# Patient Record
Sex: Female | Born: 1960 | Race: Black or African American | Hispanic: No | Marital: Married | Smoking: Never smoker
Health system: Southern US, Community
[De-identification: ages and names within clinical notes are randomized; demographics above are authoritative.]

## PROBLEM LIST (undated history)

## (undated) DIAGNOSIS — I10 Essential (primary) hypertension: Secondary | ICD-10-CM

## (undated) DIAGNOSIS — J45909 Unspecified asthma, uncomplicated: Secondary | ICD-10-CM

## (undated) HISTORY — PX: COLONOSCOPY: SHX174

## (undated) HISTORY — PX: ABDOMINAL HYSTERECTOMY: SHX81

## (undated) HISTORY — PX: CARPAL TUNNEL RELEASE: SHX101

---

## 2003-12-27 ENCOUNTER — Ambulatory Visit: Payer: Self-pay

## 2006-12-08 ENCOUNTER — Ambulatory Visit: Payer: Self-pay | Admitting: Family Medicine

## 2007-05-11 ENCOUNTER — Ambulatory Visit: Payer: Self-pay | Admitting: Family Medicine

## 2008-05-11 ENCOUNTER — Ambulatory Visit: Payer: Self-pay | Admitting: Family Medicine

## 2009-08-02 ENCOUNTER — Ambulatory Visit: Payer: Self-pay | Admitting: Family Medicine

## 2012-06-15 ENCOUNTER — Emergency Department: Payer: Self-pay | Admitting: Emergency Medicine

## 2013-05-16 ENCOUNTER — Ambulatory Visit: Payer: Self-pay | Admitting: General Practice

## 2014-04-16 ENCOUNTER — Emergency Department: Payer: Self-pay | Admitting: Emergency Medicine

## 2014-04-16 LAB — CBC WITH DIFFERENTIAL/PLATELET
BASOS PCT: 0.9 %
Basophil #: 0 10*3/uL (ref 0.0–0.1)
Eosinophil #: 0.2 10*3/uL (ref 0.0–0.7)
Eosinophil %: 4.3 %
HCT: 44.8 % (ref 35.0–47.0)
HGB: 14.4 g/dL (ref 12.0–16.0)
Lymphocyte #: 1.2 10*3/uL (ref 1.0–3.6)
Lymphocyte %: 24.7 %
MCH: 26.9 pg (ref 26.0–34.0)
MCHC: 32.1 g/dL (ref 32.0–36.0)
MCV: 84 fL (ref 80–100)
Monocyte #: 0.5 x10 3/mm (ref 0.2–0.9)
Monocyte %: 10.1 %
NEUTROS ABS: 3 10*3/uL (ref 1.4–6.5)
Neutrophil %: 60 %
PLATELETS: 255 10*3/uL (ref 150–440)
RBC: 5.34 10*6/uL — ABNORMAL HIGH (ref 3.80–5.20)
RDW: 14.2 % (ref 11.5–14.5)
WBC: 5 10*3/uL (ref 3.6–11.0)

## 2014-04-16 LAB — BASIC METABOLIC PANEL
Anion Gap: 8 (ref 7–16)
BUN: 21 mg/dL — ABNORMAL HIGH (ref 7–18)
Calcium, Total: 9.4 mg/dL (ref 8.5–10.1)
Chloride: 104 mmol/L (ref 98–107)
Co2: 26 mmol/L (ref 21–32)
Creatinine: 1.33 mg/dL — ABNORMAL HIGH (ref 0.60–1.30)
EGFR (Non-African Amer.): 45 — ABNORMAL LOW
GFR CALC AF AMER: 54 — AB
Glucose: 97 mg/dL (ref 65–99)
OSMOLALITY: 279 (ref 275–301)
Potassium: 3.9 mmol/L (ref 3.5–5.1)
SODIUM: 138 mmol/L (ref 136–145)

## 2014-04-16 LAB — URINALYSIS, COMPLETE
Blood: NEGATIVE
GLUCOSE, UR: NEGATIVE mg/dL (ref 0–75)
Hyaline Cast: 7
Nitrite: NEGATIVE
Ph: 5 (ref 4.5–8.0)
Protein: 100
SPECIFIC GRAVITY: 1.031 (ref 1.003–1.030)
Squamous Epithelial: 15
WBC UR: 192 /HPF (ref 0–5)

## 2014-04-16 LAB — TROPONIN I

## 2014-04-18 LAB — URINE CULTURE

## 2014-12-02 ENCOUNTER — Encounter: Payer: Self-pay | Admitting: Emergency Medicine

## 2014-12-02 ENCOUNTER — Emergency Department
Admission: EM | Admit: 2014-12-02 | Discharge: 2014-12-02 | Disposition: A | Discharge status: Home / Self Care | Payer: 59 | Attending: Emergency Medicine | Admitting: Emergency Medicine

## 2014-12-02 ENCOUNTER — Emergency Department: Payer: 59

## 2014-12-02 DIAGNOSIS — Z79899 Other long term (current) drug therapy: Secondary | ICD-10-CM | POA: Diagnosis not present

## 2014-12-02 DIAGNOSIS — I10 Essential (primary) hypertension: Secondary | ICD-10-CM | POA: Diagnosis not present

## 2014-12-02 DIAGNOSIS — R079 Chest pain, unspecified: Secondary | ICD-10-CM | POA: Diagnosis present

## 2014-12-02 DIAGNOSIS — R109 Unspecified abdominal pain: Secondary | ICD-10-CM | POA: Insufficient documentation

## 2014-12-02 DIAGNOSIS — M5441 Lumbago with sciatica, right side: Secondary | ICD-10-CM

## 2014-12-02 HISTORY — DX: Essential (primary) hypertension: I10

## 2014-12-02 HISTORY — DX: Unspecified asthma, uncomplicated: J45.909

## 2014-12-02 LAB — URINALYSIS COMPLETE WITH MICROSCOPIC (ARMC ONLY)
BACTERIA UA: NONE SEEN
Bilirubin Urine: NEGATIVE
Glucose, UA: NEGATIVE mg/dL
HGB URINE DIPSTICK: NEGATIVE
Ketones, ur: NEGATIVE mg/dL
LEUKOCYTES UA: NEGATIVE
NITRITE: NEGATIVE
PROTEIN: NEGATIVE mg/dL
SPECIFIC GRAVITY, URINE: 1.02 (ref 1.005–1.030)
pH: 6 (ref 5.0–8.0)

## 2014-12-02 LAB — COMPREHENSIVE METABOLIC PANEL
ALBUMIN: 3.7 g/dL (ref 3.5–5.0)
ALT: 15 U/L (ref 14–54)
AST: 17 U/L (ref 15–41)
Alkaline Phosphatase: 57 U/L (ref 38–126)
Anion gap: 7 (ref 5–15)
BILIRUBIN TOTAL: 0.4 mg/dL (ref 0.3–1.2)
BUN: 19 mg/dL (ref 6–20)
CHLORIDE: 108 mmol/L (ref 101–111)
CO2: 23 mmol/L (ref 22–32)
Calcium: 8.2 mg/dL — ABNORMAL LOW (ref 8.9–10.3)
Creatinine, Ser: 0.71 mg/dL (ref 0.44–1.00)
GFR calc Af Amer: >60 mL/min (ref >60)
GFR calc non Af Amer: >60 mL/min (ref >60)
GLUCOSE: 91 mg/dL (ref 65–99)
POTASSIUM: 3.7 mmol/L (ref 3.5–5.1)
Sodium: 138 mmol/L (ref 135–145)
Total Protein: 6.7 g/dL (ref 6.5–8.1)

## 2014-12-02 LAB — TROPONIN I: Troponin I: <0.03 ng/mL (ref <0.031)

## 2014-12-02 LAB — CBC
HEMATOCRIT: 40.3 % (ref 35.0–47.0)
Hemoglobin: 13.3 g/dL (ref 12.0–16.0)
MCH: 27.9 pg (ref 26.0–34.0)
MCHC: 33 g/dL (ref 32.0–36.0)
MCV: 84.5 fL (ref 80.0–100.0)
PLATELETS: 233 10*3/uL (ref 150–440)
RBC: 4.76 MIL/uL (ref 3.80–5.20)
RDW: 14.3 % (ref 11.5–14.5)
WBC: 5.8 10*3/uL (ref 3.6–11.0)

## 2014-12-02 LAB — LIPASE, BLOOD: Lipase: 24 U/L (ref 22–51)

## 2014-12-02 MED ORDER — OXYCODONE-ACETAMINOPHEN 5-325 MG PO TABS
1.0000 | ORAL_TABLET | ORAL | Status: AC
  Administered 2014-12-02: 1 via ORAL
  Filled 2014-12-02: qty 1

## 2014-12-02 MED ORDER — OXYCODONE-ACETAMINOPHEN 5-325 MG PO TABS: 1.0000 | ORAL_TABLET | Freq: Four times a day (QID) | ORAL | Status: DC | PRN

## 2014-12-02 MED ORDER — IOHEXOL 350 MG/ML SOLN
125.0000 mL | Freq: Once | INTRAVENOUS | Status: AC | PRN
  Administered 2014-12-02: 125 mL via INTRAVENOUS

## 2014-12-02 MED ORDER — IBUPROFEN 600 MG PO TABS
600.0000 mg | ORAL_TABLET | ORAL | Status: AC
  Administered 2014-12-02: 600 mg via ORAL
  Filled 2014-12-02: qty 1

## 2014-12-02 MED ORDER — PREDNISONE 20 MG PO TABS: 40.0000 mg | ORAL_TABLET | Freq: Every day | ORAL | Status: DC

## 2014-12-02 NOTE — ED Notes (Signed)
Peripheral IV with 20g stick attempted x4, unable to obtain access. Ultrasound assisted access attempted x3 with no success. Pt tolerated well.

## 2014-12-02 NOTE — ED Notes (Signed)
Patient to CT via stretcher.

## 2014-12-02 NOTE — ED Provider Notes (Addendum)
[facility] Emergency Department Provider Note REMINDER - THIS NOTE IS NOT A FINAL MEDICAL RECORD UNTIL IT IS SIGNED. UNTIL THEN, THE CONTENT BELOW MAY REFLECT INFORMATION FROM A DOCUMENTATION TEMPLATE, NOT THE ACTUAL PATIENT VISIT. ____________________________________________  Time seen: Approximately 1:57 PM  I have reviewed the triage vital signs and the nursing notes.   HISTORY  Chief Complaint Flank Pain and Chest Pain    HPI April George is a 54 y.o. female who reports that she woke up this morning about 8:00 with a pain that was in her right lower back that radiates towards her right flank. Then while going to church she noticed that she is starting to develop some tingling and a shooting pain down the right leg with a slight feeling of numbness in the right foot. Denies a cold or blue foot. She denies any nausea or vomiting. No chest pain, fevers or chills. She reports that she is in otherwise good health except for history of high blood pressure and asthma.  She does report that she feels as if the pain started more in her flank and has now moved down towards her right lower back. She continues to notice slight tingling sensation without weakness in the right leg.   Past Medical History  Diagnosis Date  . Hypertension   . Asthma     There are no active problems to display for this patient.   Past Surgical History  Procedure Laterality Date  . Abdominal hysterectomy      Current Outpatient Rx  Name  Route  Sig  Dispense  Refill  . albuterol (PROVENTIL HFA;VENTOLIN HFA) 108 (90 BASE) MCG/ACT inhaler   Inhalation   Inhale 2 puffs into the lungs every 6 (six) hours as needed for wheezing or shortness of breath.         . desloratadine (CLARINEX REDITAB) 5 MG disintegrating tablet   Oral   Take 5 mg by mouth daily as needed.         Marland Kitchen lisinopril (PRINIVIL,ZESTRIL) 20 MG tablet   Oral   Take 20 mg by mouth daily.            Allergies Codeine  History reviewed. No pertinent family history.  Social History Social History  Substance Use Topics  . Smoking status: Never Smoker   . Smokeless tobacco: Never Used  . Alcohol Use: No    Review of Systems Constitutional: No fever/chills Eyes: No visual changes. ENT: No sore throat. Cardiovascular: Denies chest pain. Respiratory: Denies shortness of breath. Gastrointestinal: No abdominal pain but states the pain is kind of in the area of the right flank sort of around her kidney and radiates down towards her lower back and into the right thigh.  No nausea, no vomiting.  No diarrhea.  No constipation. Genitourinary: Negative for dysuria. Musculoskeletal: Negative for back pain. Skin: Negative for rash. Neurological: Negative for headaches, focal weakness. No speech changes. No headache. No slurring of her speech. No facial droop. No numbness weakness or tingling in the arms or left leg. Denies any dense numbness.  10-point ROS otherwise negative.  ____________________________________________   PHYSICAL EXAM:  VITAL SIGNS: ED Triage Vitals  Enc Vitals Group     BP 12/02/14 1226 152/87 mmHg     Pulse Rate 12/02/14 1226 84     Resp 12/02/14 1226 20     Temp 12/02/14 1226 98.6 F (37 C)     Temp Source 12/02/14 1226 Oral  SpO2 12/02/14 1226 95 %     Weight 12/02/14 1226 255 lb (115.667 kg)     Height 12/02/14 1226 5\' 6"  (1.676 m)     Head Cir --      Peak Flow --      Pain Score 12/02/14 1223 10     Pain Loc --      Pain Edu? --      Excl. in [place]? --    Constitutional: Alert and oriented. Well appearing and in no acute distress. Eyes: Conjunctivae are normal. PERRL. EOMI. Head: Atraumatic. Nose: No congestion/rhinnorhea. Mouth/Throat: Mucous membranes are moist.  Oropharynx non-erythematous. Neck: No stridor.   Cardiovascular: Normal rate, regular rhythm. Grossly normal heart sounds.  Good peripheral circulation. Respiratory: Normal  respiratory effort.  No retractions. Lungs CTAB. Gastrointestinal: Soft and nontender. No distention. No abdominal bruits. No CVA tenderness. Previous hysterectomy Musculoskeletal: No lower extremity tenderness nor edema.  No joint effusions. Patient does have moderate tenderness of the mid lower lumbar spine to palpation which does seem to reproduce the pain radiating down the right leg. Neurologic:  Normal speech and language. No gross focal neurologic deficits are appreciated. No gait instability. Skin:  Skin is warm, dry and intact. No rash noted. Psychiatric: Mood and affect are normal. Speech and behavior are normal.  ____________________________________________   LABS (all labs ordered are listed, but only abnormal results are displayed)  Labs Reviewed  COMPREHENSIVE METABOLIC PANEL - Abnormal; Notable for the following:    Calcium 8.2 (*)    All other components within normal limits  URINALYSIS COMPLETEWITH MICROSCOPIC ([facility] ONLY) - Abnormal; Notable for the following:    Color, Urine YELLOW (*)    APPearance CLEAR (*)    Squamous Epithelial / LPF 0-5 (*)    All other components within normal limits  CBC  TROPONIN I  LIPASE, BLOOD   ____________________________________________  EKG  Reviewed and interpreted by me Normal sinus rhythm There is evidence of LVH by voltage criteria There is slight J-point elevation noted in V2 and V3, likely secondary to left ventricular hypertrophy No obvious acute ischemic changes Normal sinus rhythm QTC 450 PR 170 QRS 85 ____________________________________________  RADIOLOGY  IMPRESSION: No acute abnormality. Negative for aortic dissection or acute vascular abnormality. ____________________________________________   PROCEDURES  Procedure(s) performed: None  Critical Care performed: No  ____________________________________________   INITIAL IMPRESSION / ASSESSMENT AND PLAN / ED COURSE  Pertinent labs & imaging  results that were available during my care of the patient were reviewed by me and considered in my medical decision making (see chart for details).  Patient's presentation seems most consistent with back pain, possible herniation versus sciatica given it runs down from the lower lumbar spine in the back leg with some paresthesia in the right foot. She is neurologically intact except notes for a slight decrease in sensation over the right medial foot. She has normal strength. I am somewhat concerned and the fact that she does report that she is having associated flank pain as well, and this is somewhat unusual. Consideration but seemingly far less likely would be aortic dissection or other intra-abdominal process. We'll order a CT angiography to evaluate further. Regarding the back, she has no bowel or bladder incontinence, she has no symptoms of cauda equina.  Chest the patient's CT angiography did not reveal an obvious etiology, I would consider this to be most likely lumbar herniation or disc disease/stenosis possibly causing today's pain.  ----------------------------------------- 4:04 PM on 12/02/2014 -----------------------------------------  Patient reports her pain is improved but still having moderate pain in the lower back. We'll give an additional Percocet, I was able to place a 20-gauge IV under ultrasound guidance using standard technique and precautions into the [facility] without complication. Draws and flushes well.  ----------------------------------------- 5:39 PM on 12/02/2014 -----------------------------------------  Patient's laboratory evaluation and CT scan are very reassuring. Troponin is normal. She reports much improvement after Percocet. No signs of acute need for intervention, I suspect she may have low back pain which is secondary to a possible disc type disease, as her CT is very reassuring. I will discharge the patient to home, provide a brief prescription of Percocet and  advised close follow-up. I did discuss with the patient if she develops any numbness that is dense, weakness in the leg, bowel or bladder problems, or severe increase in pain that she needs to come back to the emergency room right away. She is very agreeable. ____________________________________________   FINAL CLINICAL IMPRESSION(S) / ED DIAGNOSES  Final diagnoses:  None      Delman Kitten, MD 12/02/14 1741  I will prescribe the patient a narcotic pain medicine due to their condition which I anticipate will cause at least moderate pain short term. I discussed with the patient safe use of narcotic pain medicines, and that they are not to drive, work in dangerous areas, or ever take more than prescribed (no more than 1 pill every 6 hours). We discussed that this is the type of medication that "Alfonse Spruce" may have overdosed on and the risks of this type of medicine. Patient is very agreeable to only use as prescribed and to never use more than prescribed.   Delman Kitten, MD 12/02/14 [id number]

## 2014-12-02 NOTE — Discharge Instructions (Signed)
You have been seen in the Emergency Department (ED)  today for back pain.  Your workup and exam have not shown any acute abnormalities and you are likely suffering from muscle strain or possible problems with your discs, but there is no treatment that will fix your symptoms at this time.  Please take Motrin (ibuprofen) as needed for your pain according to the instructions written on the box.  Alternatively, for the next five days you can take 600mg  three times daily with meals (it may upset your stomach).  Take percocet as prescribed for severe pain. Do not drink alcohol, drive or participate in any other potentially dangerous activities while taking this medication as it may make you sleepy. Do not take this medication with any other sedating medications, either prescription or over-the-counter. If you were prescribed Percocet or Vicodin, do not take these with acetaminophen (Tylenol) as it is already contained within these medications.   This medication is an opiate (or narcotic) pain medication and can be habit forming.  Use it as little as possible to achieve adequate pain control.  Do not use or use it with extreme caution if you have a history of opiate abuse or dependence.  If you are on a pain contract with your primary care doctor or a pain specialist, be sure to let them know you were prescribed this medication today from the [facility] Emergency Department.  This medication is intended for your use only - do not give any to anyone else and keep it in a secure place where nobody else, especially children, have access to it.  It will also cause or worsen constipation, so you may want to consider taking an over-the-counter stool softener while you are taking this medication.  Please follow up with your doctor as soon as possible regarding today's ED visit and your back pain.  Return to the ED for worsening back pain, fever, weakness or numbness of either leg, or if you develop either (1) an  inability to urinate or have bowel movements, or (2) loss of your ability to control your bathroom functions (if you start having "accidents"), or if you develop other new symptoms that concern you.   Radicular Pain Radicular pain in either the arm or leg is usually from a bulging or herniated disk in the spine. A piece of the herniated disk may press against the nerves as the nerves exit the spine. This causes pain which is felt at the tips of the nerves down the arm or leg. Other causes of radicular pain may include:  Fractures.  Heart disease.  Cancer.  An abnormal and usually degenerative state of the nervous system or nerves (neuropathy). Diagnosis may require CT or MRI scanning to determine the primary cause.  Nerves that start at the neck (nerve roots) may cause radicular pain in the outer shoulder and arm. It can spread down to the thumb and fingers. The symptoms vary depending on which nerve root has been affected. In most cases radicular pain improves with conservative treatment. Neck problems may require physical therapy, a neck collar, or cervical traction. Treatment may take many weeks, and surgery may be considered if the symptoms do not improve.  Conservative treatment is also recommended for sciatica. Sciatica causes pain to radiate from the lower back or buttock area down the leg into the foot. Often there is a history of back problems. Most patients with sciatica are better after 2 to 4 weeks of rest and other supportive care. Short  term bed rest can reduce the disk pressure considerably. Sitting, however, is not a good position since this increases the pressure on the disk. You should avoid bending, lifting, and all other activities which make the problem worse. Traction can be used in severe cases. Surgery is usually reserved for patients who do not improve within the first months of treatment. Only take over-the-counter or prescription medicines for pain, discomfort, or fever as  directed by your caregiver. Narcotics and muscle relaxants may help by relieving more severe pain and spasm and by providing mild sedation. Cold or massage can give significant relief. Spinal manipulation is not recommended. It can increase the degree of disc protrusion. Epidural steroid injections are often effective treatment for radicular pain. These injections deliver medicine to the spinal nerve in the space between the protective covering of the spinal cord and back bones (vertebrae). Your caregiver can give you more information about steroid injections. These injections are most effective when given within two weeks of the onset of pain.  You should see your caregiver for follow up care as recommended. A program for neck and back injury rehabilitation with stretching and strengthening exercises is an important part of management.  SEEK IMMEDIATE MEDICAL CARE IF:  You develop increased pain, weakness, or numbness in your arm or leg.  You develop difficulty with bladder or bowel control.  You develop abdominal pain. Document Released: 04/16/2004 Document Revised: 06/01/2011 Document Reviewed: 07/02/2008 [facility] Patient Information 2015 [place], [place]. This information is not intended to replace advice given to you by your health care provider. Make sure you discuss any questions you have with your health care provider.

## 2014-12-02 NOTE — ED Notes (Signed)
Pt c/o R sided flank pain that started at approx 0800. Pt states pain radiates to back and into chest. Pt alert and oriented at this time. Denies hx heart disease, denies hx of kidney stones. Pt tense and unable to get comfortable in triage. Pt alert and oriented at this time.

## 2016-02-24 ENCOUNTER — Other Ambulatory Visit: Payer: Self-pay | Admitting: Nurse Practitioner

## 2016-02-24 DIAGNOSIS — Z1231 Encounter for screening mammogram for malignant neoplasm of breast: Secondary | ICD-10-CM

## 2016-04-01 ENCOUNTER — Ambulatory Visit
Admission: RE | Admit: 2016-04-01 | Discharge: 2016-04-01 | Disposition: A | Discharge status: Home / Self Care | Payer: 59 | Source: Ambulatory Visit | Attending: Nurse Practitioner | Admitting: Nurse Practitioner

## 2016-04-01 DIAGNOSIS — Z1231 Encounter for screening mammogram for malignant neoplasm of breast: Secondary | ICD-10-CM | POA: Diagnosis not present

## 2016-05-29 ENCOUNTER — Other Ambulatory Visit: Payer: Self-pay | Admitting: Nurse Practitioner

## 2016-05-29 ENCOUNTER — Ambulatory Visit: Payer: 59

## 2016-05-29 DIAGNOSIS — M542 Cervicalgia: Secondary | ICD-10-CM

## 2016-05-29 DIAGNOSIS — M25511 Pain in right shoulder: Secondary | ICD-10-CM

## 2016-06-02 ENCOUNTER — Ambulatory Visit
Admission: RE | Admit: 2016-06-02 | Discharge: 2016-06-02 | Disposition: A | Discharge status: Home / Self Care | Payer: 59 | Source: Ambulatory Visit | Attending: Nurse Practitioner | Admitting: Nurse Practitioner

## 2016-06-02 DIAGNOSIS — M542 Cervicalgia: Secondary | ICD-10-CM | POA: Diagnosis present

## 2016-06-02 DIAGNOSIS — M19011 Primary osteoarthritis, right shoulder: Secondary | ICD-10-CM | POA: Insufficient documentation

## 2016-06-02 DIAGNOSIS — M25511 Pain in right shoulder: Secondary | ICD-10-CM | POA: Diagnosis present

## 2016-09-11 IMAGING — CT CT CTA ABD/PEL W/CM AND/OR W/O CM
1 series · 15 of 32 positions shown, 19 images · IV contrast (omnipaque)
Comparison: None.

CLINICAL DATA: Low back and RIGHT flank pain. Paresthesias of the
RIGHT foot. Aortic dissection.

EXAM:
CTA ABDOMEN AND PELVIS WITHOUT CONTRAST
TECHNIQUE: Multidetector CT imaging of the abdomen and pelvis was performed
using the standard protocol during bolus administration of
intravenous contrast. Multiplanar reconstructed images and MIPs were
obtained and reviewed to evaluate the vascular anatomy.
CONTRAST:  125mL OMNIPAQUE IOHEXOL 350 MG/ML SOLN

[Series 4: cta abdpel · axial · 0.76mm/px · z∈[-378,+40]mm · 15 of 232 slices shown, 19 images]
[im 15/232  soft-tissue]
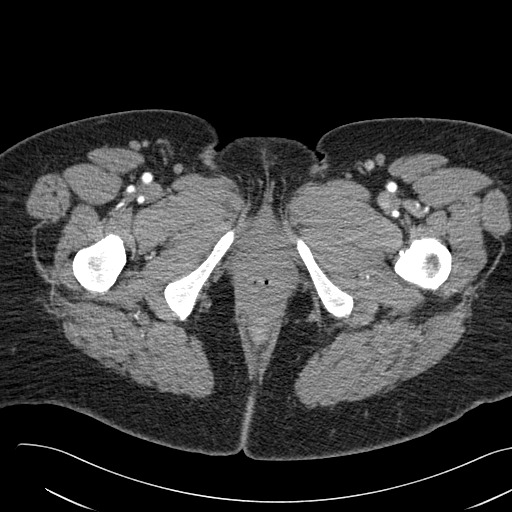
[im 15/232  bone]
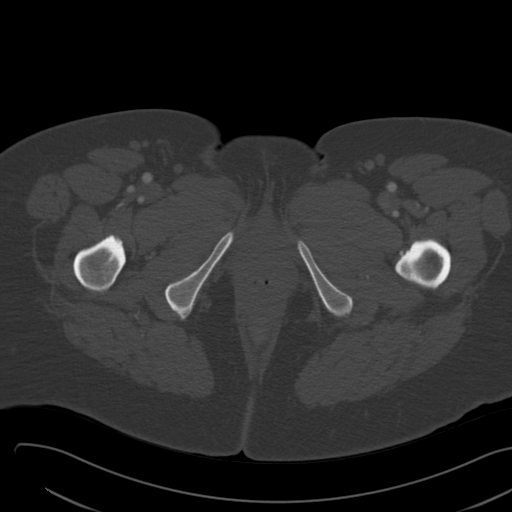
[im 30/232  soft-tissue]
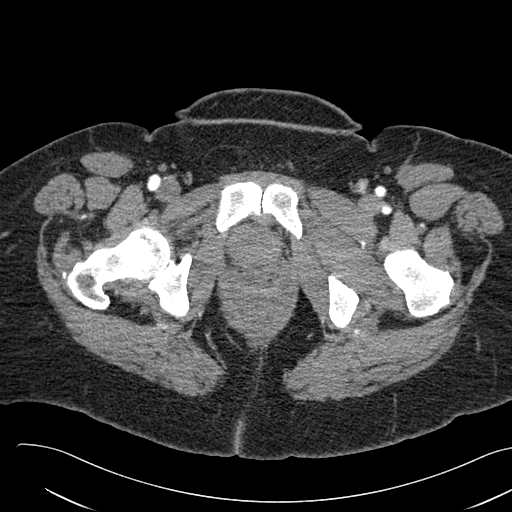
[im 45/232  soft-tissue]
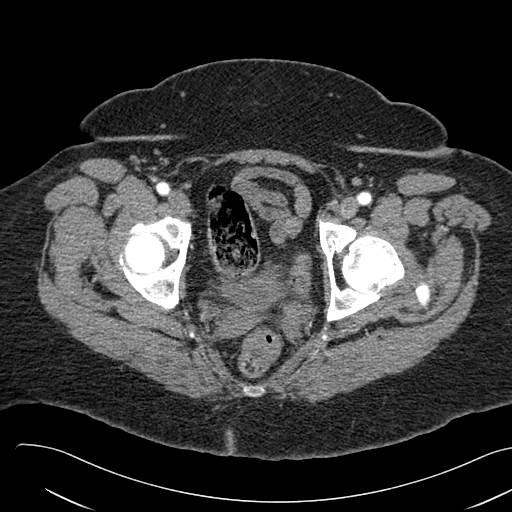
[im 68/232  soft-tissue]
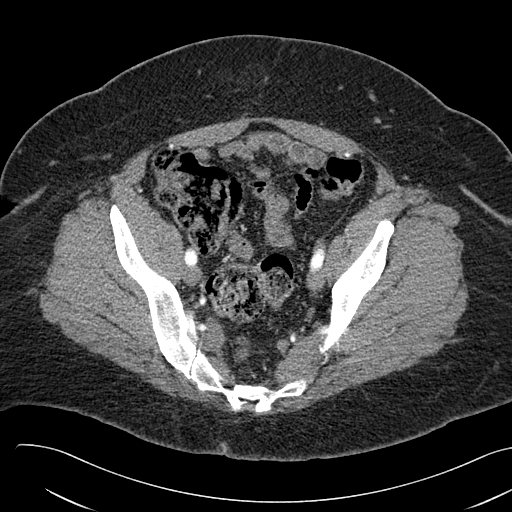
[im 82/232  soft-tissue]
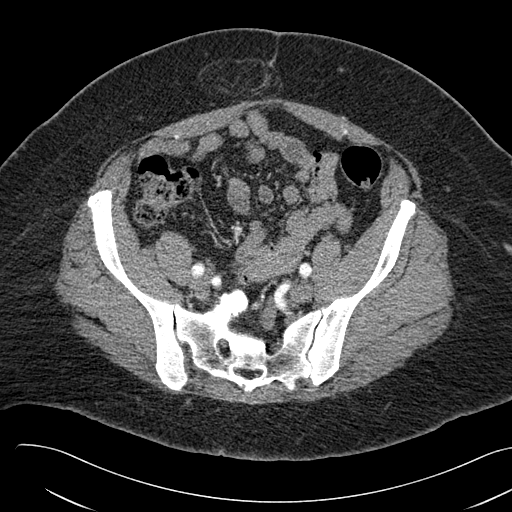
[im 97/232  soft-tissue]
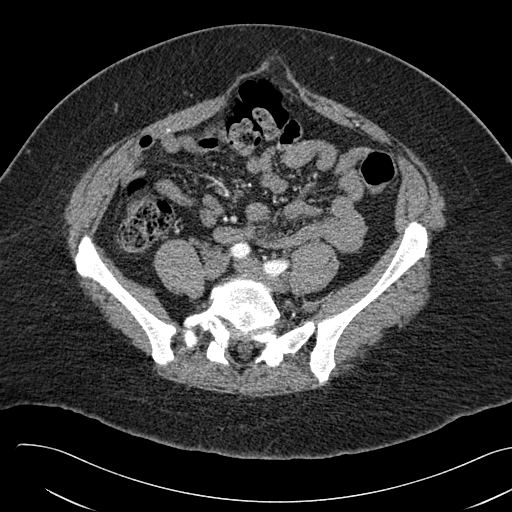
[im 120/232  soft-tissue]
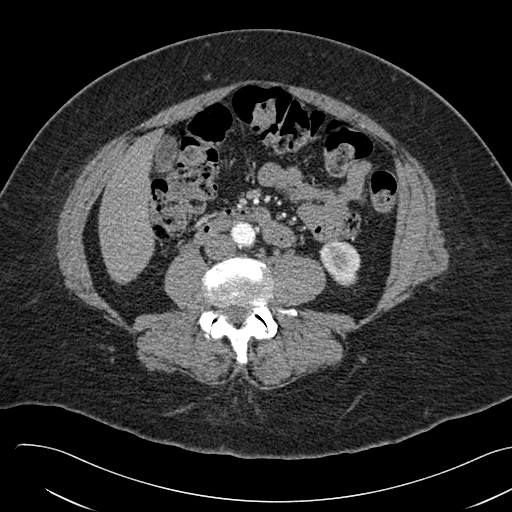
[im 135/232  soft-tissue]
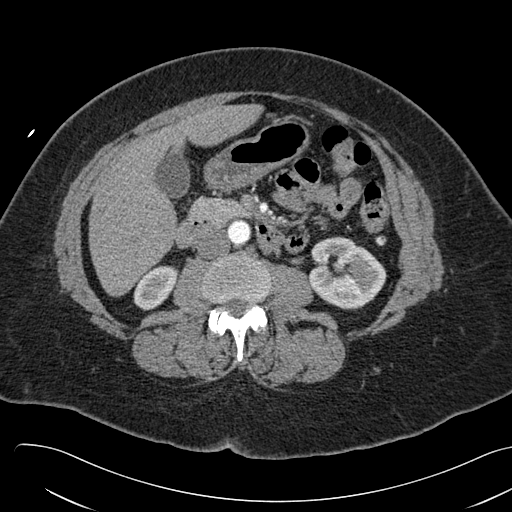
[im 150/232  soft-tissue]
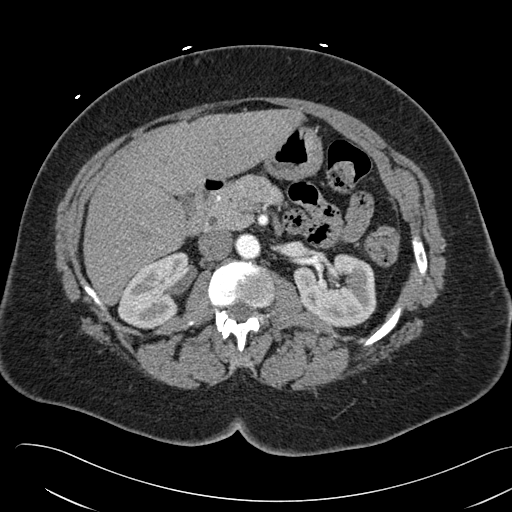
[im 150/232  bone]
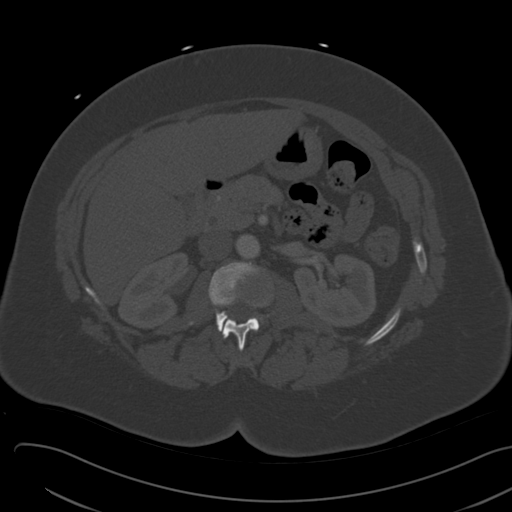
[im 164/232  soft-tissue]
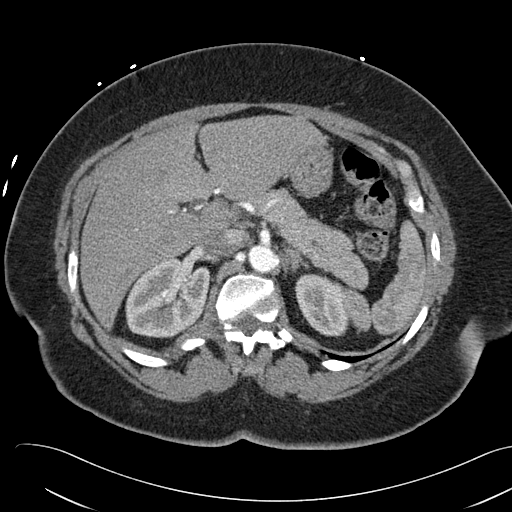
[im 187/232  soft-tissue]
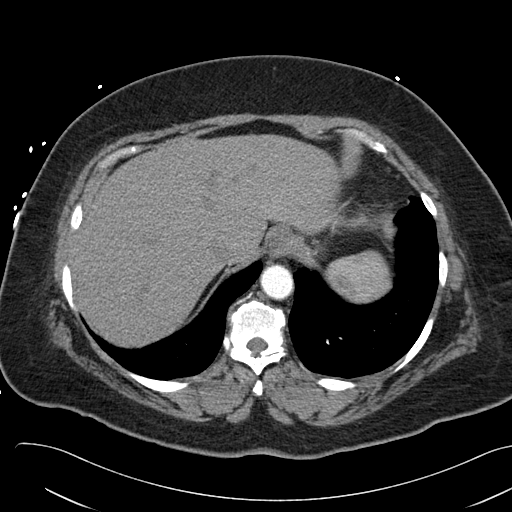
[im 202/232  soft-tissue]
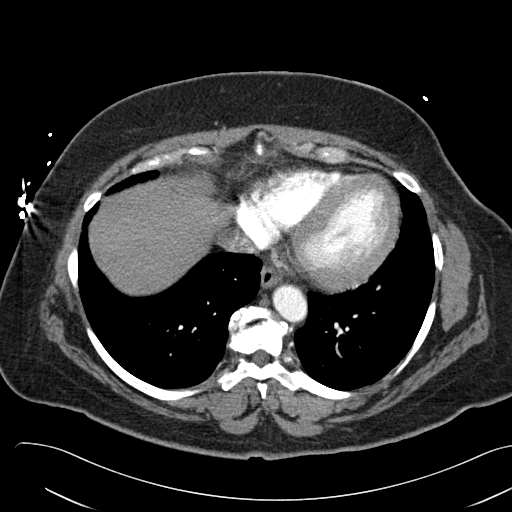
[im 202/232  lung]
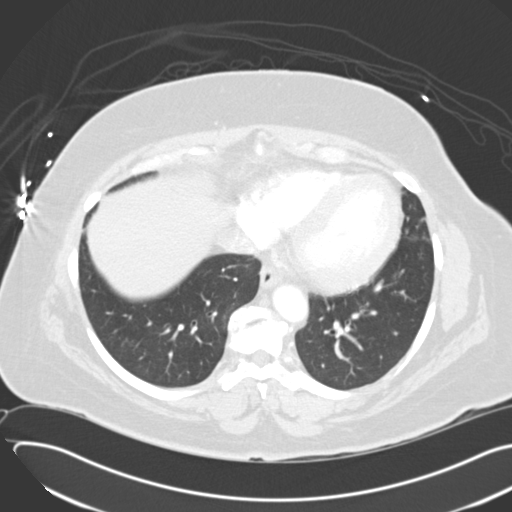
[im 209/232  lung]
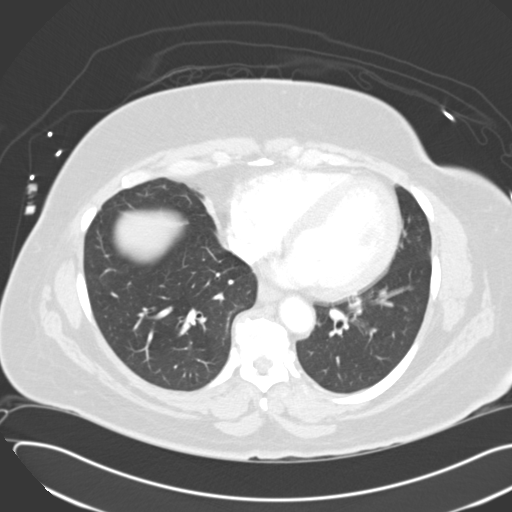
[im 217/232  soft-tissue]
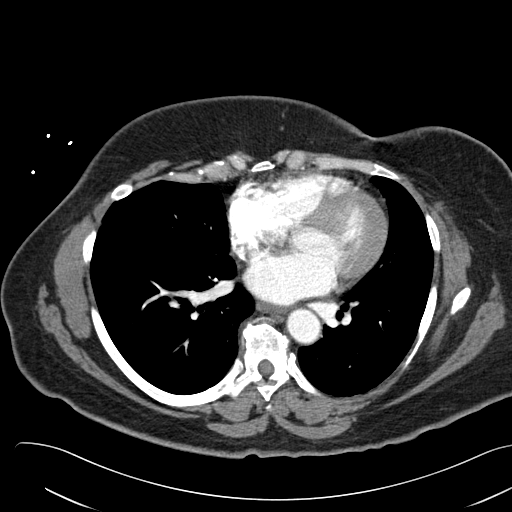
[im 217/232  lung]
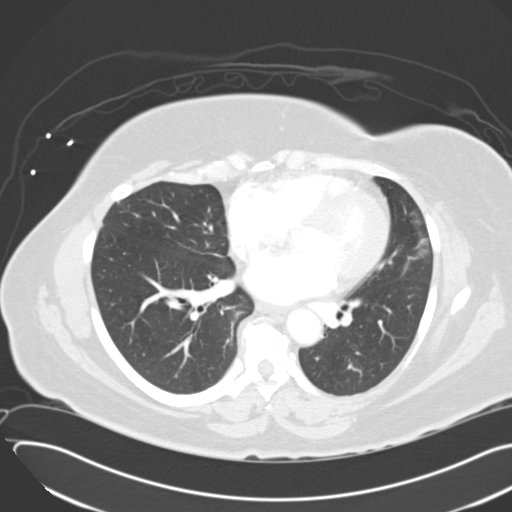
[im 224/232  lung]
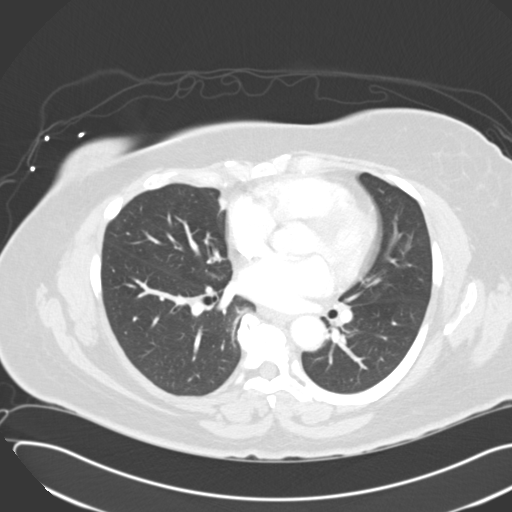

[15 of 32 positions shown; findings below may reference images not displayed]

FINDINGS: Musculoskeletal: Thoracolumbar spondylosis is moderate to severe.
Chronic RIGHT and healed LEFT L5 pars defects. No aggressive osseous
lesions. Dextroconvex thoracolumbar scoliosis.

Lung Bases: Scarring or atelectasis in the lingula.

Liver:  Normal.

Spleen:  Normal.

Gallbladder:  Normal.

Common bile duct:  Normal.

Pancreas:  Normal.

Adrenal glands:  Normal.

Kidneys: Normal enhancement. No calculi. LEFT ureter normal. RIGHT
ureter normal.

Stomach:  Grossly normal, collapsed.

Small bowel:  Normal.

Colon: Normal appendix. No inflammatory changes or obstruction of
the colon. Distal colonic diverticulosis without diverticulitis.

Pelvic Genitourinary: Hysterectomy. Urinary bladder appears normal.

Peritoneum: No free fluid or free air.

Vascular/lymphatic: Normal. Specifically, no acute vascular
abnormality. Negative for aortic dissection.

Body Wall: Fat containing RIGHT inguinal hernia and fat containing
periumbilical hernia.

Review of the MIP images confirms the above findings.
IMPRESSION: No acute abnormality. Negative for aortic dissection or acute
vascular abnormality.

## 2017-06-28 ENCOUNTER — Other Ambulatory Visit: Payer: Self-pay

## 2017-06-28 MED ORDER — LISINOPRIL 20 MG PO TABS: 20.0000 mg | ORAL_TABLET | Freq: Every day | ORAL | 5 refills | Status: DC

## 2017-11-19 ENCOUNTER — Ambulatory Visit: Payer: Managed Care, Other (non HMO) | Admitting: Nurse Practitioner

## 2017-11-19 ENCOUNTER — Encounter: Payer: Self-pay | Admitting: Nurse Practitioner

## 2017-11-19 VITALS — BP 168/104 | HR 91 | Resp 16 | Ht 64.0 in | Wt 291.4 lb

## 2017-11-19 DIAGNOSIS — H1013 Acute atopic conjunctivitis, bilateral: Secondary | ICD-10-CM

## 2017-11-19 DIAGNOSIS — J45909 Unspecified asthma, uncomplicated: Secondary | ICD-10-CM | POA: Insufficient documentation

## 2017-11-19 DIAGNOSIS — J309 Allergic rhinitis, unspecified: Secondary | ICD-10-CM

## 2017-11-19 DIAGNOSIS — I10 Essential (primary) hypertension: Secondary | ICD-10-CM | POA: Diagnosis not present

## 2017-11-19 DIAGNOSIS — J019 Acute sinusitis, unspecified: Secondary | ICD-10-CM | POA: Diagnosis not present

## 2017-11-19 MED ORDER — DESLORATADINE 5 MG PO TBDP: 5.0000 mg | ORAL_TABLET | Freq: Every day | ORAL | 5 refills | Status: DC | PRN

## 2017-11-19 MED ORDER — OLOPATADINE HCL 0.2 % OP SOLN: 1.0000 [drp] | Freq: Every day | OPHTHALMIC | 5 refills | Status: DC

## 2017-11-19 MED ORDER — AMOXICILLIN 875 MG PO TABS: 875.0000 mg | ORAL_TABLET | Freq: Two times a day (BID) | ORAL | 0 refills | Status: DC

## 2017-11-19 MED ORDER — LISINOPRIL 20 MG PO TABS: 20.0000 mg | ORAL_TABLET | Freq: Every day | ORAL | 5 refills | Status: DC

## 2017-11-19 NOTE — Progress Notes (Signed)
[facility] [place], [place] [address]  Internal MEDICINE  Office Visit Note  Patient Name: April George  [id number]  [id number]  Date of Service: 12/01/2017   Pt is here for a sick visit.  Chief Complaint  Patient presents with  . Hypertension    has been out of blood pressure medication for 2 months.   . Sinusitis    severe, contribute to episode of vertigo, near loss of consiousness.      Hypertension  This is a chronic problem. The current episode started more than 1 year ago. The problem is unchanged. The problem is uncontrolled (currently off blood pressure medication). Associated symptoms include headaches. Pertinent negatives include no chest pain, palpitations or shortness of breath. There are no associated agents to hypertension. Risk factors for coronary artery disease include obesity and post-menopausal state. Past treatments include ACE inhibitors. The current treatment provides moderate improvement.  Sinusitis  This is a new problem. The current episode started 1 to 4 weeks ago. The problem has been gradually worsening since onset. There has been no fever. The fever has been present for less than 1 day. Her pain is at a severity of 2/10. The pain is moderate. Associated symptoms include chills, congestion, coughing, ear pain, headaches, a hoarse voice, sinus pressure, sneezing, a sore throat and swollen glands. Pertinent negatives include no shortness of breath. Past treatments include oral decongestants and acetaminophen. The treatment provided mild relief.        Current Medication:  Outpatient Encounter Medications as of 11/19/2017  Medication Sig Note  . albuterol (PROVENTIL HFA;VENTOLIN HFA) 108 (90 BASE) MCG/ACT inhaler Inhale 2 puffs into the lungs every 6 (six) hours as needed for wheezing or shortness of breath.   . desloratadine (CLARINEX REDITAB) 5 MG disintegrating tablet Take 1 tablet (5 mg total) by mouth daily as needed.   Marland Kitchen  lisinopril (PRINIVIL,ZESTRIL) 20 MG tablet Take 1 tablet (20 mg total) by mouth daily.   Marland Kitchen oxyCODONE-acetaminophen (ROXICET) 5-325 MG per tablet Take 1 tablet by mouth every 6 (six) hours as needed for severe pain.   . [DISCONTINUED] desloratadine (CLARINEX REDITAB) 5 MG disintegrating tablet Take 5 mg by mouth daily as needed. 12/02/2014: Received from: External Pharmacy Received Sig:   . [DISCONTINUED] lisinopril (PRINIVIL,ZESTRIL) 20 MG tablet Take 1 tablet (20 mg total) by mouth daily.   Marland Kitchen amoxicillin (AMOXIL) 875 MG tablet Take 1 tablet (875 mg total) by mouth 2 (two) times daily.   . Olopatadine HCl 0.2 % SOLN Apply 1 drop to eye daily.   . [DISCONTINUED] predniSONE (DELTASONE) 20 MG tablet Take 2 tablets (40 mg total) by mouth daily with breakfast. (Patient not taking: Reported on 11/19/2017)    No facility-administered encounter medications on file as of 11/19/2017.       Medical History: Past Medical History:  Diagnosis Date  . Asthma   . Hypertension      Today's Vitals   11/19/17 1547  BP: (!) 168/104  Pulse: 91  Resp: 16  SpO2: 96%  Weight: 291 lb 6.4 oz (132.2 kg)  Height: 5\' 4"  (1.626 m)   Review of Systems  Constitutional: Positive for chills and fatigue. Negative for activity change.  HENT: Positive for congestion, ear pain, hoarse voice, postnasal drip, rhinorrhea, sinus pressure, sinus pain, sneezing, sore throat and voice change.   Eyes: Negative.   Respiratory: Positive for cough and wheezing. Negative for chest tightness and shortness of breath.   Cardiovascular: Negative for  chest pain and palpitations.       Elevated blood pressure.   Gastrointestinal: Negative for constipation, diarrhea, nausea and vomiting.  Endocrine: Negative.  Negative for cold intolerance, heat intolerance, polydipsia, polyphagia and polyuria.  Genitourinary: Negative.   Musculoskeletal: Positive for arthralgias and myalgias.  Skin: Negative.   Allergic/Immunologic: Positive for  environmental allergies.  Neurological: Positive for dizziness and headaches.  Psychiatric/Behavioral: Negative.     Physical Exam  Constitutional: She is oriented to person, place, and time. She appears well-developed and well-nourished. She appears ill. No distress.  HENT:  Head: Normocephalic and atraumatic.  Right Ear: Tympanic membrane is bulging.  Left Ear: Tympanic membrane is bulging.  Nose: Mucosal edema and rhinorrhea present. Right sinus exhibits maxillary sinus tenderness and frontal sinus tenderness. Left sinus exhibits maxillary sinus tenderness and frontal sinus tenderness.  Mouth/Throat: Posterior oropharyngeal erythema present. No oropharyngeal exudate.  Copious post nasal drip evident.   Eyes: Pupils are equal, round, and reactive to light. EOM are normal.  Mild edema present around the orbits of both eyes.   Neck: Normal range of motion. Neck supple. No JVD present. No tracheal deviation present. No thyromegaly present.  Cardiovascular: Normal rate, regular rhythm and normal heart sounds. Exam reveals no gallop and no friction rub.  No murmur heard. Pulmonary/Chest: Effort normal and breath sounds normal. No respiratory distress. She has no wheezes. She has no rales. She exhibits no tenderness.  Abdominal: Soft. Bowel sounds are normal. There is no tenderness.  Musculoskeletal: Normal range of motion.  Lymphadenopathy:    She has cervical adenopathy.  Neurological: She is alert and oriented to person, place, and time. No cranial nerve deficit.  Skin: Skin is warm and dry. Capillary refill takes less than 2 seconds. She is not diaphoretic.  Psychiatric: She has a normal mood and affect. Her behavior is normal. Judgment and thought content normal.  Nursing note and vitals reviewed.  Assessment/Plan: 1. Hypertension, unspecified type Restart lisinopril 20mg  daily. Monitor closely. - lisinopril (PRINIVIL,ZESTRIL) 20 MG tablet; Take 1 tablet (20 mg total) by mouth daily.   Dispense: 30 tablet; Refill: 5  2. Acute non-recurrent sinusitis, unspecified location Start amoxicillin 875mg  twice daily for 10 days. Rest and increase fluids. Take OTC medications to alleviate symptoms. - amoxicillin (AMOXIL) 875 MG tablet; Take 1 tablet (875 mg total) by mouth 2 (two) times daily.  Dispense: 20 tablet; Refill: 0  3. Allergic conjunctivitis of both eyes pataday eye drops. Use 1 deop in both eyes daily.  - Olopatadine HCl 0.2 % SOLN; Apply 1 drop to eye daily.  Dispense: 2.5 mL; Refill: 5  4. Allergic rhinitis, unspecified seasonality, unspecified trigger Start clarinex daily. Avoid triggers.  - desloratadine (CLARINEX REDITAB) 5 MG disintegrating tablet; Take 1 tablet (5 mg total) by mouth daily as needed.  Dispense: 30 tablet; Refill: 5  General Counseling: April George verbalizes understanding of the findings of todays visit and agrees with plan of treatment. I have discussed any further diagnostic evaluation that may be needed or ordered today. We also reviewed her medications today. she has been encouraged to call the office with any questions or concerns that should arise related to todays visit.    Counseling:  Hypertension Counseling:   The following hypertensive lifestyle modification were recommended and discussed:  1. Limiting alcohol intake to less than 1 oz/day of ethanol:(24 oz of beer or 8 oz of wine or 2 oz of 100-proof whiskey). 2. Take baby ASA 81 mg daily. 3. Importance of regular  aerobic exercise and losing weight. 4. Reduce dietary saturated fat and cholesterol intake for overall cardiovascular health. 5. Maintaining adequate dietary potassium, calcium, and magnesium intake. 6. Regular monitoring of the blood pressure. 7. Reduce sodium intake to less than 100 mmol/day (less than 2.3 gm of sodium or less than 6 gm of sodium choride)   Rest and increase fluids. Continue using OTC medication to control symptoms.   This patient was seen by [place] with Dr Lavera Guise as a part of collaborative care agreement  Meds ordered this encounter  Medications  . lisinopril (PRINIVIL,ZESTRIL) 20 MG tablet    Sig: Take 1 tablet (20 mg total) by mouth daily.    Dispense:  30 tablet    Refill:  5    Order Specific Question:   Supervising Provider    Answer:   Lavera Guise [[id number]]  . desloratadine (CLARINEX REDITAB) 5 MG disintegrating tablet    Sig: Take 1 tablet (5 mg total) by mouth daily as needed.    Dispense:  30 tablet    Refill:  5    Order Specific Question:   Supervising Provider    Answer:   Lavera Guise [[id number]]  . Olopatadine HCl 0.2 % SOLN    Sig: Apply 1 drop to eye daily.    Dispense:  2.5 mL    Refill:  5    Order Specific Question:   Supervising Provider    Answer:   Lavera Guise [[id number]]  . amoxicillin (AMOXIL) 875 MG tablet    Sig: Take 1 tablet (875 mg total) by mouth 2 (two) times daily.    Dispense:  20 tablet    Refill:  0    Order Specific Question:   Supervising Provider    Answer:   Lavera Guise [[id number]]    Time spent: 20 Minutes

## 2017-11-25 ENCOUNTER — Telehealth: Payer: Self-pay

## 2017-11-25 ENCOUNTER — Other Ambulatory Visit: Payer: Self-pay | Admitting: Nurse Practitioner

## 2017-11-25 DIAGNOSIS — J019 Acute sinusitis, unspecified: Secondary | ICD-10-CM

## 2017-11-25 MED ORDER — AZITHROMYCIN 250 MG PO TABS: ORAL_TABLET | ORAL | 0 refills | Status: DC

## 2017-11-25 MED ORDER — PREDNISONE 10 MG (21) PO TBPK: ORAL_TABLET | ORAL | 0 refills | Status: DC

## 2017-11-25 NOTE — Progress Notes (Signed)
Will do round of z-pack for 5 days and prednisone taper for 6 days. Both were sent to her pharmacy.

## 2017-11-25 NOTE — Telephone Encounter (Signed)
Will do round of z-pack for 5 days and prednisone taper for 6 days. Both were sent to her pharmacy.

## 2017-11-25 NOTE — Telephone Encounter (Signed)
Pt advised we send antibiotic and prednisone to phar

## 2017-12-01 DIAGNOSIS — H1013 Acute atopic conjunctivitis, bilateral: Secondary | ICD-10-CM | POA: Insufficient documentation

## 2017-12-01 DIAGNOSIS — J309 Allergic rhinitis, unspecified: Secondary | ICD-10-CM | POA: Insufficient documentation

## 2017-12-01 DIAGNOSIS — J019 Acute sinusitis, unspecified: Secondary | ICD-10-CM | POA: Insufficient documentation

## 2017-12-10 ENCOUNTER — Ambulatory Visit: Payer: Self-pay | Admitting: Nurse Practitioner

## 2017-12-22 ENCOUNTER — Encounter: Payer: Self-pay | Admitting: Nurse Practitioner

## 2018-01-14 ENCOUNTER — Ambulatory Visit: Payer: Managed Care, Other (non HMO) | Admitting: Adult Health

## 2018-01-14 ENCOUNTER — Encounter: Payer: Self-pay | Admitting: Adult Health

## 2018-01-14 VITALS — BP 148/92 | HR 83 | Resp 16 | Ht 66.0 in | Wt 295.0 lb

## 2018-01-14 DIAGNOSIS — J4520 Mild intermittent asthma, uncomplicated: Secondary | ICD-10-CM

## 2018-01-14 DIAGNOSIS — Z0289 Encounter for other administrative examinations: Secondary | ICD-10-CM | POA: Diagnosis not present

## 2018-01-14 DIAGNOSIS — Z1211 Encounter for screening for malignant neoplasm of colon: Secondary | ICD-10-CM

## 2018-01-14 DIAGNOSIS — I10 Essential (primary) hypertension: Secondary | ICD-10-CM

## 2018-01-14 NOTE — Patient Instructions (Signed)

## 2018-01-14 NOTE — Progress Notes (Signed)
[facility] [place], [place] [address]  Internal MEDICINE  Office Visit Note  Patient Name: April George  [id number]  [id number]  Date of Service: 01/14/2018  Chief Complaint  Patient presents with  . Hypertension  . Quality Metric Gaps    colonoscopy    HPI Pt here for follow-up on hypertension asthma.  Patient is also in need of her healthcare exemption form for [facility].  She is required to have this filled out due to her BMI.  She also has need for colonoscopy to close her quality metric gaps.  She denies any current issues with her blood pressure or asthma.  She uses a rescue inhaler as needed, and cannot remember the last time she needed her inhaler.   Current Medication: Outpatient Encounter Medications as of 01/14/2018  Medication Sig  . albuterol (PROVENTIL HFA;VENTOLIN HFA) 108 (90 BASE) MCG/ACT inhaler Inhale 2 puffs into the lungs every 6 (six) hours as needed for wheezing or shortness of breath.  . desloratadine (CLARINEX REDITAB) 5 MG disintegrating tablet Take 1 tablet (5 mg total) by mouth daily as needed.  Marland Kitchen lisinopril (PRINIVIL,ZESTRIL) 20 MG tablet Take 1 tablet (20 mg total) by mouth daily.  . [DISCONTINUED] amoxicillin (AMOXIL) 875 MG tablet Take 1 tablet (875 mg total) by mouth 2 (two) times daily. (Patient not taking: Reported on 01/14/2018)  . [DISCONTINUED] azithromycin (ZITHROMAX) 250 MG tablet z-pack - take as directed for 5 days (Patient not taking: Reported on 01/14/2018)  . [DISCONTINUED] Olopatadine HCl 0.2 % SOLN Apply 1 drop to eye daily. (Patient not taking: Reported on 01/14/2018)  . [DISCONTINUED] oxyCODONE-acetaminophen (ROXICET) 5-325 MG per tablet Take 1 tablet by mouth every 6 (six) hours as needed for severe pain. (Patient not taking: Reported on 01/14/2018)  . [DISCONTINUED] predniSONE (STERAPRED UNI-PAK 21 TAB) 10 MG (21) TBPK tablet 6 day taper - take by mouth as directed for 6 days (Patient not taking: Reported on  01/14/2018)   No facility-administered encounter medications on file as of 01/14/2018.     Surgical History: Past Surgical History:  Procedure Laterality Date  . ABDOMINAL HYSTERECTOMY      Medical History: Past Medical History:  Diagnosis Date  . Asthma   . Hypertension     Family History: Family History  Problem Relation Age of Onset  . Breast cancer Mother 75  . Breast cancer Cousin     Social History   Socioeconomic History  . Marital status: Married    Spouse name: Not on file  . Number of children: Not on file  . Years of education: Not on file  . Highest education level: Not on file  Occupational History  . Not on file  Social Needs  . Financial resource strain: Not on file  . Food insecurity:    Worry: Not on file    Inability: Not on file  . Transportation needs:    Medical: Not on file    Non-medical: Not on file  Tobacco Use  . Smoking status: Never Smoker  . Smokeless tobacco: Never Used  Substance and Sexual Activity  . Alcohol use: No  . Drug use: No  . Sexual activity: Not on file  Lifestyle  . Physical activity:    Days per week: Not on file    Minutes per session: Not on file  . Stress: Not on file  Relationships  . Social connections:    Talks on phone: Not on file    Gets together:  Not on file    Attends religious service: Not on file    Active member of club or organization: Not on file    Attends meetings of clubs or organizations: Not on file    Relationship status: Not on file  . Intimate partner violence:    Fear of current or ex partner: Not on file    Emotionally abused: Not on file    Physically abused: Not on file    Forced sexual activity: Not on file  Other Topics Concern  . Not on file  Social History Narrative  . Not on file      Review of Systems  Constitutional: Negative for chills, fatigue and unexpected weight change.  HENT: Negative for congestion, rhinorrhea, sneezing and sore throat.   Eyes: Negative  for photophobia, pain and redness.  Respiratory: Negative for cough, chest tightness and shortness of breath.   Cardiovascular: Negative for chest pain and palpitations.  Gastrointestinal: Negative for abdominal pain, constipation, diarrhea, nausea and vomiting.  Endocrine: Negative.   Genitourinary: Negative for dysuria and frequency.  Musculoskeletal: Negative for arthralgias, back pain, joint swelling and neck pain.  Skin: Negative for rash.  Allergic/Immunologic: Negative.   Neurological: Negative for tremors and numbness.  Hematological: Negative for adenopathy. Does not bruise/bleed easily.  Psychiatric/Behavioral: Negative for behavioral problems and sleep disturbance. The patient is not nervous/anxious.     Vital Signs: BP (!) 148/92   Pulse 83   Resp 16   Ht 5\' 6"  (1.676 m)   Wt 295 lb (133.8 kg)   SpO2 94%   BMI 47.61 kg/m    Physical Exam  Constitutional: She is oriented to person, place, and time. She appears well-developed and well-nourished. No distress.  HENT:  Head: Normocephalic and atraumatic.  Mouth/Throat: Oropharynx is clear and moist. No oropharyngeal exudate.  Eyes: Pupils are equal, round, and reactive to light. EOM are normal.  Neck: Normal range of motion. Neck supple. No JVD present. No tracheal deviation present. No thyromegaly present.  Cardiovascular: Normal rate, regular rhythm and normal heart sounds. Exam reveals no gallop and no friction rub.  No murmur heard. Pulmonary/Chest: Effort normal and breath sounds normal. No respiratory distress. She has no wheezes. She has no rales. She exhibits no tenderness.  Abdominal: Soft. There is no tenderness. There is no guarding.  Musculoskeletal: Normal range of motion.  Lymphadenopathy:    She has no cervical adenopathy.  Neurological: She is alert and oriented to person, place, and time. No cranial nerve deficit.  Skin: Skin is warm and dry. She is not diaphoretic.  Psychiatric: She has a normal mood  and affect. Her behavior is normal. Judgment and thought content normal.  Nursing note and vitals reviewed.   Assessment/Plan: 1. Hypertension, unspecified type Patient's blood pressure slightly elevated at this visit.  Patient has been rushing around trying to get here from work.  We will continue to monitor in the future, especially at physical that is coming up.  2. Mild intermittent asthma without complication Asthma is well controlled, patient has albuterol rescue inhaler to use as needed.  3. Encounter for completion of form with patient [facility] health questionnaire ablated for patient.  General Counseling: kaleiah kutzer understanding of the findings of todays visit and agrees with plan of treatment. I have discussed any further diagnostic evaluation that may be needed or ordered today. We also reviewed her medications today. she has been encouraged to call the office with any questions or concerns that should arise  related to todays visit.    Orders Placed This Encounter  Procedures  . Ambulatory referral to Gastroenterology    No orders of the defined types were placed in this encounter.   Time spent: 25 Minutes   This patient was seen by Orson Gear AGNP-C in Collaboration with Dr Lavera Guise as a part of collaborative care agreement     Kendell Bane AGNP-C Internal medicine

## 2018-01-27 ENCOUNTER — Ambulatory Visit (INDEPENDENT_AMBULATORY_CARE_PROVIDER_SITE_OTHER): Payer: Managed Care, Other (non HMO) | Admitting: Adult Health

## 2018-01-27 ENCOUNTER — Encounter: Payer: Self-pay | Admitting: *Deleted

## 2018-01-27 ENCOUNTER — Encounter: Payer: Self-pay | Admitting: Adult Health

## 2018-01-27 VITALS — BP 150/88 | HR 82 | Resp 16 | Ht 66.0 in | Wt 293.0 lb

## 2018-01-27 DIAGNOSIS — M1990 Unspecified osteoarthritis, unspecified site: Secondary | ICD-10-CM

## 2018-01-27 DIAGNOSIS — I10 Essential (primary) hypertension: Secondary | ICD-10-CM

## 2018-01-27 DIAGNOSIS — Z0001 Encounter for general adult medical examination with abnormal findings: Secondary | ICD-10-CM

## 2018-01-27 DIAGNOSIS — J4520 Mild intermittent asthma, uncomplicated: Secondary | ICD-10-CM

## 2018-01-27 DIAGNOSIS — F419 Anxiety disorder, unspecified: Secondary | ICD-10-CM

## 2018-01-27 DIAGNOSIS — Z1211 Encounter for screening for malignant neoplasm of colon: Secondary | ICD-10-CM

## 2018-01-27 DIAGNOSIS — R300 Dysuria: Secondary | ICD-10-CM

## 2018-01-27 DIAGNOSIS — Z6841 Body Mass Index (BMI) 40.0 and over, adult: Secondary | ICD-10-CM

## 2018-01-27 MED ORDER — CELECOXIB 50 MG PO CAPS: 50.0000 mg | ORAL_CAPSULE | Freq: Two times a day (BID) | ORAL | 2 refills | Status: DC

## 2018-01-27 NOTE — Patient Instructions (Signed)
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
## 2018-01-28 ENCOUNTER — Other Ambulatory Visit: Payer: Self-pay

## 2018-01-28 DIAGNOSIS — Z1211 Encounter for screening for malignant neoplasm of colon: Secondary | ICD-10-CM

## 2018-01-28 LAB — MICROSCOPIC EXAMINATION
Casts: NONE SEEN /lpf
WBC, UA: NONE SEEN /hpf (ref 0–5)

## 2018-01-28 LAB — UA/M W/RFLX CULTURE, ROUTINE
BILIRUBIN UA: NEGATIVE
GLUCOSE, UA: NEGATIVE
KETONES UA: NEGATIVE
Leukocytes, UA: NEGATIVE
NITRITE UA: NEGATIVE
Protein, UA: NEGATIVE
RBC UA: NEGATIVE
Specific Gravity, UA: >=1.03 — AB (ref 1.005–1.030)
UUROB: 0.2 mg/dL (ref 0.2–1.0)
pH, UA: 5 (ref 5.0–7.5)

## 2018-02-22 ENCOUNTER — Encounter: Admission: RE | Payer: Self-pay | Source: Ambulatory Visit

## 2018-02-22 ENCOUNTER — Telehealth: Payer: Self-pay

## 2018-02-22 ENCOUNTER — Ambulatory Visit: Admission: RE | Admit: 2018-02-22 | Payer: 59 | Source: Ambulatory Visit | Admitting: Gastroenterology

## 2018-02-22 SURGERY — COLONOSCOPY WITH PROPOFOL
Anesthesia: General

## 2018-02-22 NOTE — Telephone Encounter (Signed)
Patient contacted office stated that she did not make her colonoscopy because she had a chest cold.  No Fee will be applied since this she was sick.  She plans to call back with some dates in mind for rescheduling after she discusses with her supervisor a time she can take off.  Thanks [employer]

## 2018-02-23 ENCOUNTER — Encounter: Payer: Self-pay | Admitting: Anesthesiology

## 2018-02-25 ENCOUNTER — Telehealth: Payer: Self-pay

## 2018-02-25 ENCOUNTER — Other Ambulatory Visit: Payer: Self-pay

## 2018-02-25 DIAGNOSIS — Z1211 Encounter for screening for malignant neoplasm of colon: Secondary | ICD-10-CM

## 2018-02-25 NOTE — Telephone Encounter (Signed)
Returned patients call to reschedule her colonoscopy.  LVM to let her know that December 12th is available for colonoscopy, if she would like to have this date.  Waiting for her to call back to confirm.  Thanks [employer]

## 2018-03-10 ENCOUNTER — Ambulatory Visit: Payer: Self-pay | Admitting: Adult Health

## 2018-03-22 ENCOUNTER — Encounter: Payer: Self-pay | Admitting: *Deleted

## 2018-03-24 ENCOUNTER — Ambulatory Visit
Admission: RE | Admit: 2018-03-24 | Payer: Managed Care, Other (non HMO) | Source: Home / Self Care | Admitting: Gastroenterology

## 2018-03-24 ENCOUNTER — Telehealth: Payer: Self-pay

## 2018-03-24 ENCOUNTER — Encounter: Admission: RE | Payer: Self-pay | Source: Home / Self Care

## 2018-03-24 DIAGNOSIS — Z1211 Encounter for screening for malignant neoplasm of colon: Secondary | ICD-10-CM

## 2018-03-24 SURGERY — COLONOSCOPY WITH PROPOFOL
Anesthesia: General

## 2018-03-24 NOTE — Telephone Encounter (Signed)
Patient was contacted by Endo to have her colonoscopy yesterday, then it was moved to tomorrow and is canceled because Dr. Michele Mcalpine schedule is full.  Patient will call me back with some dates for her procedure.  No Cancellation fee will be applied due to mixup on dates and rescheduling.  Thanks [employer]

## 2018-03-25 ENCOUNTER — Ambulatory Visit: Admit: 2018-03-25 | Payer: Managed Care, Other (non HMO) | Admitting: Gastroenterology

## 2018-03-25 SURGERY — COLONOSCOPY WITH PROPOFOL
Anesthesia: General

## 2018-04-15 ENCOUNTER — Ambulatory Visit: Payer: Managed Care, Other (non HMO) | Admitting: Adult Health

## 2018-04-15 ENCOUNTER — Encounter: Payer: Self-pay | Admitting: Adult Health

## 2018-04-15 VITALS — BP 173/87 | HR 85 | Temp 99.0°F | Resp 16 | Ht 65.0 in | Wt 285.0 lb

## 2018-04-15 DIAGNOSIS — I10 Essential (primary) hypertension: Secondary | ICD-10-CM

## 2018-04-15 DIAGNOSIS — J309 Allergic rhinitis, unspecified: Secondary | ICD-10-CM | POA: Diagnosis not present

## 2018-04-15 DIAGNOSIS — H1032 Unspecified acute conjunctivitis, left eye: Secondary | ICD-10-CM | POA: Diagnosis not present

## 2018-04-15 MED ORDER — POLYMYXIN B-TRIMETHOPRIM 10000-0.1 UNIT/ML-% OP SOLN: 1.0000 [drp] | OPHTHALMIC | 0 refills | Status: DC

## 2018-04-15 NOTE — Patient Instructions (Signed)
Allergic Conjunctivitis  A clear membrane (conjunctiva) covers the white part of your eye and the inner surface of your eyelid. Allergic conjunctivitis happens when this membrane has inflammation. This is caused by allergies. Common causes of allergic reactions (allergens)include:  · Outdoor allergens, such as:  ? Pollen.  ? Grass and weeds.  ? Mold spores.  · Indoor allergens, such as:  ? Dust.  ? Smoke.  ? Mold.  ? Pet dander.  ? Animal hair.  This condition can make your eye red or pink. It can also make your eye feel itchy. This condition cannot be spread from one person to another person (is not contagious).  Follow these instructions at home:  · Try not to be around things that you are allergic to.  · Take or apply over-the-counter and prescription medicines only as told by your doctor. These include any eye drops.  · Place a cool, clean washcloth on your eye for 10-20 minutes. Do this 3-4 times a day.  · Do not touch or rub your eyes.  · Do not wear contact lenses until the inflammation is gone. Wear glasses instead.  · Do not wear eye makeup until the inflammation is gone.  · Keep all follow-up visits as told by your doctor. This is important.  Contact a doctor if:  · Your symptoms get worse.  · Your symptoms do not get better with treatment.  · You have mild eye pain.  · You are sensitive to light,  · You have spots or blisters on your eyes.  · You have pus coming from your eye.  · You have a fever.  Get help right away if:  · You have redness, swelling, or other symptoms in only one eye.  · Your vision is blurry.  · You have vision changes.  · You have very bad eye pain.  Summary  · Allergic conjunctivitis is caused by allergies. It can make your eye red or pink, and it can make your eye feel itchy.  · This condition cannot be spread from one person to another person (is not contagious).  · Try not to be around things that you are allergic to.  · Take or apply over-the-counter and prescription medicines  only as told by your doctor. These include any eye drops.  · Contact your doctor if your symptoms get worse or they do not get better with treatment.  This information is not intended to replace advice given to you by your health care provider. Make sure you discuss any questions you have with your health care provider.  Document Released: 08/27/2009 Document Revised: 11/01/2015 Document Reviewed: 11/01/2015  Elsevier Interactive Patient Education © 2019 Elsevier Inc[employer]

## 2018-04-15 NOTE — Progress Notes (Signed)
[facility] [place], [place] [address]  Internal MEDICINE  Office Visit Note  Patient Name: April George  [id number]  [id number]  Date of Service: 04/24/2018  Chief Complaint  Patient presents with  . Eye Problem    pt has pink eye, noticed yesterday left eye, watering yesterdsay at work, granddaughter had pink eye last week. and noticed raspy voice since this morning.      HPI Pt is here for a sick visit. Pt is here reporting that her granddaughter had pink eye last week.  She noticed yesterday that she had a raspy voice, and watery eyes. She denies any fever, or other symptoms.      Current Medication:  Outpatient Encounter Medications as of 04/15/2018  Medication Sig  . albuterol (PROVENTIL HFA;VENTOLIN HFA) 108 (90 BASE) MCG/ACT inhaler Inhale 2 puffs into the lungs every 6 (six) hours as needed for wheezing or shortness of breath.  . celecoxib (CELEBREX) 50 MG capsule Take 1 capsule (50 mg total) by mouth 2 (two) times daily.  Marland Kitchen desloratadine (CLARINEX REDITAB) 5 MG disintegrating tablet Take 1 tablet (5 mg total) by mouth daily as needed.  Marland Kitchen lisinopril (PRINIVIL,ZESTRIL) 20 MG tablet Take 1 tablet (20 mg total) by mouth daily.  Marland Kitchen trimethoprim-polymyxin b (POLYTRIM) ophthalmic solution Place 1 drop into both eyes every 4 (four) hours.   No facility-administered encounter medications on file as of 04/15/2018.       Medical History: Past Medical History:  Diagnosis Date  . Asthma   . Hypertension      Vital Signs: BP (!) 173/87 (BP Location: Right Arm, Patient Position: Sitting, Cuff Size: Large)   Pulse 85   Temp 99 F (37.2 C)   Resp 16   Ht 5\' 5"  (1.651 m)   Wt 285 lb (129.3 kg)   SpO2 97%   BMI 47.43 kg/m    Review of Systems  Constitutional: Negative for chills, fatigue and unexpected weight change.  HENT: Positive for rhinorrhea. Negative for congestion, sneezing and sore throat.   Eyes: Positive for redness and itching.  Negative for photophobia and pain.  Respiratory: Negative for cough, chest tightness and shortness of breath.   Cardiovascular: Negative for chest pain and palpitations.  Gastrointestinal: Negative for abdominal pain, constipation, diarrhea, nausea and vomiting.  Endocrine: Negative.   Genitourinary: Negative for dysuria and frequency.  Musculoskeletal: Negative for arthralgias, back pain, joint swelling and neck pain.  Skin: Negative for rash.  Allergic/Immunologic: Negative.   Neurological: Negative for tremors and numbness.  Hematological: Negative for adenopathy. Does not bruise/bleed easily.  Psychiatric/Behavioral: Negative for behavioral problems and sleep disturbance. The patient is not nervous/anxious.     Physical Exam Vitals signs and nursing note reviewed.  Constitutional:      General: She is not in acute distress.    Appearance: She is well-developed. She is not diaphoretic.  HENT:     Head: Normocephalic and atraumatic.     Mouth/Throat:     Pharynx: No oropharyngeal exudate.  Eyes:     Pupils: Pupils are equal, round, and reactive to light.  Neck:     Musculoskeletal: Normal range of motion and neck supple.     Thyroid: No thyromegaly.     Vascular: No JVD.     Trachea: No tracheal deviation.  Cardiovascular:     Rate and Rhythm: Normal rate and regular rhythm.     Heart sounds: Normal heart sounds. No murmur. No friction rub. No gallop.  Pulmonary:     Effort: Pulmonary effort is normal. No respiratory distress.     Breath sounds: Normal breath sounds. No wheezing or rales.  Chest:     Chest wall: No tenderness.  Abdominal:     Palpations: Abdomen is soft.     Tenderness: There is no abdominal tenderness. There is no guarding.  Musculoskeletal: Normal range of motion.  Lymphadenopathy:     Cervical: No cervical adenopathy.  Skin:    General: Skin is warm and dry.  Neurological:     Mental Status: She is alert and oriented to person, place, and time.      Cranial Nerves: No cranial nerve deficit.  Psychiatric:        Behavior: Behavior normal.        Thought Content: Thought content normal.        Judgment: Judgment normal.    Assessment/Plan: 1. Acute conjunctivitis of left eye, unspecified acute conjunctivitis type Pt given RX for Polytrim.  Instructed patient to use drops in both eyes until symptoms resolve, and then through that day.   - trimethoprim-polymyxin b (POLYTRIM) ophthalmic solution; Place 1 drop into both eyes every 4 (four) hours.  Dispense: 10 mL; Refill: 0  2. Hypertension, unspecified type Slightly elevated, Will continue to follow.  3. Allergic rhinitis, unspecified seasonality, unspecified trigger Encouraged patient to use OTC allergy medications as discussed in visit.   General Counseling: thirza pellicano understanding of the findings of todays visit and agrees with plan of treatment. I have discussed any further diagnostic evaluation that may be needed or ordered today. We also reviewed her medications today. she has been encouraged to call the office with any questions or concerns that should arise related to todays visit.   No orders of the defined types were placed in this encounter.   Meds ordered this encounter  Medications  . trimethoprim-polymyxin b (POLYTRIM) ophthalmic solution    Sig: Place 1 drop into both eyes every 4 (four) hours.    Dispense:  10 mL    Refill:  0    Time spent:25 Minutes  This patient was seen by Orson Gear AGNP-C in Collaboration with Dr Lavera Guise as a part of collaborative care agreement.  Kendell Bane AGNP-C Internal Medicine

## 2018-04-22 ENCOUNTER — Ambulatory Visit: Payer: Self-pay | Admitting: Adult Health

## 2018-06-01 ENCOUNTER — Ambulatory Visit: Payer: 59 | Admitting: Psychiatry

## 2018-06-21 ENCOUNTER — Other Ambulatory Visit: Payer: Self-pay

## 2018-06-21 ENCOUNTER — Encounter (INDEPENDENT_AMBULATORY_CARE_PROVIDER_SITE_OTHER): Payer: 59 | Admitting: Psychiatry

## 2018-06-21 NOTE — Progress Notes (Deleted)
Psychiatric Initial Adult Assessment   Patient Identification: April George MRN:  [id number] Date of Evaluation:  06/21/2018 Referral Source: Orson Gear NP Chief Complaint:   Visit Diagnosis: No diagnosis found.  History of Present Illness:  ***  Associated Signs/Symptoms: Depression Symptoms:  {DEPRESSION SYMPTOMS:20000} (Hypo) Manic Symptoms:  {BHH MANIC SYMPTOMS:22872} Anxiety Symptoms:  {BHH ANXIETY SYMPTOMS:22873} Psychotic Symptoms:  {BHH PSYCHOTIC SYMPTOMS:22874} PTSD Symptoms: {BHH PTSD SYMPTOMS:22875}  Past Psychiatric History: ***  Previous Psychotropic Medications: {YES/NO:21197}  Substance Abuse History in the last 12 months:  {yes no:314532}  Consequences of Substance Abuse: {BHH CONSEQUENCES OF SUBSTANCE ABUSE:22880}  Past Medical History:  Past Medical History:  Diagnosis Date  . Asthma   . Hypertension     Past Surgical History:  Procedure Laterality Date  . ABDOMINAL HYSTERECTOMY      Family Psychiatric History: ***  Family History:  Family History  Problem Relation Age of Onset  . Breast cancer Mother 69  . Breast cancer Cousin     Social History:   Social History   Socioeconomic History  . Marital status: Married    Spouse name: Not on file  . Number of children: Not on file  . Years of education: Not on file  . Highest education level: Not on file  Occupational History  . Not on file  Social Needs  . Financial resource strain: Not on file  . Food insecurity:    Worry: Not on file    Inability: Not on file  . Transportation needs:    Medical: Not on file    Non-medical: Not on file  Tobacco Use  . Smoking status: Never Smoker  . Smokeless tobacco: Never Used  Substance and Sexual Activity  . Alcohol use: No  . Drug use: No  . Sexual activity: Not on file  Lifestyle  . Physical activity:    Days per week: Not on file    Minutes per session: Not on file  . Stress: Not on file  Relationships  . Social connections:   Talks on phone: Not on file    Gets together: Not on file    Attends religious service: Not on file    Active member of club or organization: Not on file    Attends meetings of clubs or organizations: Not on file    Relationship status: Not on file  Other Topics Concern  . Not on file  Social History Narrative  . Not on file    Additional Social History: ***  Allergies:   Allergies  Allergen Reactions  . Codeine Other (See Comments)    Altered mental status     Metabolic Disorder Labs: No results found for: HGBA1C, MPG No results found for: PROLACTIN No results found for: CHOL, TRIG, HDL, CHOLHDL, VLDL, LDLCALC No results found for: TSH  Therapeutic Level Labs: No results found for: LITHIUM No results found for: CBMZ No results found for: VALPROATE  Current Medications: Current Outpatient Medications  Medication Sig Dispense Refill  . albuterol (PROVENTIL HFA;VENTOLIN HFA) 108 (90 BASE) MCG/ACT inhaler Inhale 2 puffs into the lungs every 6 (six) hours as needed for wheezing or shortness of breath.    . celecoxib (CELEBREX) 50 MG capsule Take 1 capsule (50 mg total) by mouth 2 (two) times daily. 60 capsule 2  . desloratadine (CLARINEX REDITAB) 5 MG disintegrating tablet Take 1 tablet (5 mg total) by mouth daily as needed. 30 tablet 5  . lisinopril (PRINIVIL,ZESTRIL) 20 MG tablet Take 1 tablet (20  mg total) by mouth daily. 30 tablet 5  . trimethoprim-polymyxin b (POLYTRIM) ophthalmic solution Place 1 drop into both eyes every 4 (four) hours. 10 mL 0   No current facility-administered medications for this visit.     Musculoskeletal: Strength & Muscle Tone: {desc; muscle tone:32375} Gait & Station: {PE GAIT ED QJFH:54562} Patient leans: {Patient Leans:21022755}  Psychiatric Specialty Exam: ROS  There were no vitals taken for this visit.There is no height or weight on file to calculate BMI.  General Appearance: {Appearance:22683}  Eye Contact:  {BHH EYE CONTACT:22684}   Speech:  {Speech:22685}  Volume:  {Volume (PAA):22686}  Mood:  {BHH MOOD:22306}  Affect:  {Affect (PAA):22687}  Thought Process:  {Thought Process (PAA):22688}  Orientation:  {BHH ORIENTATION (PAA):22689}  Thought Content:  {Thought Content:22690}  Suicidal Thoughts:  {ST/HT (PAA):22692}  Homicidal Thoughts:  {ST/HT (PAA):22692}  Memory:  {BHH MEMORY:22881}  Judgement:  {Judgement (PAA):22694}  Insight:  {Insight (PAA):22695}  Psychomotor Activity:  {Psychomotor (PAA):22696}  Concentration:  {Concentration:21399}  Recall:  {BHH GOOD/FAIR/POOR:22877}  Fund of Knowledge:{BHH GOOD/FAIR/POOR:22877}  Language: {BHH GOOD/FAIR/POOR:22877}  Akathisia:  {BHH YES OR NO:22294}  Handed:  {Handed:22697}  AIMS (if indicated):  {Desc; done/not:10129}  Assets:  {Assets (PAA):22698}  ADL's:  {BHH BWL'S:93734}  Cognition: {chl bhh cognition:304700322}  Sleep:  {BHH GOOD/FAIR/POOR:22877}   Screenings: PHQ2-9     Office Visit from 04/15/2018 in [facility], [facility] Office Visit from 01/27/2018 in [facility] Office Visit from 11/19/2017 in [facility], [facility]  PHQ-2 Total Score  0  0  0      Assessment and Plan: ***   Ursula Alert, MD 3/31/20202:01 PM

## 2018-07-12 ENCOUNTER — Other Ambulatory Visit: Payer: Self-pay

## 2018-07-12 ENCOUNTER — Ambulatory Visit (INDEPENDENT_AMBULATORY_CARE_PROVIDER_SITE_OTHER): Payer: 59 | Admitting: Psychiatry

## 2018-07-12 ENCOUNTER — Encounter: Payer: Self-pay | Admitting: Psychiatry

## 2018-07-12 DIAGNOSIS — F329 Major depressive disorder, single episode, unspecified: Secondary | ICD-10-CM | POA: Diagnosis not present

## 2018-07-12 DIAGNOSIS — F32A Depression, unspecified: Secondary | ICD-10-CM

## 2018-07-12 MED ORDER — HYDROXYZINE HCL 10 MG PO TABS: 10.0000 mg | ORAL_TABLET | Freq: Two times a day (BID) | ORAL | 0 refills | Status: DC | PRN

## 2018-07-12 NOTE — Progress Notes (Signed)
TC on  07-12-18 @ 2:40 spoke with patient reviewed patient allergies with no changes. Reviewed the medical and surgical hx with updated changes.  Pt medications and pharmacy were reviewed with updated changes. No vitals taken because this is a phone consult.
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
## 2018-08-17 ENCOUNTER — Other Ambulatory Visit: Payer: Self-pay

## 2018-08-17 ENCOUNTER — Encounter: Payer: Self-pay | Admitting: Psychiatry

## 2018-08-17 ENCOUNTER — Ambulatory Visit (INDEPENDENT_AMBULATORY_CARE_PROVIDER_SITE_OTHER): Payer: 59 | Admitting: Psychiatry

## 2018-08-17 ENCOUNTER — Encounter: Payer: Self-pay | Admitting: Licensed Clinical Social Worker

## 2018-08-17 ENCOUNTER — Ambulatory Visit (INDEPENDENT_AMBULATORY_CARE_PROVIDER_SITE_OTHER): Payer: 59 | Admitting: Licensed Clinical Social Worker

## 2018-08-17 DIAGNOSIS — F329 Major depressive disorder, single episode, unspecified: Secondary | ICD-10-CM

## 2018-08-17 DIAGNOSIS — F32A Depression, unspecified: Secondary | ICD-10-CM
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
## 2018-09-06 ENCOUNTER — Encounter: Payer: Self-pay | Admitting: Licensed Clinical Social Worker

## 2018-09-06 ENCOUNTER — Other Ambulatory Visit: Payer: Self-pay

## 2018-09-06 ENCOUNTER — Ambulatory Visit (INDEPENDENT_AMBULATORY_CARE_PROVIDER_SITE_OTHER): Payer: 59 | Admitting: Licensed Clinical Social Worker

## 2018-09-06 DIAGNOSIS — F329 Major depressive disorder, single episode, unspecified: Secondary | ICD-10-CM

## 2018-09-06 DIAGNOSIS — F32A Depression, unspecified: Secondary | ICD-10-CM
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
## 2018-09-26 ENCOUNTER — Ambulatory Visit: Payer: Managed Care, Other (non HMO) | Admitting: Adult Health

## 2018-09-26 ENCOUNTER — Other Ambulatory Visit: Payer: Self-pay

## 2018-09-26 ENCOUNTER — Encounter: Payer: Self-pay | Admitting: Adult Health

## 2018-09-26 ENCOUNTER — Other Ambulatory Visit: Payer: Self-pay | Admitting: Adult Health

## 2018-09-26 VITALS — BP 180/90 | HR 90 | Resp 16 | Ht 65.0 in | Wt 299.0 lb

## 2018-09-26 DIAGNOSIS — I10 Essential (primary) hypertension: Secondary | ICD-10-CM

## 2018-09-26 DIAGNOSIS — R600 Localized edema: Secondary | ICD-10-CM | POA: Diagnosis not present

## 2018-09-26 DIAGNOSIS — R0683 Snoring: Secondary | ICD-10-CM | POA: Diagnosis not present

## 2018-09-26 DIAGNOSIS — R0609 Other forms of dyspnea: Secondary | ICD-10-CM | POA: Diagnosis not present

## 2018-09-26 MED ORDER — FUROSEMIDE 20 MG PO TABS: 20.0000 mg | ORAL_TABLET | Freq: Every day | ORAL | 0 refills | Status: DC

## 2018-09-26 MED ORDER — LISINOPRIL 20 MG PO TABS: 30.0000 mg | ORAL_TABLET | Freq: Every day | ORAL | 0 refills | Status: DC

## 2018-09-26 NOTE — Progress Notes (Signed)
[facility] [place], [place] [address]  Internal MEDICINE  Office Visit Note  Patient Name: April George  [id number]  [id number]  Date of Service: 09/26/2018  Chief Complaint  Patient presents with  . Foot Swelling    feels tight on both feet. hurts when walking. fluid build up  . Hypertension     HPI Pt is here for a sick visit. Pt reports one week of bilateral feet swelling and fluid build up.  She reports she has had DOE.  She is also having hypertension dispite medication.  She is also having blurry vision at times, and tightness. She denies chest pain, but does feel chest tightness and sob at times.      Current Medication:  Outpatient Encounter Medications as of 09/26/2018  Medication Sig  . albuterol (PROVENTIL HFA;VENTOLIN HFA) 108 (90 BASE) MCG/ACT inhaler Inhale 2 puffs into the lungs every 6 (six) hours as needed for wheezing or shortness of breath.  . celecoxib (CELEBREX) 50 MG capsule Take 1 capsule (50 mg total) by mouth 2 (two) times daily.  Marland Kitchen desloratadine (CLARINEX REDITAB) 5 MG disintegrating tablet Take 1 tablet (5 mg total) by mouth daily as needed.  . hydrOXYzine (ATARAX/VISTARIL) 10 MG tablet Take 1-2 tablets (10-20 mg total) by mouth 2 (two) times daily as needed. For severe anxiety  . lisinopril (ZESTRIL) 20 MG tablet Take 1.5 tablets (30 mg total) by mouth daily.  . [DISCONTINUED] lisinopril (PRINIVIL,ZESTRIL) 20 MG tablet Take 1 tablet (20 mg total) by mouth daily.  . furosemide (LASIX) 20 MG tablet Take 1 tablet (20 mg total) by mouth daily.   No facility-administered encounter medications on file as of 09/26/2018.       Medical History: Past Medical History:  Diagnosis Date  . Asthma   . Hypertension      Vital Signs: BP (!) 180/90   Pulse 90   Resp 16   Ht '5\' 5"'$  (1.651 m)   Wt 299 lb (135.6 kg)   SpO2 97%   BMI 49.76 kg/m    Review of Systems  Constitutional: Negative for chills, fatigue and unexpected  weight change.  HENT: Negative for congestion, rhinorrhea, sneezing and sore throat.   Eyes: Negative for photophobia, pain and redness.  Respiratory: Positive for chest tightness. Negative for cough and shortness of breath.   Cardiovascular: Positive for leg swelling. Negative for chest pain and palpitations.  Gastrointestinal: Negative for abdominal pain, constipation, diarrhea, nausea and vomiting.  Endocrine: Negative.   Genitourinary: Negative for dysuria and frequency.  Musculoskeletal: Negative for arthralgias, back pain, joint swelling and neck pain.  Skin: Negative for rash.  Allergic/Immunologic: Negative.   Neurological: Negative for tremors and numbness.  Hematological: Negative for adenopathy. Does not bruise/bleed easily.  Psychiatric/Behavioral: Negative for behavioral problems and sleep disturbance. The patient is not nervous/anxious.     Physical Exam Vitals signs and nursing note reviewed.  Constitutional:      General: She is not in acute distress.    Appearance: She is well-developed. She is not diaphoretic.  HENT:     Head: Normocephalic and atraumatic.     Mouth/Throat:     Pharynx: No oropharyngeal exudate.  Eyes:     Pupils: Pupils are equal, round, and reactive to light.  Neck:     Musculoskeletal: Normal range of motion and neck supple.     Thyroid: No thyromegaly.     Vascular: No JVD.     Trachea: No tracheal deviation.  Cardiovascular:     Rate and Rhythm: Normal rate and regular rhythm.     Heart sounds: Normal heart sounds. No murmur. No friction rub. No gallop.   Pulmonary:     Effort: Pulmonary effort is normal. No respiratory distress.     Breath sounds: Normal breath sounds. No wheezing or rales.  Chest:     Chest wall: No tenderness.  Abdominal:     Palpations: Abdomen is soft.     Tenderness: There is no abdominal tenderness. There is no guarding.  Musculoskeletal: Normal range of motion.     Right lower leg: Edema present.     Left  lower leg: Edema present.  Lymphadenopathy:     Cervical: No cervical adenopathy.  Skin:    General: Skin is warm and dry.  Neurological:     Mental Status: She is alert and oriented to person, place, and time.     Cranial Nerves: No cranial nerve deficit.  Psychiatric:        Behavior: Behavior normal.        Thought Content: Thought content normal.        Judgment: Judgment normal.    Assessment/Plan: 1. Lower extremity edema Pt will have baseline labs drawn today/tomorrow.  And will start lasix.  Will revaluate met B in 4 weeks.  - furosemide (LASIX) 20 MG tablet; Take 1 tablet (20 mg total) by mouth daily.  Dispense: 30 tablet; Refill: 0  2. DOE (dyspnea on exertion) EKG shows some atrial enlargement could be due to undiagnosed sleep apnea. Will get echo for evaluation of cardiac function. - ECHOCARDIOGRAM COMPLETE; Future - EKG 12-Lead  3. Hypertension, unspecified type Increase to 57m of lisinopril daily, follow up in 4 weeks.  Get labs drawn today.  - lisinopril (ZESTRIL) 20 MG tablet; Take 1.5 tablets (30 mg total) by mouth daily.  Dispense: 45 tablet; Refill: 0  4. Loud snoring Evaluate patient for osa.  - Home sleep test  General Counseling: Lmargree gimbelunderstanding of the findings of todays visit and agrees with plan of treatment. I have discussed any further diagnostic evaluation that may be needed or ordered today. We also reviewed her medications today. she has been encouraged to call the office with any questions or concerns that should arise related to todays visit.   Orders Placed This Encounter  Procedures  . EKG 12-Lead  . ECHOCARDIOGRAM COMPLETE  . Home sleep test    Meds ordered this encounter  Medications  . furosemide (LASIX) 20 MG tablet    Sig: Take 1 tablet (20 mg total) by mouth daily.    Dispense:  30 tablet    Refill:  0  . lisinopril (ZESTRIL) 20 MG tablet    Sig: Take 1.5 tablets (30 mg total) by mouth daily.    Dispense:  45  tablet    Refill:  0    Time spent: 25 Minutes  This patient was seen by AOrson GearAGNP-C in Collaboration with Dr FLavera Guiseas a part of collaborative care agreement.  AKendell BaneAGNP-C Internal Medicine

## 2018-09-27 LAB — CBC WITH DIFFERENTIAL/PLATELET
Basophils Absolute: 0 10*3/uL (ref 0.0–0.2)
Basos: 1 %
EOS (ABSOLUTE): 0.2 10*3/uL (ref 0.0–0.4)
Eos: 4 %
Hematocrit: 39.1 % (ref 34.0–46.6)
Hemoglobin: 12.7 g/dL (ref 11.1–15.9)
Immature Grans (Abs): 0 10*3/uL (ref 0.0–0.1)
Immature Granulocytes: 1 %
Lymphocytes Absolute: 1.1 10*3/uL (ref 0.7–3.1)
Lymphs: 22 %
MCH: 27.3 pg (ref 26.6–33.0)
MCHC: 32.5 g/dL (ref 31.5–35.7)
MCV: 84 fL (ref 79–97)
Monocytes Absolute: 0.5 10*3/uL (ref 0.1–0.9)
Monocytes: 10 %
Neutrophils Absolute: 3.1 10*3/uL (ref 1.4–7.0)
Neutrophils: 62 %
Platelets: 240 10*3/uL (ref 150–450)
RBC: 4.66 x10E6/uL (ref 3.77–5.28)
RDW: 12.7 % (ref 11.7–15.4)
WBC: 5 10*3/uL (ref 3.4–10.8)

## 2018-09-27 LAB — COMPREHENSIVE METABOLIC PANEL
ALT: 14 IU/L (ref 0–32)
AST: 14 IU/L (ref 0–40)
Albumin/Globulin Ratio: 1.4 (ref 1.2–2.2)
Albumin: 4 g/dL (ref 3.8–4.9)
Alkaline Phosphatase: 71 IU/L (ref 39–117)
BUN/Creatinine Ratio: 14 (ref 9–23)
BUN: 12 mg/dL (ref 6–24)
Bilirubin Total: 0.3 mg/dL (ref 0.0–1.2)
CO2: 23 mmol/L (ref 20–29)
Calcium: 9.1 mg/dL (ref 8.7–10.2)
Chloride: 104 mmol/L (ref 96–106)
Creatinine, Ser: 0.85 mg/dL (ref 0.57–1.00)
GFR calc Af Amer: 89 mL/min/{1.73_m2} (ref >59)
GFR calc non Af Amer: 77 mL/min/{1.73_m2} (ref >59)
Globulin, Total: 2.8 g/dL (ref 1.5–4.5)
Glucose: 93 mg/dL (ref 65–99)
Potassium: 4.6 mmol/L (ref 3.5–5.2)
Sodium: 140 mmol/L (ref 134–144)
Total Protein: 6.8 g/dL (ref 6.0–8.5)

## 2018-09-27 LAB — LIPID PANEL WITH LDL/HDL RATIO
Cholesterol, Total: 130 mg/dL (ref 100–199)
HDL: 69 mg/dL (ref >39)
LDL Calculated: 37 mg/dL (ref 0–99)
LDl/HDL Ratio: 0.5 ratio (ref 0.0–3.2)
Triglycerides: 122 mg/dL (ref 0–149)
VLDL Cholesterol Cal: 24 mg/dL (ref 5–40)

## 2018-09-27 LAB — T4, FREE: Free T4: 0.98 ng/dL (ref 0.82–1.77)

## 2018-09-27 LAB — TSH: TSH: 1.5 u[IU]/mL (ref 0.450–4.500)

## 2018-09-30 ENCOUNTER — Other Ambulatory Visit: Payer: Self-pay

## 2018-09-30 ENCOUNTER — Other Ambulatory Visit: Payer: Self-pay | Admitting: Adult Health

## 2018-09-30 ENCOUNTER — Ambulatory Visit: Payer: Managed Care, Other (non HMO)

## 2018-09-30 ENCOUNTER — Ambulatory Visit (INDEPENDENT_AMBULATORY_CARE_PROVIDER_SITE_OTHER): Payer: Managed Care, Other (non HMO)

## 2018-09-30 VITALS — BP 133/75 | HR 92 | Resp 16 | Ht 65.0 in | Wt 292.0 lb

## 2018-09-30 DIAGNOSIS — R0602 Shortness of breath: Secondary | ICD-10-CM | POA: Diagnosis not present

## 2018-09-30 DIAGNOSIS — R0609 Other forms of dyspnea: Secondary | ICD-10-CM

## 2018-09-30 DIAGNOSIS — I10 Essential (primary) hypertension: Secondary | ICD-10-CM | POA: Diagnosis not present

## 2018-09-30 NOTE — Progress Notes (Signed)
Pt came to check her BP was 133/75 is good as per heather is ok follow up next week

## 2018-10-03 ENCOUNTER — Telehealth: Payer: Self-pay

## 2018-10-03 NOTE — Telephone Encounter (Signed)
Pt advised we put labs order in epic repeat labs few days before your next appt

## 2018-10-04 ENCOUNTER — Ambulatory Visit (INDEPENDENT_AMBULATORY_CARE_PROVIDER_SITE_OTHER): Payer: 59 | Admitting: Licensed Clinical Social Worker

## 2018-10-04 ENCOUNTER — Telehealth: Payer: Self-pay

## 2018-10-04 ENCOUNTER — Other Ambulatory Visit: Payer: Self-pay

## 2018-10-04 ENCOUNTER — Encounter: Payer: Self-pay | Admitting: Licensed Clinical Social Worker

## 2018-10-04 DIAGNOSIS — F329 Major depressive disorder, single episode, unspecified: Secondary | ICD-10-CM

## 2018-10-04 DIAGNOSIS — F32A Depression, unspecified: Secondary | ICD-10-CM

## 2018-10-04 NOTE — Telephone Encounter (Signed)
Unable to leave message for patient, advising her paperwork ready up front and put copy in scan. Beth

## 2018-10-04 NOTE — Progress Notes (Signed)
Virtual Visit via Video Note  I connected with April George on 10/04/18 at  2:30 PM EDT by a video enabled telemedicine application and verified that I am speaking with the correct person using two identifiers.   I discussed the limitations of evaluation and management by telemedicine and the availability of in person appointments. The patient expressed understanding and agreed to proceed.   I discussed the assessment and treatment plan with the patient. The patient was provided an opportunity to ask questions and all were answered. The patient agreed with the plan and demonstrated an understanding of the instructions.   The patient was advised to call back or seek an in-person evaluation if the symptoms worsen or if the condition fails to improve as anticipated.  I provided 30 minutes of non-face-to-face time during this encounter.   Alden Hipp, LCSW    THERAPIST PROGRESS NOTE  Session Time: 1430  Participation Level: Active  Behavioral Response: CasualAlerthopeful  Type of Therapy: Individual Therapy  Treatment Goals addressed: Anxiety  Interventions: CBT  Summary: KIELEY AKTER is a 58 y.o. female who presents with continued symptoms of her diagnosis. Krina reports continuing to do well since our last session. Alasia reports continuing to talk herself through stressful situations and examining why certain feelings are coming up. She reports recognizing she cannot take on everything someone asks her to, and has begun setting boundaries to limit what she expects of herself throughout the day. LCSW validated that practice, and held space to discuss the importance of boundaries and feelings associated with setting them. We discussed how setting boundaries can elicit guilt, but facts aren't feelings, and it's important to remind yourself of that. Kaydra expressed understanding and agreement. 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 She reported feeling overwhelmed by that task but added, "so I called in the troops. My sister and her daughters are going to come over and help me with something that alone would take a week but together will take a day." LCSW highlighted how significant that progress is, and how much self examination it takes to recognize when to ask for help. Viviane also noted she has started talking to others about going to therapy to attempt to deplete stigma around therapy. LCSW validated that idea as well.   Suicidal/Homicidal: No  Therapist Response: Leveda continues to work towards her tx goals but has not yet reached them. We will continue to work on emotional regulation skills moving forward by utilizing CBT skills.   Plan: Return again in 2 weeks.  Diagnosis: Axis I: Depressive Disorder NOS    Axis II: No diagnosis    Alden Hipp, LCSW 10/04/2018

## 2018-10-06 ENCOUNTER — Other Ambulatory Visit: Payer: Self-pay

## 2018-10-06 ENCOUNTER — Encounter: Payer: Self-pay | Admitting: Adult Health

## 2018-10-06 ENCOUNTER — Ambulatory Visit: Payer: Managed Care, Other (non HMO) | Admitting: Adult Health

## 2018-10-06 VITALS — BP 150/87 | HR 77 | Resp 16 | Ht 65.0 in | Wt 292.0 lb

## 2018-10-06 DIAGNOSIS — Z1211 Encounter for screening for malignant neoplasm of colon: Secondary | ICD-10-CM

## 2018-10-06 DIAGNOSIS — Z1239 Encounter for other screening for malignant neoplasm of breast: Secondary | ICD-10-CM

## 2018-10-06 DIAGNOSIS — I10 Essential (primary) hypertension: Secondary | ICD-10-CM | POA: Diagnosis not present

## 2018-10-06 NOTE — Progress Notes (Signed)
[facility] [place], [place] [address]  Internal MEDICINE  Office Visit Note  Patient Name: April George  [id number]  [id number]  Date of Service: 10/06/2018  Chief Complaint  Patient presents with  . Hypertension  . Asthma  . Follow-up    labs and BP   . Quality Metric Gaps    colonoscopy and mammogram    HPI Pt is here for follow up on BP and echo results.  BP remains slightly elevated 150/87, however is much improved. Her echo shows grade II diastolic dysfunction.  Her lab results are complete normal and were gone over with patient.  She is in need of colonoscopy and mammogram to close quality metric gaps.       Current Medication: Outpatient Encounter Medications as of 10/06/2018  Medication Sig  . albuterol (PROVENTIL HFA;VENTOLIN HFA) 108 (90 BASE) MCG/ACT inhaler Inhale 2 puffs into the lungs every 6 (six) hours as needed for wheezing or shortness of breath.  . celecoxib (CELEBREX) 50 MG capsule Take 1 capsule (50 mg total) by mouth 2 (two) times daily.  Marland Kitchen desloratadine (CLARINEX REDITAB) 5 MG disintegrating tablet Take 1 tablet (5 mg total) by mouth daily as needed.  . furosemide (LASIX) 20 MG tablet Take 1 tablet (20 mg total) by mouth daily.  . hydrOXYzine (ATARAX/VISTARIL) 10 MG tablet Take 1-2 tablets (10-20 mg total) by mouth 2 (two) times daily as needed. For severe anxiety  . lisinopril (ZESTRIL) 20 MG tablet Take 1.5 tablets (30 mg total) by mouth daily.   No facility-administered encounter medications on file as of 10/06/2018.     Surgical History: Past Surgical History:  Procedure Laterality Date  . ABDOMINAL HYSTERECTOMY      Medical History: Past Medical History:  Diagnosis Date  . Asthma   . Hypertension     Family History: Family History  Problem Relation Age of Onset  . Breast cancer Mother 74  . Breast cancer Cousin   . Depression Sister   . Anxiety disorder Sister     Social History   Socioeconomic History   . Marital status: Married    Spouse name: Lanny Hurst  . Number of children: 2  . Years of education: Not on file  . Highest education level: Associate degree: occupational, Hotel manager, or vocational program  Occupational History  . Not on file  Social Needs  . Financial resource strain: Not hard at all  . Food insecurity    Worry: Never true    Inability: Never true  . Transportation needs    Medical: No    Non-medical: No  Tobacco Use  . Smoking status: Never Smoker  . Smokeless tobacco: Never Used  Substance and Sexual Activity  . Alcohol use: No  . Drug use: No  . Sexual activity: Yes  Lifestyle  . Physical activity    Days per week: 0 days    Minutes per session: 0 min  . Stress: Not at all  Relationships  . Social Herbalist on phone: Not on file    Gets together: Not on file    Attends religious service: More than 4 times per year    Active member of club or organization: No    Attends meetings of clubs or organizations: Never    Relationship status: Married  . Intimate partner violence    Fear of current or ex partner: No    Emotionally abused: No    Physically abused: No  Forced sexual activity: No  Other Topics Concern  . Not on file  Social History Narrative  . Not on file      Review of Systems  Constitutional: Negative for chills, fatigue and unexpected weight change.  HENT: Negative for congestion, rhinorrhea, sneezing and sore throat.   Eyes: Negative for photophobia, pain and redness.  Respiratory: Negative for cough, chest tightness and shortness of breath.   Cardiovascular: Negative for chest pain and palpitations.  Gastrointestinal: Negative for abdominal pain, constipation, diarrhea, nausea and vomiting.  Endocrine: Negative.   Genitourinary: Negative for dysuria and frequency.  Musculoskeletal: Negative for arthralgias, back pain, joint swelling and neck pain.  Skin: Negative for rash.  Allergic/Immunologic: Negative.    Neurological: Negative for tremors and numbness.  Hematological: Negative for adenopathy. Does not bruise/bleed easily.  Psychiatric/Behavioral: Negative for behavioral problems and sleep disturbance. The patient is not nervous/anxious.     Vital Signs: BP (!) 150/87   Pulse 77   Resp 16   Ht 5\' 5"  (1.651 m)   Wt 292 lb (132.5 kg)   SpO2 98%   BMI 48.59 kg/m    Physical Exam Vitals signs and nursing note reviewed.  Constitutional:      General: She is not in acute distress.    Appearance: She is well-developed. She is not diaphoretic.  HENT:     Head: Normocephalic and atraumatic.     Mouth/Throat:     Pharynx: No oropharyngeal exudate.  Eyes:     Pupils: Pupils are equal, round, and reactive to light.  Neck:     Musculoskeletal: Normal range of motion and neck supple.     Thyroid: No thyromegaly.     Vascular: No JVD.     Trachea: No tracheal deviation.  Cardiovascular:     Rate and Rhythm: Normal rate and regular rhythm.     Heart sounds: Normal heart sounds. No murmur. No friction rub. No gallop.   Pulmonary:     Effort: Pulmonary effort is normal. No respiratory distress.     Breath sounds: Normal breath sounds. No wheezing or rales.  Chest:     Chest wall: No tenderness.  Abdominal:     Palpations: Abdomen is soft.     Tenderness: There is no abdominal tenderness. There is no guarding.  Musculoskeletal: Normal range of motion.  Lymphadenopathy:     Cervical: No cervical adenopathy.  Skin:    General: Skin is warm and dry.  Neurological:     Mental Status: She is alert and oriented to person, place, and time.     Cranial Nerves: No cranial nerve deficit.  Psychiatric:        Behavior: Behavior normal.        Thought Content: Thought content normal.        Judgment: Judgment normal.    Assessment/Plan: 1. Hypertension, unspecified type Continue medications as prescribed.  Follow up as scheduled  2. Screening for colon cancer - Ambulatory referral to  Gastroenterology  3. Screening for breast cancer - MM DIGITAL SCREENING BILATERAL; Future  General Counseling: elky funches understanding of the findings of todays visit and agrees with plan of treatment. I have discussed any further diagnostic evaluation that may be needed or ordered today. We also reviewed her medications today. she has been encouraged to call the office with any questions or concerns that should arise related to todays visit.    No orders of the defined types were placed in this encounter.   No  orders of the defined types were placed in this encounter.   Time spent: 20 Minutes   This patient was seen by Orson Gear AGNP-C in Collaboration with Dr Lavera Guise as a part of collaborative care agreement     Kendell Bane AGNP-C Internal medicine

## 2018-10-10 ENCOUNTER — Other Ambulatory Visit: Payer: Managed Care, Other (non HMO) | Admitting: Internal Medicine

## 2018-10-10 ENCOUNTER — Other Ambulatory Visit: Payer: Self-pay

## 2018-10-10 DIAGNOSIS — G4710 Hypersomnia, unspecified: Secondary | ICD-10-CM

## 2018-10-17 ENCOUNTER — Encounter: Payer: Self-pay | Admitting: *Deleted

## 2018-10-18 ENCOUNTER — Ambulatory Visit: Payer: 59 | Admitting: Psychiatry

## 2018-10-23 NOTE — Procedures (Signed)
[facility] [place], [place] [address]  Sleep Specialist: [name], MD [facility] Sleep Study Interpretation  Patient Name: April George Patient MR IFOYDX:[id number] DOB:12-Aug-1960  Date of Study: 10/10/2018  Indications for study: Hypersomnia snoring  BMI: 48.5 kg/m       Respiratory Data:  Total AHI: 1.1/h  Total Obstructive Apneas: 0  Total Central Apneas: 0  Total Mixed Apneas: 0  Total Hypopneas: 4  If the AHI is greater than 5 per hour patient qualifies for PAP evaluation  Oximetry Data:  Oxygen Desaturation Index: 5.3/h  Lowest Desaturation: 50%  Cardiac Data:  Minimum Heart Rate: 40  Maximum Heart Rate: 157   Impression / Diagnosis:  This apnea study does not show significant obstructive sleep apnea.  There is however significant oxygen desaturation with the lowest saturation of 50%.  Consider an overnight oximetry to further assess.  In addition there is some tachycardia bradycardia noted.  Consider further cardiac work-up if clinically warranted.  Clinical correlation is recommended.  GENERAL Recommendations:  1.  Consider Auto PAP with pressure ranges 5-20 cmH20 with download, or facility based PAP Titration Study  2.  Consider PAP interface mask fitted for patient comfort, Heated Humidification & PAP compliance monitoring (1 month, 3 months & 12 months after PAP initiation)  3. Consider treatment with mandibular advancement splint (MAS) or referral to an ENT surgeon for modification to the upper airway if the patient prefers an alternate therapy or the PAP trial is unsuccessful  4. Sleep hygiene measures should be discussed with the patient  5. Behavioral therapy such as weight reduction or smoking cessation as appropriate for the patient  6. Advise patient against the use of alcohol or sedatives in so much as these substances can worsen excessive daytime sleepiness and respiratory disturbances of sleep  7.  Advise patient against participating in potentially dangerous activities while drowsy such as operating a motor vehicle, heavy equipment or power tools as it can put them and others in danger  8. Advise patient of the long term consequences of OSA if left untreated, need for treatment and close follow up  9. Clinical follow up as deemed necessary     This Level III home sleep study was performed using the [employer], a 4 channel screening device subject to limitations. Depending on actual total sleep time, not measured in this study, the AHI (sum of apneas and hypopneas/hr of sleep) and therefore the severity of sleep apnea may be underestimated. As with any single night study, including Level 1 attended PSG, severity of sleep apnea may also be underestimated due to the lack of supine and/or REM sleep.  The interpretation associated with this report is based on normal values and degrees of severity in accordance with AASM parameters and/or estimated from multiple sources in the literature for adults ages 6-80+. These may not agree with the displayed values. The patient's treating physician should use the interpretation and recommendations in conjunction with the overall clinical evaluation and treatment of the patient.  Some of the terminology used in this scored [facility]nea[facility] report was developed several years ago and may not always be in accordance with current nomenclature. This in no way affects the accuracy of the data or the reliability of the interpretation and recommendations.

## 2018-10-24 ENCOUNTER — Encounter: Payer: Self-pay | Admitting: Internal Medicine

## 2018-10-24 ENCOUNTER — Other Ambulatory Visit: Payer: Self-pay

## 2018-10-24 ENCOUNTER — Ambulatory Visit: Payer: Managed Care, Other (non HMO) | Admitting: Internal Medicine

## 2018-10-24 VITALS — BP 168/90 | HR 79 | Resp 16 | Ht 65.0 in | Wt 298.6 lb

## 2018-10-24 DIAGNOSIS — G4734 Idiopathic sleep related nonobstructive alveolar hypoventilation: Secondary | ICD-10-CM | POA: Diagnosis not present

## 2018-10-24 DIAGNOSIS — G4710 Hypersomnia, unspecified: Secondary | ICD-10-CM | POA: Diagnosis not present

## 2018-10-24 DIAGNOSIS — I10 Essential (primary) hypertension: Secondary | ICD-10-CM

## 2018-10-24 NOTE — Progress Notes (Signed)
[facility] [place], [place] [address]  Pulmonary Sleep Medicine   Office Visit Note  Patient Name: April George DOB: 04-Apr-1960 MRN [id number]  Date of Service: 10/24/2018  Complaints/HPI: Pt is here for follow up on home sleep study. Her sleep study showed an overall AHI of 1.1.  However, she did have a lowest desaturation of 50%.  She will need an overnight oximetry.       ROS  General: (-) fever, (-) chills, (-) night sweats, (-) weakness Skin: (-) rashes, (-) itching,. Eyes: (-) visual changes, (-) redness, (-) itching. Nose and Sinuses: (-) nasal stuffiness or itchiness, (-) postnasal drip, (-) nosebleeds, (-) sinus trouble. Mouth and Throat: (-) sore throat, (-) hoarseness. Neck: (-) swollen glands, (-) enlarged thyroid, (-) neck pain. Respiratory: - cough, (-) bloody sputum, - shortness of breath, - wheezing. Cardiovascular: - ankle swelling, (-) chest pain. Lymphatic: (-) lymph node enlargement. Neurologic: (-) numbness, (-) tingling. Psychiatric: (-) anxiety, (-) depression   Current Medication: Outpatient Encounter Medications as of 10/24/2018  Medication Sig  . albuterol (PROVENTIL HFA;VENTOLIN HFA) 108 (90 BASE) MCG/ACT inhaler Inhale 2 puffs into the lungs every 6 (six) hours as needed for wheezing or shortness of breath.  . celecoxib (CELEBREX) 50 MG capsule Take 1 capsule (50 mg total) by mouth 2 (two) times daily.  Marland Kitchen desloratadine (CLARINEX REDITAB) 5 MG disintegrating tablet Take 1 tablet (5 mg total) by mouth daily as needed.  . furosemide (LASIX) 20 MG tablet Take 1 tablet (20 mg total) by mouth daily.  . hydrOXYzine (ATARAX/VISTARIL) 10 MG tablet Take 1-2 tablets (10-20 mg total) by mouth 2 (two) times daily as needed. For severe anxiety  . lisinopril (ZESTRIL) 20 MG tablet Take 1.5 tablets (30 mg total) by mouth daily.   No facility-administered encounter medications on file as of 10/24/2018.     Surgical History: Past Surgical  History:  Procedure Laterality Date  . ABDOMINAL HYSTERECTOMY      Medical History: Past Medical History:  Diagnosis Date  . Asthma   . Hypertension     Family History: Family History  Problem Relation Age of Onset  . Breast cancer Mother 64  . Breast cancer Cousin   . Depression Sister   . Anxiety disorder Sister     Social History: Social History   Socioeconomic History  . Marital status: Married    Spouse name: Lanny Hurst  . Number of children: 2  . Years of education: Not on file  . Highest education level: Associate degree: occupational, Hotel manager, or vocational program  Occupational History  . Not on file  Social Needs  . Financial resource strain: Not hard at all  . Food insecurity    Worry: Never true    Inability: Never true  . Transportation needs    Medical: No    Non-medical: No  Tobacco Use  . Smoking status: Never Smoker  . Smokeless tobacco: Never Used  Substance and Sexual Activity  . Alcohol use: No  . Drug use: No  . Sexual activity: Yes  Lifestyle  . Physical activity    Days per week: 0 days    Minutes per session: 0 min  . Stress: Not at all  Relationships  . Social Herbalist on phone: Not on file    Gets together: Not on file    Attends religious service: More than 4 times per year    Active member of club or organization: No  Attends meetings of clubs or organizations: Never    Relationship status: Married  . Intimate partner violence    Fear of current or ex partner: No    Emotionally abused: No    Physically abused: No    Forced sexual activity: No  Other Topics Concern  . Not on file  Social History Narrative  . Not on file    Vital Signs: Blood pressure (!) 168/90, pulse 79, resp. rate 16, height 5\' 5"  (1.651 m), weight 298 lb 9.6 oz (135.4 kg), SpO2 97 %.  Examination: General Appearance: The patient is well-developed, well-nourished, and in no distress. Skin: Gross inspection of skin unremarkable. Head:  normocephalic, no gross deformities. Eyes: no gross deformities noted. ENT: ears appear grossly normal no exudates. Neck: Supple. No thyromegaly. No LAD. Respiratory: clear bilaterally. Cardiovascular: Normal S1 and S2 without murmur or rub. Extremities: No cyanosis. pulses are equal. Neurologic: Alert and oriented. No involuntary movements.  LABS: Recent Results (from the past 2160 hour(s))  CBC with Differential/Platelet     Status: None   Collection Time: 09/26/18  1:33 PM  Result Value Ref Range   WBC 5.0 3.4 - 10.8 x10E3/uL   RBC 4.66 3.77 - 5.28 x10E6/uL   Hemoglobin 12.7 11.1 - 15.9 g/dL   Hematocrit 39.1 34.0 - 46.6 %   MCV 84 79 - 97 fL   MCH 27.3 26.6 - 33.0 pg   MCHC 32.5 31.5 - 35.7 g/dL   RDW 12.7 11.7 - 15.4 %   Platelets 240 150 - 450 x10E3/uL   Neutrophils 62 Not Estab. %   Lymphs 22 Not Estab. %   Monocytes 10 Not Estab. %   Eos 4 Not Estab. %   Basos 1 Not Estab. %   Neutrophils Absolute 3.1 1.4 - 7.0 x10E3/uL   Lymphocytes Absolute 1.1 0.7 - 3.1 x10E3/uL   Monocytes Absolute 0.5 0.1 - 0.9 x10E3/uL   EOS (ABSOLUTE) 0.2 0.0 - 0.4 x10E3/uL   Basophils Absolute 0.0 0.0 - 0.2 x10E3/uL   Immature Granulocytes 1 Not Estab. %   Immature Grans (Abs) 0.0 0.0 - 0.1 x10E3/uL  Comprehensive metabolic panel     Status: None   Collection Time: 09/26/18  1:33 PM  Result Value Ref Range   Glucose 93 65 - 99 mg/dL   BUN 12 6 - 24 mg/dL   Creatinine, Ser 0.85 0.57 - 1.00 mg/dL   GFR calc non Af Amer 77 >59 mL/min/1.73   GFR calc Af Amer 89 >59 mL/min/1.73   BUN/Creatinine Ratio 14 9 - 23   Sodium 140 134 - 144 mmol/L   Potassium 4.6 3.5 - 5.2 mmol/L   Chloride 104 96 - 106 mmol/L   CO2 23 20 - 29 mmol/L   Calcium 9.1 8.7 - 10.2 mg/dL   Total Protein 6.8 6.0 - 8.5 g/dL   Albumin 4.0 3.8 - 4.9 g/dL   Globulin, Total 2.8 1.5 - 4.5 g/dL   Albumin/Globulin Ratio 1.4 1.2 - 2.2   Bilirubin Total 0.3 0.0 - 1.2 mg/dL   Alkaline Phosphatase 71 39 - 117 IU/L   AST 14 0 -  40 IU/L   ALT 14 0 - 32 IU/L  Lipid Panel With LDL/HDL Ratio     Status: None   Collection Time: 09/26/18  1:33 PM  Result Value Ref Range   Cholesterol, Total 130 100 - 199 mg/dL   Triglycerides 122 0 - 149 mg/dL   HDL 69 >39 mg/dL   VLDL Cholesterol  Cal 24 5 - 40 mg/dL   LDL Calculated 37 0 - 99 mg/dL   LDl/HDL Ratio 0.5 0.0 - 3.2 ratio    Comment:                                     LDL/HDL Ratio                                             Men  Women                               1/2 Avg.Risk  1.0    1.5                                   Avg.Risk  3.6    3.2                                2X Avg.Risk  6.2    5.0                                3X Avg.Risk  8.0    6.1   T4, free     Status: None   Collection Time: 09/26/18  1:33 PM  Result Value Ref Range   Free T4 0.98 0.82 - 1.77 ng/dL  TSH     Status: None   Collection Time: 09/26/18  1:33 PM  Result Value Ref Range   TSH 1.500 0.450 - 4.500 uIU/mL    Radiology: Dg Cervical Spine Complete  Result Date: 06/02/2016 CLINICAL DATA:  Neck pain. EXAM: CERVICAL SPINE - COMPLETE 4+ VIEW COMPARISON:  No recent prior. FINDINGS: Diffuse multilevel degenerative change with loss of normal cervical lordosis. Degenerative changes most prominent from C4 through C7. No evidence of acute fracture or dislocation. Pulmonary apices are clear. IMPRESSION: Negative cervical spine radiographs. Electronically Signed   By: Marcello Moores  Register   On: 06/02/2016 17:23   Dg Shoulder Right  Result Date: 06/02/2016 CLINICAL DATA:  58 year old female with pain right neck extending into right shoulder. No known injury. Initial encounter. EXAM: RIGHT SHOULDER - 2+ VIEW COMPARISON:  None. FINDINGS: Moderate acromioclavicular joint degenerative changes. No abnormal soft tissue calcifications. No fracture or dislocation. Visualized lungs clear. IMPRESSION: Moderate acromioclavicular joint degenerative changes. Electronically Signed   By: Genia Del M.D.   On:  06/02/2016 17:22    No results found.  No results found.    Assessment and Plan: Patient Active Problem List   Diagnosis Date Noted  . Acute non-recurrent sinusitis 12/01/2017  . Allergic conjunctivitis of both eyes 12/01/2017  . Allergic rhinitis 12/01/2017  . Hypertension 11/19/2017  . Asthma 11/19/2017    1. Nocturnal hypoxia Will get overnight oximetry to assess for nocturnal hypoxia.  - Pulse oximetry, overnight; Future  2. Hypersomnia Pt continues to report hypersomnia. No   3. Hypertension, unspecified type Elevated today, 168/90. Encouraged patient to follow up with PCP.   General Counseling: I have discussed the findings of the evaluation and examination with Lattie Haw.  I have  also discussed any further diagnostic evaluation thatmay be needed or ordered today. Marlea verbalizes understanding of the findings of todays visit. We also reviewed her medications today and discussed drug interactions and side effects including but not limited excessive drowsiness and altered mental states. We also discussed that there is always a risk not just to her but also people around her. she has been encouraged to call the office with any questions or concerns that should arise related to todays visit.    Time spent: 15 This patient was seen by Orson Gear AGNP-C in Collaboration with Dr. Devona Konig as a part of collaborative care agreement.   I have personally obtained a history, examined the patient, evaluated laboratory and imaging results, formulated the assessment and plan and placed orders.    [name], MD [facility] Pulmonary and Critical Care Sleep medicine

## 2018-10-25 ENCOUNTER — Encounter: Payer: Self-pay | Admitting: Adult Health

## 2018-11-01 ENCOUNTER — Other Ambulatory Visit: Payer: Self-pay

## 2018-11-01 ENCOUNTER — Ambulatory Visit: Payer: Managed Care, Other (non HMO) | Admitting: Adult Health

## 2018-11-01 ENCOUNTER — Encounter: Payer: Self-pay | Admitting: Adult Health

## 2018-11-01 ENCOUNTER — Ambulatory Visit: Payer: 59 | Admitting: Licensed Clinical Social Worker

## 2018-11-01 DIAGNOSIS — I10 Essential (primary) hypertension: Secondary | ICD-10-CM

## 2018-11-01 DIAGNOSIS — G4734 Idiopathic sleep related nonobstructive alveolar hypoventilation: Secondary | ICD-10-CM | POA: Diagnosis not present

## 2018-11-01 NOTE — Progress Notes (Signed)
[facility] [place], [place] [address]  Internal MEDICINE  Telephone Visit  Patient Name: April George  [id number]  [id number]  Date of Service: 11/16/2018  I connected with the patient at 414 by telephone and verified the patients identity using two identifiers.   I discussed the limitations, risks, security and privacy concerns of performing an evaluation and management service by telephone and the availability of in person appointments. I also discussed with the patient that there may be a patient responsible charge related to the service.  The patient expressed understanding and agrees to proceed.    Chief Complaint  Patient presents with  . Telephone Screen    overnight ox follow up   . Telephone Assessment    HPI  Pt is seen via telephone, to review her overnight oximetry results.  Her study shows 73.5 minutes below 88%. She will require night time oxygen.  The results were reviewed with patient and she is agreeable to having oxygen at night.    Current Medication: Outpatient Encounter Medications as of 11/01/2018  Medication Sig  . albuterol (PROVENTIL HFA;VENTOLIN HFA) 108 (90 BASE) MCG/ACT inhaler Inhale 2 puffs into the lungs every 6 (six) hours as needed for wheezing or shortness of breath.  . celecoxib (CELEBREX) 50 MG capsule Take 1 capsule (50 mg total) by mouth 2 (two) times daily.  Marland Kitchen desloratadine (CLARINEX REDITAB) 5 MG disintegrating tablet Take 1 tablet (5 mg total) by mouth daily as needed.  . furosemide (LASIX) 20 MG tablet Take 1 tablet (20 mg total) by mouth daily.  . hydrOXYzine (ATARAX/VISTARIL) 10 MG tablet Take 1-2 tablets (10-20 mg total) by mouth 2 (two) times daily as needed. For severe anxiety  . lisinopril (ZESTRIL) 20 MG tablet Take 1.5 tablets (30 mg total) by mouth daily.   No facility-administered encounter medications on file as of 11/01/2018.     Surgical History: Past Surgical History:  Procedure Laterality Date  .  ABDOMINAL HYSTERECTOMY      Medical History: Past Medical History:  Diagnosis Date  . Asthma   . Hypertension     Family History: Family History  Problem Relation Age of Onset  . Breast cancer Mother 43  . Breast cancer Cousin   . Depression Sister   . Anxiety disorder Sister     Social History   Socioeconomic History  . Marital status: Married    Spouse name: Lanny Hurst  . Number of children: 2  . Years of education: Not on file  . Highest education level: Associate degree: occupational, Hotel manager, or vocational program  Occupational History  . Not on file  Social Needs  . Financial resource strain: Not hard at all  . Food insecurity    Worry: Never true    Inability: Never true  . Transportation needs    Medical: No    Non-medical: No  Tobacco Use  . Smoking status: Never Smoker  . Smokeless tobacco: Never Used  Substance and Sexual Activity  . Alcohol use: No  . Drug use: No  . Sexual activity: Yes  Lifestyle  . Physical activity    Days per week: 0 days    Minutes per session: 0 min  . Stress: Not at all  Relationships  . Social Herbalist on phone: Not on file    Gets together: Not on file    Attends religious service: More than 4 times per year    Active member of club or  organization: No    Attends meetings of clubs or organizations: Never    Relationship status: Married  . Intimate partner violence    Fear of current or ex partner: No    Emotionally abused: No    Physically abused: No    Forced sexual activity: No  Other Topics Concern  . Not on file  Social History Narrative  . Not on file      Review of Systems  Constitutional: Negative for chills, fatigue and unexpected weight change.  HENT: Negative for congestion, rhinorrhea, sneezing and sore throat.   Eyes: Negative for photophobia, pain and redness.  Respiratory: Negative for cough, chest tightness and shortness of breath.   Cardiovascular: Negative for chest pain and  palpitations.  Gastrointestinal: Negative for abdominal pain, constipation, diarrhea, nausea and vomiting.  Endocrine: Negative.   Genitourinary: Negative for dysuria and frequency.  Musculoskeletal: Negative for arthralgias, back pain, joint swelling and neck pain.  Skin: Negative for rash.  Allergic/Immunologic: Negative.   Neurological: Negative for tremors and numbness.  Hematological: Negative for adenopathy. Does not bruise/bleed easily.  Psychiatric/Behavioral: Negative for behavioral problems and sleep disturbance. The patient is not nervous/anxious.     Vital Signs: There were no vitals taken for this visit.   Observation/Objective:  Well appearing, NAD noted.    Assessment/Plan: 1. Nocturnal hypoxia Will order oxygen for patient to wear at night 2 lpm.  - For home use only DME oxygen  2. Hypertension, unspecified type Stable, continue present management.   General Counseling: ceola para understanding of the findings of today's phone visit and agrees with plan of treatment. I have discussed any further diagnostic evaluation that may be needed or ordered today. We also reviewed her medications today. she has been encouraged to call the office with any questions or concerns that should arise related to todays visit.    Orders Placed This Encounter  Procedures  . For home use only DME oxygen    No orders of the defined types were placed in this encounter.   Time spent: [place] AGNP-C Internal medicine

## 2018-11-09 ENCOUNTER — Encounter: Payer: Self-pay | Admitting: Adult Health

## 2018-11-16 ENCOUNTER — Other Ambulatory Visit: Payer: Self-pay

## 2018-11-16 ENCOUNTER — Ambulatory Visit (INDEPENDENT_AMBULATORY_CARE_PROVIDER_SITE_OTHER): Payer: Self-pay | Admitting: Psychiatry

## 2018-11-16 DIAGNOSIS — Z5329 Procedure and treatment not carried out because of patient's decision for other reasons: Secondary | ICD-10-CM

## 2018-11-16 NOTE — Progress Notes (Signed)
No response to calls 

## 2018-11-30 ENCOUNTER — Other Ambulatory Visit: Payer: Self-pay

## 2018-11-30 DIAGNOSIS — I10 Essential (primary) hypertension: Secondary | ICD-10-CM

## 2018-11-30 MED ORDER — LISINOPRIL 20 MG PO TABS: 30.0000 mg | ORAL_TABLET | Freq: Every day | ORAL | 0 refills | Status: DC

## 2018-12-01 ENCOUNTER — Telehealth: Payer: Self-pay

## 2018-12-01 NOTE — Telephone Encounter (Signed)
CMN SIGNED AND PLACED IN [employer] PATIENT FOLDER. °

## 2019-01-30 ENCOUNTER — Ambulatory Visit (INDEPENDENT_AMBULATORY_CARE_PROVIDER_SITE_OTHER): Payer: Managed Care, Other (non HMO) | Admitting: Nurse Practitioner

## 2019-01-30 ENCOUNTER — Encounter: Payer: Self-pay | Admitting: Nurse Practitioner

## 2019-01-30 ENCOUNTER — Other Ambulatory Visit: Payer: Self-pay

## 2019-01-30 VITALS — BP 146/70 | HR 87 | Temp 97.2°F | Resp 16 | Ht 65.0 in | Wt 305.0 lb

## 2019-01-30 DIAGNOSIS — J309 Allergic rhinitis, unspecified: Secondary | ICD-10-CM

## 2019-01-30 DIAGNOSIS — R0600 Dyspnea, unspecified: Secondary | ICD-10-CM

## 2019-01-30 DIAGNOSIS — Z1231 Encounter for screening mammogram for malignant neoplasm of breast: Secondary | ICD-10-CM

## 2019-01-30 DIAGNOSIS — L700 Acne vulgaris: Secondary | ICD-10-CM

## 2019-01-30 DIAGNOSIS — M1990 Unspecified osteoarthritis, unspecified site: Secondary | ICD-10-CM

## 2019-01-30 DIAGNOSIS — R600 Localized edema: Secondary | ICD-10-CM | POA: Diagnosis not present

## 2019-01-30 DIAGNOSIS — I10 Essential (primary) hypertension: Secondary | ICD-10-CM | POA: Diagnosis not present

## 2019-01-30 DIAGNOSIS — R300 Dysuria: Secondary | ICD-10-CM

## 2019-01-30 DIAGNOSIS — R0609 Other forms of dyspnea: Secondary | ICD-10-CM

## 2019-01-30 DIAGNOSIS — Z0001 Encounter for general adult medical examination with abnormal findings: Secondary | ICD-10-CM | POA: Diagnosis not present

## 2019-01-30 DIAGNOSIS — Z1211 Encounter for screening for malignant neoplasm of colon: Secondary | ICD-10-CM

## 2019-01-30 MED ORDER — FLUTICASONE PROPIONATE 50 MCG/ACT NA SUSP: 2.0000 | Freq: Every day | NASAL | 3 refills | Status: DC

## 2019-01-30 MED ORDER — FUROSEMIDE 20 MG PO TABS: 20.0000 mg | ORAL_TABLET | Freq: Every day | ORAL | 1 refills | Status: DC

## 2019-01-30 MED ORDER — TRETINOIN 0.01 % EX GEL: Freq: Every day | CUTANEOUS | 1 refills | Status: DC

## 2019-01-30 MED ORDER — LISINOPRIL 20 MG PO TABS: 30.0000 mg | ORAL_TABLET | Freq: Every day | ORAL | 1 refills | Status: DC

## 2019-01-30 MED ORDER — CELECOXIB 50 MG PO CAPS: 50.0000 mg | ORAL_CAPSULE | Freq: Two times a day (BID) | ORAL | 2 refills | Status: DC

## 2019-01-30 NOTE — Progress Notes (Signed)
[facility] [place], [place] [address]  Internal MEDICINE  Office Visit Note  Patient Name: April George  [id number]  [id number]  Date of Service: 02/17/2019   Pt is here for routine health maintenance examination  Chief Complaint  Patient presents with  . Annual Exam  . Hypertension  . Quality Metric Gaps    colonoscopy  . Sinusitis     The patient is here for health maintenance exam. She states that she has developed a dark spot on her right lower leg. Does not hurt or itch. She just noticed it all of a sudden. She states that she has sinus headache today. She also has bilateral knee pain, currently worse on the left side. She has seen orthopedics in the past and was told that she had arthritis in her knee. This radiates into her back. Walks different with the pain in her knees. She states that she lost her mom in June, two years ago, and her son got married in September. She states that she still mises them both. She does state that she gets short of breath with exertion. Tires out very quickly. She also has some swelling present in both lower legs. She is due to have screening mammogram and colonoscopy. She had routine, fasting labs done in July, 2020, and all were within normal limits.     Current Medication: Outpatient Encounter Medications as of 01/30/2019  Medication Sig  . albuterol (PROVENTIL HFA;VENTOLIN HFA) 108 (90 BASE) MCG/ACT inhaler Inhale 2 puffs into the lungs every 6 (six) hours as needed for wheezing or shortness of breath.  . celecoxib (CELEBREX) 50 MG capsule Take 1 capsule (50 mg total) by mouth 2 (two) times daily.  Marland Kitchen desloratadine (CLARINEX REDITAB) 5 MG disintegrating tablet Take 1 tablet (5 mg total) by mouth daily as needed.  . fluticasone (FLONASE) 50 MCG/ACT nasal spray Place 2 sprays into both nostrils daily.  . furosemide (LASIX) 20 MG tablet Take 1 tablet (20 mg total) by mouth daily.  . hydrOXYzine (ATARAX/VISTARIL) 10 MG  tablet Take 1-2 tablets (10-20 mg total) by mouth 2 (two) times daily as needed. For severe anxiety  . lisinopril (ZESTRIL) 20 MG tablet Take 1.5 tablets (30 mg total) by mouth daily.  Marland Kitchen tretinoin (RETIN-A) 0.01 % gel Apply topically at bedtime.  . [DISCONTINUED] celecoxib (CELEBREX) 50 MG capsule Take 1 capsule (50 mg total) by mouth 2 (two) times daily.  . [DISCONTINUED] furosemide (LASIX) 20 MG tablet Take 1 tablet (20 mg total) by mouth daily.  . [DISCONTINUED] lisinopril (ZESTRIL) 20 MG tablet Take 1.5 tablets (30 mg total) by mouth daily.   No facility-administered encounter medications on file as of 01/30/2019.     Surgical History: Past Surgical History:  Procedure Laterality Date  . ABDOMINAL HYSTERECTOMY      Medical History: Past Medical History:  Diagnosis Date  . Asthma   . Hypertension     Family History: Family History  Problem Relation Age of Onset  . Breast cancer Mother 17  . Breast cancer Cousin   . Depression Sister   . Anxiety disorder Sister       Review of Systems  Constitutional: Positive for fatigue. Negative for chills and unexpected weight change.  HENT: Positive for congestion, postnasal drip and sinus pain. Negative for rhinorrhea, sneezing and sore throat.   Respiratory: Negative for cough, chest tightness, shortness of breath and wheezing.   Cardiovascular: Negative for chest pain and palpitations.  Mildly elevated blood pressure. She has shortness of breath with exertion.   Gastrointestinal: Negative for abdominal pain, constipation, diarrhea, nausea and vomiting.  Endocrine: Negative for cold intolerance, heat intolerance, polydipsia and polyuria.  Genitourinary: Negative for flank pain, frequency, hematuria and urgency.  Musculoskeletal: Positive for arthralgias, back pain and myalgias. Negative for joint swelling and neck pain.       Bilateral knees.   Skin: Negative for rash.       Small, darkened area of skin on right lower leg.  Does not hurt or itch.   Allergic/Immunologic: Negative for environmental allergies.  Neurological: Negative for dizziness, tremors, numbness and headaches.  Hematological: Negative for adenopathy. Does not bruise/bleed easily.  Psychiatric/Behavioral: Negative for behavioral problems (Depression), sleep disturbance and suicidal ideas. The patient is not nervous/anxious.      Today's Vitals   01/30/19 1404  BP: (!) 146/70  Pulse: 87  Resp: 16  Temp: (!) 97.2 F (36.2 C)  SpO2: 97%  Weight: (!) 305 lb (138.3 kg)  Height: 5\' 5"  (1.651 m)   Body mass index is 50.75 kg/m.  Physical Exam Vitals signs and nursing note reviewed.  Constitutional:      General: She is not in acute distress.    Appearance: Normal appearance. She is well-developed. She is obese. She is not diaphoretic.  HENT:     Head: Normocephalic and atraumatic.     Nose: Nose normal.     Comments: Mild frontal sinus tenderness with palpation.     Mouth/Throat:     Pharynx: No oropharyngeal exudate.  Eyes:     Extraocular Movements: Extraocular movements intact.     Pupils: Pupils are equal, round, and reactive to light.  Neck:     Musculoskeletal: Normal range of motion and neck supple.     Thyroid: No thyromegaly.     Vascular: No carotid bruit or JVD.     Trachea: No tracheal deviation.  Cardiovascular:     Rate and Rhythm: Normal rate and regular rhythm.     Pulses: Normal pulses.     Heart sounds: Normal heart sounds. No murmur. No friction rub. No gallop.   Pulmonary:     Effort: Pulmonary effort is normal. No respiratory distress.     Breath sounds: Normal breath sounds. No wheezing or rales.  Chest:     Chest wall: No tenderness.     Breasts:        Right: Normal. No swelling, bleeding, inverted nipple, mass, nipple discharge, skin change or tenderness.        Left: Normal. No swelling, bleeding, inverted nipple, mass, nipple discharge, skin change or tenderness.  Abdominal:     General: Bowel  sounds are normal.     Palpations: Abdomen is soft.     Tenderness: There is no abdominal tenderness.  Musculoskeletal: Normal range of motion.     Comments: Bilateral knee pain with mild swelling. There is crepitus with flexion and extension. ROM and strength are mildly reduced due to pain.   Lymphadenopathy:     Cervical: No cervical adenopathy.     Upper Body:     Right upper body: No axillary adenopathy.     Left upper body: No axillary adenopathy.  Skin:    General: Skin is warm and dry.     Comments: Small, darkened area on right lower leg. This is flush with surrounding skin. Skin is intact and there is no drainage present.   Neurological:     Mental  Status: She is alert and oriented to person, place, and time.     Cranial Nerves: No cranial nerve deficit.  Psychiatric:        Mood and Affect: Mood normal.        Behavior: Behavior normal.        Thought Content: Thought content normal.        Judgment: Judgment normal.      LABS: Recent Results (from the past 2160 hour(s))  urinalysis/with culture     Status: None   Collection Time: 01/30/19  2:10 PM   Specimen: Urine   URINE  Result Value Ref Range   Specific Gravity, UA 1.025 1.005 - 1.030   pH, UA 5.0 5.0 - 7.5   Color, UA Yellow Yellow   Appearance Ur Clear Clear   Leukocytes,UA Negative Negative   Protein,UA Negative Negative/Trace   Glucose, UA Negative Negative   Ketones, UA Negative Negative   RBC, UA Negative Negative   Bilirubin, UA Negative Negative   Urobilinogen, Ur 0.2 0.2 - 1.0 mg/dL   Nitrite, UA Negative Negative   Microscopic Examination Comment     Comment: Microscopic follows if indicated.   Microscopic Examination See below:     Comment: Microscopic was indicated and was performed.   Urinalysis Reflex Comment     Comment: This specimen will not reflex to a Urine Culture.  Microscopic Examination     Status: None   Collection Time: 01/30/19  2:10 PM   URINE  Result Value Ref Range    WBC, UA 0-5 0 - 5 /hpf   RBC 0-2 0 - 2 /hpf   Epithelial Cells (non renal) 0-10 0 - 10 /hpf   Casts None seen None seen /lpf   Mucus, UA Present Not Estab.   Bacteria, UA None seen None seen/Few    Assessment/Plan: 1. Encounter for general adult medical examination with abnormal findings Annual health maintenance exam today.   2. Hypertension, unspecified type Increase lisinopril to 30mg  daily. New prescription sent to her pharmacy. Monitor blood pressures closely.  - lisinopril (ZESTRIL) 20 MG tablet; Take 1.5 tablets (30 mg total) by mouth daily.  Dispense: 135 tablet; Refill: 1  3. Lower extremity edema Continue furosemide 20mg  daily.  - furosemide (LASIX) 20 MG tablet; Take 1 tablet (20 mg total) by mouth daily.  Dispense: 90 tablet; Refill: 1  4. Dyspnea on exertion Will get echocardiogram for further evaluation.  - ECHOCARDIOGRAM COMPLETE; Future  5. Osteoarthritis, unspecified osteoarthritis type, unspecified site May take celebrex 50mg  twice daily to reduce pain and inflammation. Recommend she return to orthopedics for further evaluation and treatment if persistent.   - celecoxib (CELEBREX) 50 MG capsule; Take 1 capsule (50 mg total) by mouth 2 (two) times daily.  Dispense: 60 capsule; Refill: 2  6. Chronic allergic rhinitis - fluticasone (FLONASE) 50 MCG/ACT nasal spray; Place 2 sprays into both nostrils daily.  Dispense: 48 g; Refill: 3  7. Acne vulgaris - tretinoin (RETIN-A) 0.01 % gel; Apply topically at bedtime.  Dispense: 45 g; Refill: 1  8. Screening for colon cancer Refer to GI for screening colonoscopy.  - Ambulatory referral to Gastroenterology  9. Encounter for screening mammogram for malignant neoplasm of breast - scrrening mammo; Future  10. Dysuria - urinalysis/with culture  General Counseling: April George verbalizes understanding of the findings of todays visit and agrees with plan of treatment. I have discussed any further diagnostic evaluation that may be  needed or ordered today. We also reviewed  her medications today. she has been encouraged to call the office with any questions or concerns that should arise related to todays visit.    Counseling:  Hypertension Counseling:   The following hypertensive lifestyle modification were recommended and discussed:  1. Limiting alcohol intake to less than 1 oz/day of ethanol:(24 oz of beer or 8 oz of wine or 2 oz of 100-proof whiskey). 2. Take baby ASA 81 mg daily. 3. Importance of regular aerobic exercise and losing weight. 4. Reduce dietary saturated fat and cholesterol intake for overall cardiovascular health. 5. Maintaining adequate dietary potassium, calcium, and magnesium intake. 6. Regular monitoring of the blood pressure. 7. Reduce sodium intake to less than 100 mmol/day (less than 2.3 gm of sodium or less than 6 gm of sodium choride)   This patient was seen by [place] with Dr Lavera Guise as a part of collaborative care agreement  Orders Placed This Encounter  Procedures  . Microscopic Examination  . scrrening mammo  . urinalysis/with culture  . Ambulatory referral to Gastroenterology  . ECHOCARDIOGRAM COMPLETE    Meds ordered this encounter  Medications  . celecoxib (CELEBREX) 50 MG capsule    Sig: Take 1 capsule (50 mg total) by mouth 2 (two) times daily.    Dispense:  60 capsule    Refill:  2    Order Specific Question:   Supervising Provider    Answer:   Lavera Guise [id number]  . lisinopril (ZESTRIL) 20 MG tablet    Sig: Take 1.5 tablets (30 mg total) by mouth daily.    Dispense:  135 tablet    Refill:  1    Order Specific Question:   Supervising Provider    Answer:   Lavera Guise [id number]  . furosemide (LASIX) 20 MG tablet    Sig: Take 1 tablet (20 mg total) by mouth daily.    Dispense:  90 tablet    Refill:  1    Order Specific Question:   Supervising Provider    Answer:   Lavera Guise [id number]  . fluticasone (FLONASE) 50 MCG/ACT nasal spray     Sig: Place 2 sprays into both nostrils daily.    Dispense:  48 g    Refill:  3    Order Specific Question:   Supervising Provider    Answer:   Lavera Guise [id number]  . tretinoin (RETIN-A) 0.01 % gel    Sig: Apply topically at bedtime.    Dispense:  45 g    Refill:  1    Please fill with generic alternative preferred by patient's insurance. Thanks.    Order Specific Question:   Supervising Provider    Answer:   Lavera Guise [id number]    Time spent: [place], MD  Internal Medicine

## 2019-01-31 LAB — UA/M W/RFLX CULTURE, ROUTINE
Bilirubin, UA: NEGATIVE
Glucose, UA: NEGATIVE
Ketones, UA: NEGATIVE
Leukocytes,UA: NEGATIVE
Nitrite, UA: NEGATIVE
Protein,UA: NEGATIVE
RBC, UA: NEGATIVE
Specific Gravity, UA: 1.025 (ref 1.005–1.030)
Urobilinogen, Ur: 0.2 mg/dL (ref 0.2–1.0)
pH, UA: 5 (ref 5.0–7.5)

## 2019-01-31 LAB — MICROSCOPIC EXAMINATION
Bacteria, UA: NONE SEEN
Casts: NONE SEEN /lpf

## 2019-02-01 ENCOUNTER — Other Ambulatory Visit: Payer: Self-pay

## 2019-02-01 ENCOUNTER — Telehealth: Payer: Self-pay

## 2019-02-01 DIAGNOSIS — Z800 Family history of malignant neoplasm of digestive organs: Secondary | ICD-10-CM

## 2019-02-01 DIAGNOSIS — Z1211 Encounter for screening for malignant neoplasm of colon: Secondary | ICD-10-CM

## 2019-02-01 MED ORDER — NA SULFATE-K SULFATE-MG SULF 17.5-3.13-1.6 GM/177ML PO SOLN: 1.0000 | Freq: Once | ORAL | 0 refills | Status: AC

## 2019-02-01 NOTE — Telephone Encounter (Signed)
Gastroenterology Pre-Procedure Review  Request Date: Thursday 02/23/19 Requesting Physician: Dr. Vicente Males  PATIENT REVIEW QUESTIONS: The patient responded to the following health history questions as indicated:    1. Are you having any GI issues? no 2. Do you have a personal history of Polyps? no 3. Do you have a family history of Colon Cancer or Polyps? yes (Father and sister had colon cancer) 4. Diabetes Mellitus? no 5. Joint replacements in the past 12 months?no 6. Major health problems in the past 3 months?no 7. Any artificial heart valves, MVP, or defibrillator?no    MEDICATIONS & ALLERGIES:    Patient reports the following regarding taking any anticoagulation/antiplatelet therapy:   Plavix, Coumadin, Eliquis, Xarelto, Lovenox, Pradaxa, Brilinta, or Effient? no Aspirin? no  Patient confirms/reports the following medications:  Current Outpatient Medications  Medication Sig Dispense Refill  . albuterol (PROVENTIL HFA;VENTOLIN HFA) 108 (90 BASE) MCG/ACT inhaler Inhale 2 puffs into the lungs every 6 (six) hours as needed for wheezing or shortness of breath.    . celecoxib (CELEBREX) 50 MG capsule Take 1 capsule (50 mg total) by mouth 2 (two) times daily. 60 capsule 2  . desloratadine (CLARINEX REDITAB) 5 MG disintegrating tablet Take 1 tablet (5 mg total) by mouth daily as needed. 30 tablet 5  . fluticasone (FLONASE) 50 MCG/ACT nasal spray Place 2 sprays into both nostrils daily. 48 g 3  . furosemide (LASIX) 20 MG tablet Take 1 tablet (20 mg total) by mouth daily. 90 tablet 1  . hydrOXYzine (ATARAX/VISTARIL) 10 MG tablet Take 1-2 tablets (10-20 mg total) by mouth 2 (two) times daily as needed. For severe anxiety 120 tablet 0  . lisinopril (ZESTRIL) 20 MG tablet Take 1.5 tablets (30 mg total) by mouth daily. 135 tablet 1  . tretinoin (RETIN-A) 0.01 % gel Apply topically at bedtime. 45 g 1   No current facility-administered medications for this visit.     Patient confirms/reports the  following allergies:  Allergies  Allergen Reactions  . Codeine Other (See Comments)    Altered mental status     No orders of the defined types were placed in this encounter.   AUTHORIZATION INFORMATION Primary Insurance: 1D#: Group #:  Secondary Insurance: 1D#: Group #:  SCHEDULE INFORMATION: Date: 02/23/19 Time: Location:

## 2019-02-15 ENCOUNTER — Telehealth: Payer: Self-pay

## 2019-02-15 NOTE — Telephone Encounter (Signed)
Let message advising pt of ultrasound echo benefit pricing. April George

## 2019-02-17 DIAGNOSIS — Z0001 Encounter for general adult medical examination with abnormal findings: Secondary | ICD-10-CM | POA: Insufficient documentation

## 2019-02-17 DIAGNOSIS — L700 Acne vulgaris: Secondary | ICD-10-CM | POA: Insufficient documentation

## 2019-02-17 DIAGNOSIS — Z1231 Encounter for screening mammogram for malignant neoplasm of breast: Secondary | ICD-10-CM | POA: Insufficient documentation

## 2019-02-17 DIAGNOSIS — R0609 Other forms of dyspnea: Secondary | ICD-10-CM | POA: Insufficient documentation

## 2019-02-17 DIAGNOSIS — M1990 Unspecified osteoarthritis, unspecified site: Secondary | ICD-10-CM | POA: Insufficient documentation

## 2019-02-17 DIAGNOSIS — R300 Dysuria: Secondary | ICD-10-CM | POA: Insufficient documentation

## 2019-02-17 DIAGNOSIS — Z1211 Encounter for screening for malignant neoplasm of colon: Secondary | ICD-10-CM | POA: Insufficient documentation

## 2019-02-17 DIAGNOSIS — R600 Localized edema: Secondary | ICD-10-CM | POA: Insufficient documentation

## 2019-02-20 ENCOUNTER — Other Ambulatory Visit
Admission: RE | Admit: 2019-02-20 | Discharge: 2019-02-20 | Disposition: A | Discharge status: Home / Self Care | Payer: Managed Care, Other (non HMO) | Source: Ambulatory Visit | Attending: Gastroenterology | Admitting: Gastroenterology

## 2019-02-20 ENCOUNTER — Other Ambulatory Visit: Payer: Self-pay

## 2019-02-20 DIAGNOSIS — Z01812 Encounter for preprocedural laboratory examination: Secondary | ICD-10-CM | POA: Diagnosis present

## 2019-02-20 DIAGNOSIS — Z20828 Contact with and (suspected) exposure to other viral communicable diseases: Secondary | ICD-10-CM | POA: Insufficient documentation

## 2019-02-21 LAB — SARS CORONAVIRUS 2 (TAT 6-24 HRS): SARS Coronavirus 2: NEGATIVE

## 2019-02-22 ENCOUNTER — Telehealth: Payer: Self-pay

## 2019-02-22 ENCOUNTER — Telehealth: Payer: Self-pay | Admitting: Gastroenterology

## 2019-02-22 NOTE — Telephone Encounter (Signed)
Pt left vm she has a procedure tomorrow and would like to know if she can have egg drop soup today please call pt

## 2019-02-22 NOTE — Telephone Encounter (Signed)
Confirmed patient ultrasound 02/24/19. April George

## 2019-02-22 NOTE — Telephone Encounter (Signed)
Patients call returned.  She has been advised that she may have the liquid only portion of the egg drop soup-avoiding any solid food contents the entire day and to make sure she remains well hydrated.  Thanks [employer]

## 2019-02-23 ENCOUNTER — Ambulatory Visit: Payer: Managed Care, Other (non HMO) | Admitting: Anesthesiology

## 2019-02-23 ENCOUNTER — Encounter
Admission: RE | Disposition: A | Discharge status: Home / Self Care | Payer: Self-pay | Source: Home / Self Care | Attending: Gastroenterology

## 2019-02-23 ENCOUNTER — Encounter: Payer: Self-pay | Admitting: *Deleted

## 2019-02-23 ENCOUNTER — Ambulatory Visit
Admission: RE | Admit: 2019-02-23 | Discharge: 2019-02-23 | Disposition: A | Discharge status: Home / Self Care | Payer: Managed Care, Other (non HMO) | Attending: Gastroenterology | Admitting: Gastroenterology

## 2019-02-23 ENCOUNTER — Other Ambulatory Visit: Payer: Self-pay

## 2019-02-23 DIAGNOSIS — Z791 Long term (current) use of non-steroidal anti-inflammatories (NSAID): Secondary | ICD-10-CM | POA: Insufficient documentation

## 2019-02-23 DIAGNOSIS — I10 Essential (primary) hypertension: Secondary | ICD-10-CM | POA: Diagnosis not present

## 2019-02-23 DIAGNOSIS — Z6841 Body Mass Index (BMI) 40.0 and over, adult: Secondary | ICD-10-CM | POA: Insufficient documentation

## 2019-02-23 DIAGNOSIS — J45909 Unspecified asthma, uncomplicated: Secondary | ICD-10-CM | POA: Insufficient documentation

## 2019-02-23 DIAGNOSIS — M1990 Unspecified osteoarthritis, unspecified site: Secondary | ICD-10-CM | POA: Diagnosis not present

## 2019-02-23 DIAGNOSIS — K5730 Diverticulosis of large intestine without perforation or abscess without bleeding: Secondary | ICD-10-CM | POA: Diagnosis not present

## 2019-02-23 DIAGNOSIS — Z800 Family history of malignant neoplasm of digestive organs: Secondary | ICD-10-CM | POA: Insufficient documentation

## 2019-02-23 DIAGNOSIS — Z1211 Encounter for screening for malignant neoplasm of colon: Secondary | ICD-10-CM | POA: Insufficient documentation

## 2019-02-23 DIAGNOSIS — Z885 Allergy status to narcotic agent status: Secondary | ICD-10-CM | POA: Insufficient documentation

## 2019-02-23 DIAGNOSIS — D125 Benign neoplasm of sigmoid colon: Secondary | ICD-10-CM | POA: Insufficient documentation

## 2019-02-23 DIAGNOSIS — Z79899 Other long term (current) drug therapy: Secondary | ICD-10-CM | POA: Diagnosis not present

## 2019-02-23 DIAGNOSIS — K635 Polyp of colon: Secondary | ICD-10-CM

## 2019-02-23 DIAGNOSIS — D122 Benign neoplasm of ascending colon: Secondary | ICD-10-CM | POA: Diagnosis not present

## 2019-02-23 DIAGNOSIS — E662 Morbid (severe) obesity with alveolar hypoventilation: Secondary | ICD-10-CM | POA: Insufficient documentation

## 2019-02-23 HISTORY — PX: COLONOSCOPY WITH PROPOFOL: SHX5780

## 2019-02-23 SURGERY — COLONOSCOPY WITH PROPOFOL
Anesthesia: General

## 2019-02-23 MED ORDER — PROPOFOL 500 MG/50ML IV EMUL
INTRAVENOUS | Status: DC | PRN
  Administered 2019-02-23: 120 ug/kg/min via INTRAVENOUS

## 2019-02-23 MED ORDER — ACETAMINOPHEN 500 MG PO TABS
1000.0000 mg | ORAL_TABLET | Freq: Once | ORAL | Status: AC
  Administered 2019-02-23: 10:00:00 1000 mg via ORAL

## 2019-02-23 MED ORDER — ACETAMINOPHEN 500 MG PO TABS
ORAL_TABLET | ORAL | Status: AC
  Filled 2019-02-23: qty 2

## 2019-02-23 MED ORDER — LIDOCAINE HCL (PF) 2 % IJ SOLN
INTRAMUSCULAR | Status: AC
  Filled 2019-02-23: qty 10

## 2019-02-23 MED ORDER — LIDOCAINE HCL (CARDIAC) PF 100 MG/5ML IV SOSY
PREFILLED_SYRINGE | INTRAVENOUS | Status: DC | PRN
  Administered 2019-02-23: 60 mg via INTRAVENOUS

## 2019-02-23 MED ORDER — SODIUM CHLORIDE 0.9 % IV SOLN
INTRAVENOUS | Status: DC
  Administered 2019-02-23: 09:00:00 via INTRAVENOUS

## 2019-02-23 MED ORDER — PROPOFOL 10 MG/ML IV BOLUS
INTRAVENOUS | Status: DC | PRN
  Administered 2019-02-23: 20 mg via INTRAVENOUS
  Administered 2019-02-23: 60 mg via INTRAVENOUS

## 2019-02-23 MED ORDER — PROPOFOL 10 MG/ML IV BOLUS
INTRAVENOUS | Status: AC
  Filled 2019-02-23: qty 20

## 2019-02-23 MED ORDER — LIDOCAINE HCL (PF) 1 % IJ SOLN
INTRAMUSCULAR | Status: AC
  Administered 2019-02-23: 2 mL
  Filled 2019-02-23: qty 2

## 2019-02-23 NOTE — Anesthesia Preprocedure Evaluation (Signed)
Anesthesia Evaluation  Patient identified by MRN, date of birth, ID band Patient awake    Reviewed: Allergy & Precautions, H&P , NPO status , Patient's Chart, lab work & pertinent test results  History of Anesthesia Complications Negative for: history of anesthetic complications  Airway Mallampati: III  TM Distance: >3 FB Neck ROM: full    Dental  (+) Teeth Intact   Pulmonary shortness of breath and with exertion, asthma ,  Pt wears 2L [place] at night, states it is due to a "heart problem," denies OSA but on further questioning describes obesity hypoventilation syndrome, does not wear CPAP          Cardiovascular Exercise Tolerance: Poor hypertension, (-) angina(-) Cardiac Stents (-) dysrhythmias      Neuro/Psych negative neurological ROS  negative psych ROS   GI/Hepatic negative GI ROS, Neg liver ROS,   Endo/Other  Morbid obesity (super morbid obesity BMI 50)  Renal/GU negative Renal ROS  negative genitourinary   Musculoskeletal   Abdominal   Peds  Hematology negative hematology ROS (+)   Anesthesia Other Findings Past Medical History: No date: Asthma No date: Hypertension  Past Surgical History: No date: ABDOMINAL HYSTERECTOMY No date: CARPAL TUNNEL RELEASE; Bilateral No date: COLONOSCOPY  BMI    Body Mass Index: 50.26 kg/m      Reproductive/Obstetrics negative OB ROS                             Anesthesia Physical Anesthesia Plan  ASA: III  Anesthesia Plan: General   Post-op Pain Management:    Induction:   PONV Risk Score and Plan: Propofol infusion and TIVA  Airway Management Planned: Simple Face Mask and Natural Airway  Additional Equipment:   Intra-op Plan:   Post-operative Plan:   Informed Consent: I have reviewed the patients History and Physical, chart, labs and discussed the procedure including the risks, benefits and alternatives for the proposed  anesthesia with the patient or authorized representative who has indicated his/her understanding and acceptance.     Dental Advisory Given  Plan Discussed with: Anesthesiologist  Anesthesia Plan Comments:         Anesthesia Quick Evaluation

## 2019-02-23 NOTE — Transfer of Care (Signed)
Immediate Anesthesia Transfer of Care Note  Patient: ZELINA PAOLETTI  Procedure(s) Performed: COLONOSCOPY WITH PROPOFOL (N/A )  Patient Location: PACU  Anesthesia Type:General  Level of Consciousness: sedated  Airway & Oxygen Therapy: Patient Spontanous Breathing and Patient connected to nasal cannula oxygen  Post-op Assessment: Report given to RN and Post -op Vital signs reviewed and stable  Post vital signs: Reviewed and stable  Last Vitals:  Vitals Value Taken Time  BP 122/74 02/23/19 0931  Temp    Pulse 91 02/23/19 0931  Resp 23 02/23/19 0931  SpO2 99 % 02/23/19 0931  Vitals shown include unvalidated device data.  Last Pain:  Vitals:   02/23/19 0802  TempSrc: Temporal  PainSc: 0-No pain         Complications: No apparent anesthesia complications

## 2019-02-23 NOTE — Anesthesia Post-op Follow-up Note (Signed)
Anesthesia QCDR form completed.        

## 2019-02-23 NOTE — H&P (Signed)
Jonathon Bellows, MD [address] [place], [place], [place], [address] [address], [place], [place], [place], [address] Phone: [phone]  Fax: [phone]  Primary Care Physician:  Ronnell Freshwater, NP   Pre-Procedure History & Physical: HPI:  April George is a 58 y.o. female is here for an colonoscopy.   Past Medical History:  Diagnosis Date  . Asthma   . Hypertension     Past Surgical History:  Procedure Laterality Date  . ABDOMINAL HYSTERECTOMY    . CARPAL TUNNEL RELEASE Bilateral   . COLONOSCOPY      Prior to Admission medications   Medication Sig Start Date End Date Taking? Authorizing Provider  albuterol (PROVENTIL HFA;VENTOLIN HFA) 108 (90 BASE) MCG/ACT inhaler Inhale 2 puffs into the lungs every 6 (six) hours as needed for wheezing or shortness of breath.   Yes [provider]  celecoxib (CELEBREX) 50 MG capsule Take 1 capsule (50 mg total) by mouth 2 (two) times daily. 01/30/19  Yes Boscia, Greer Ee, NP  desloratadine (CLARINEX REDITAB) 5 MG disintegrating tablet Take 1 tablet (5 mg total) by mouth daily as needed. 11/19/17  Yes Boscia, Heather E, NP  fluticasone (FLONASE) 50 MCG/ACT nasal spray Place 2 sprays into both nostrils daily. 01/30/19  Yes Boscia, Heather E, NP  lisinopril (ZESTRIL) 20 MG tablet Take 1.5 tablets (30 mg total) by mouth daily. 01/30/19  Yes Boscia, Greer Ee, NP  furosemide (LASIX) 20 MG tablet Take 1 tablet (20 mg total) by mouth daily. Patient not taking: Reported on 02/23/2019 01/30/19   Ronnell Freshwater, NP  hydrOXYzine (ATARAX/VISTARIL) 10 MG tablet Take 1-2 tablets (10-20 mg total) by mouth 2 (two) times daily as needed. For severe anxiety Patient not taking: Reported on 02/23/2019 07/12/18   Ursula Alert, MD  tretinoin (RETIN-A) 0.01 % gel Apply topically at bedtime. Patient not taking: Reported on 02/23/2019 01/30/19   Ronnell Freshwater, NP    Allergies as of 02/01/2019 - Review Complete 01/30/2019  Allergen Reaction  Noted  . Codeine Other (See Comments) 12/02/2014    Family History  Problem Relation Age of Onset  . Breast cancer Mother 18  . Breast cancer Cousin   . Depression Sister   . Anxiety disorder Sister     Social History   Socioeconomic History  . Marital status: Married    Spouse name: Lanny Hurst  . Number of children: 2  . Years of education: Not on file  . Highest education level: Associate degree: occupational, Hotel manager, or vocational program  Occupational History  . Not on file  Social Needs  . Financial resource strain: Not hard at all  . Food insecurity    Worry: Never true    Inability: Never true  . Transportation needs    Medical: No    Non-medical: No  Tobacco Use  . Smoking status: Never Smoker  . Smokeless tobacco: Never Used  Substance and Sexual Activity  . Alcohol use: No  . Drug use: No  . Sexual activity: Yes  Lifestyle  . Physical activity    Days per week: 0 days    Minutes per session: 0 min  . Stress: Not at all  Relationships  . Social Herbalist on phone: Not on file    Gets together: Not on file    Attends religious service: More than 4 times per year    Active member of club or organization: No    Attends meetings  of clubs or organizations: Never    Relationship status: Married  . Intimate partner violence    Fear of current or ex partner: No    Emotionally abused: No    Physically abused: No    Forced sexual activity: No  Other Topics Concern  . Not on file  Social History Narrative  . Not on file    Review of Systems: See HPI, otherwise negative ROS  Physical Exam: BP (!) 191/89   Pulse 92   Temp (!) 97.1 F (36.2 C) (Temporal)   Resp 18   Ht 5\' 5"  (1.651 m)   Wt (!) 137 kg   SpO2 100%   BMI 50.26 kg/m  General:   Alert,  pleasant and cooperative in NAD Head:  Normocephalic and atraumatic. Neck:  Supple; no masses or thyromegaly. Lungs:  Clear throughout to auscultation, normal respiratory effort.    Heart:   +S1, +S2, Regular rate and rhythm, No edema. Abdomen:  Soft, nontender and nondistended. Normal bowel sounds, without guarding, and without rebound.   Neurologic:  Alert and  oriented x4;  grossly normal neurologically.  Impression/Plan: [name] is here for an colonoscopy to be performed for family history of colon cancer. Risks, benefits, limitations, and alternatives regarding  colonoscopy have been reviewed with the patient.  Questions have been answered.  All parties agreeable.   Jonathon Bellows, MD  02/23/2019, 8:49 AM

## 2019-02-23 NOTE — Op Note (Signed)
[facility] Patient Name: April George Procedure Date: 02/23/2019 8:53 AM MRN: [id number] Account #: [id number] Date of Birth: 1960/11/24 Admit Type: Outpatient Age: 58 Room: [facility] ENDO ROOM 4 Gender: Female Note Status: Finalized Procedure:             Colonoscopy Indications:           Screening in patient at increased risk: Family history                         of 1st-degree relative with colorectal cancer Providers:             Jonathon Bellows MD, MD Referring MD:          Lavera Guise, MD (Referring MD) Medicines:             Monitored Anesthesia Care Complications:         No immediate complications. Procedure:             Pre-Anesthesia Assessment:                        - Prior to the procedure, a History and Physical was                         performed, and patient medications, allergies and                         sensitivities were reviewed. The patient's tolerance                         of previous anesthesia was reviewed.                        - The risks and benefits of the procedure and the                         sedation options and risks were discussed with the                         patient. All questions were answered and informed                         consent was obtained.                        - ASA Grade Assessment: II - A patient with mild                         systemic disease.                        After obtaining informed consent, the colonoscope was                         passed under direct vision. Throughout the procedure,                         the patient's blood pressure, pulse, and oxygen                         saturations were  monitored continuously. The                         Colonoscope was introduced through the anus and                         advanced to the the cecum, identified by the                         appendiceal orifice. The colonoscopy was performed                         with ease.  The patient tolerated the procedure well.                         The quality of the bowel preparation was excellent. Findings:      The perianal and digital rectal examinations were normal.      Multiple small-mouthed diverticula were found in the sigmoid colon.      Three sessile polyps were found in the sigmoid colon and ascending       colon. The polyps were 5 to 7 mm in size. These polyps were removed with       a cold snare. Resection and retrieval were complete.      The exam was otherwise without abnormality on direct and retroflexion       views. Impression:            - Diverticulosis in the sigmoid colon.                        - Three 5 to 7 mm polyps in the sigmoid colon and in                         the ascending colon, removed with a cold snare.                         Resected and retrieved.                        - The examination was otherwise normal on direct and                         retroflexion views. Recommendation:        - Discharge patient to home (with escort).                        - Resume previous diet.                        - Continue present medications.                        - Await pathology results.                        - Repeat colonoscopy for surveillance based on                         pathology results. Procedure Code(s):     --- Professional ---  45385, Colonoscopy, flexible; with removal of                         tumor(s), polyp(s), or other lesion(s) by snare                         technique Diagnosis Code(s):     --- Professional ---                        Z80.0, Family history of malignant neoplasm of                         digestive organs                        K63.5, Polyp of colon                        K57.30, Diverticulosis of large intestine without                         perforation or abscess without bleeding CPT copyright 2019 American Medical Association. All rights reserved. The codes  documented in this report are preliminary and upon coder review may  be revised to meet current compliance requirements. Jonathon Bellows, MD Jonathon Bellows MD, MD 02/23/2019 9:25:24 AM This report has been signed electronically. Number of Addenda: 0 Note Initiated On: 02/23/2019 8:53 AM Scope Withdrawal Time: 0 hours 11 minutes 58 seconds  Total Procedure Duration: 0 hours 17 minutes 21 seconds  Estimated Blood Loss:  Estimated blood loss: none.      [facility]

## 2019-02-24 ENCOUNTER — Encounter: Payer: Self-pay | Admitting: Gastroenterology

## 2019-02-24 ENCOUNTER — Ambulatory Visit: Payer: Managed Care, Other (non HMO)

## 2019-02-24 DIAGNOSIS — R0602 Shortness of breath: Secondary | ICD-10-CM | POA: Diagnosis not present

## 2019-02-24 DIAGNOSIS — R0609 Other forms of dyspnea: Secondary | ICD-10-CM

## 2019-02-24 LAB — SURGICAL PATHOLOGY

## 2019-02-26 ENCOUNTER — Encounter: Payer: Self-pay | Admitting: Gastroenterology

## 2019-02-27 NOTE — Anesthesia Postprocedure Evaluation (Signed)
Anesthesia Post Note  Patient: April George  Procedure(s) Performed: COLONOSCOPY WITH PROPOFOL (N/A )  Patient location during evaluation: PACU Anesthesia Type: General Level of consciousness: awake and alert Pain management: pain level controlled Vital Signs Assessment: post-procedure vital signs reviewed and stable Respiratory status: spontaneous breathing, nonlabored ventilation and respiratory function stable Cardiovascular status: blood pressure returned to baseline and stable Postop Assessment: no apparent nausea or vomiting Anesthetic complications: no     Last Vitals:  Vitals:   02/23/19 0941 02/23/19 0951  BP: (!) 155/83 (!) 167/85  Pulse: 90 83  Resp: (!) 21 17  Temp:    SpO2: 100% 100%    Last Pain:  Vitals:   02/24/19 0735  TempSrc:   PainSc: 0-No pain                 Durenda Hurt

## 2019-03-01 ENCOUNTER — Telehealth: Payer: Self-pay

## 2019-03-01 NOTE — Telephone Encounter (Signed)
LMOM TO CONFIRM AND SCREEN FOR 03-03-19 OV.

## 2019-03-02 ENCOUNTER — Telehealth: Payer: Self-pay

## 2019-03-02 NOTE — Telephone Encounter (Signed)
Confirmed appointment with patient. klh °

## 2019-03-03 ENCOUNTER — Encounter: Payer: Self-pay | Admitting: Nurse Practitioner

## 2019-03-03 ENCOUNTER — Ambulatory Visit: Payer: Managed Care, Other (non HMO) | Admitting: Nurse Practitioner

## 2019-03-03 ENCOUNTER — Other Ambulatory Visit: Payer: Self-pay

## 2019-03-03 VITALS — BP 206/93 | HR 85 | Temp 97.5°F | Resp 16 | Ht 65.0 in | Wt 313.0 lb

## 2019-03-03 DIAGNOSIS — R600 Localized edema: Secondary | ICD-10-CM

## 2019-03-03 DIAGNOSIS — M1990 Unspecified osteoarthritis, unspecified site: Secondary | ICD-10-CM

## 2019-03-03 DIAGNOSIS — Z23 Encounter for immunization: Secondary | ICD-10-CM

## 2019-03-03 DIAGNOSIS — L700 Acne vulgaris: Secondary | ICD-10-CM

## 2019-03-03 DIAGNOSIS — I10 Essential (primary) hypertension: Secondary | ICD-10-CM | POA: Diagnosis not present

## 2019-03-03 MED ORDER — CELECOXIB 50 MG PO CAPS: 50.0000 mg | ORAL_CAPSULE | Freq: Two times a day (BID) | ORAL | 2 refills | Status: DC

## 2019-03-03 MED ORDER — TRETINOIN 0.01 % EX GEL: Freq: Every day | CUTANEOUS | 1 refills | Status: DC

## 2019-03-03 MED ORDER — FUROSEMIDE 20 MG PO TABS: 20.0000 mg | ORAL_TABLET | Freq: Every day | ORAL | 1 refills | Status: DC

## 2019-03-03 MED ORDER — LISINOPRIL 20 MG PO TABS: 30.0000 mg | ORAL_TABLET | Freq: Every day | ORAL | 1 refills | Status: DC

## 2019-03-03 NOTE — Progress Notes (Signed)
[facility] [place], [place] [address]  Internal MEDICINE  Office Visit Note  Patient Name: April George  [id number]  [id number]  Date of Service: 03/05/2019  Chief Complaint  Patient presents with  . Follow-up    echo results  . Hypertension    cannot find bp meds and has not been taking medication for last 3 days    The patient is here for routine follow up. Her blood pressure is significantly elevated today. She states that when she was decorating her home for the holidays and she thinks she threw away her bottles of blood pressure medication. She has been without it for at least three days. She is interested in going back on stimulant appetite suppressant, however, her blood pressure must be better controlled in order to restart this medication.  She had echocardiogram since her last visit. She has normal LVEF and normal diastolic function.  She has trace mitral and tricuspid regurgitation.  She has had screening colonoscopy since her last visit. There were three polyps found. She is to have repeat study done in three years.       Current Medication: Outpatient Encounter Medications as of 03/03/2019  Medication Sig  . albuterol (PROVENTIL HFA;VENTOLIN HFA) 108 (90 BASE) MCG/ACT inhaler Inhale 2 puffs into the lungs every 6 (six) hours as needed for wheezing or shortness of breath.  . celecoxib (CELEBREX) 50 MG capsule Take 1 capsule (50 mg total) by mouth 2 (two) times daily.  Marland Kitchen desloratadine (CLARINEX REDITAB) 5 MG disintegrating tablet Take 1 tablet (5 mg total) by mouth daily as needed.  . fluticasone (FLONASE) 50 MCG/ACT nasal spray Place 2 sprays into both nostrils daily.  . furosemide (LASIX) 20 MG tablet Take 1 tablet (20 mg total) by mouth daily.  . hydrOXYzine (ATARAX/VISTARIL) 10 MG tablet Take 1-2 tablets (10-20 mg total) by mouth 2 (two) times daily as needed. For severe anxiety (Patient not taking: Reported on 02/23/2019)  . lisinopril  (ZESTRIL) 20 MG tablet Take 1.5 tablets (30 mg total) by mouth daily.  Marland Kitchen tretinoin (RETIN-A) 0.01 % gel Apply topically at bedtime.  . [DISCONTINUED] celecoxib (CELEBREX) 50 MG capsule Take 1 capsule (50 mg total) by mouth 2 (two) times daily.  . [DISCONTINUED] furosemide (LASIX) 20 MG tablet Take 1 tablet (20 mg total) by mouth daily. (Patient not taking: Reported on 02/23/2019)  . [DISCONTINUED] lisinopril (ZESTRIL) 20 MG tablet Take 1.5 tablets (30 mg total) by mouth daily.  . [DISCONTINUED] tretinoin (RETIN-A) 0.01 % gel Apply topically at bedtime. (Patient not taking: Reported on 02/23/2019)   No facility-administered encounter medications on file as of 03/03/2019.    Surgical History: Past Surgical History:  Procedure Laterality Date  . ABDOMINAL HYSTERECTOMY    . CARPAL TUNNEL RELEASE Bilateral   . COLONOSCOPY    . COLONOSCOPY WITH PROPOFOL N/A 02/23/2019   Procedure: COLONOSCOPY WITH PROPOFOL;  Surgeon: Jonathon Bellows, MD;  Location: [facility] ENDOSCOPY;  Service: Gastroenterology;  Laterality: N/A;    Medical History: Past Medical History:  Diagnosis Date  . Asthma   . Hypertension     Family History: Family History  Problem Relation Age of Onset  . Breast cancer Mother 21  . Breast cancer Cousin   . Depression Sister   . Anxiety disorder Sister     Social History   Socioeconomic History  . Marital status: Married    Spouse name: Lanny Hurst  . Number of children: 2  . Years of education: Not  on file  . Highest education level: Associate degree: occupational, Hotel manager, or vocational program  Occupational History  . Not on file  Tobacco Use  . Smoking status: Never Smoker  . Smokeless tobacco: Never Used  Substance and Sexual Activity  . Alcohol use: No  . Drug use: No  . Sexual activity: Yes  Other Topics Concern  . Not on file  Social History Narrative  . Not on file   Social Determinants of Health   Financial Resource Strain: Low Risk   . Difficulty of Paying  Living Expenses: Not hard at all  Food Insecurity: No Food Insecurity  . Worried About Charity fundraiser in the Last Year: Never true  . Ran Out of Food in the Last Year: Never true  Transportation Needs: No Transportation Needs  . Lack of Transportation (Medical): No  . Lack of Transportation (Non-Medical): No  Physical Activity: Inactive  . Days of Exercise per Week: 0 days  . Minutes of Exercise per Session: 0 min  Stress: No Stress Concern Present  . Feeling of Stress : Not at all  Social Connections: Unknown  . Frequency of Communication with Friends and Family: Not on file  . Frequency of Social Gatherings with Friends and Family: Not on file  . Attends Religious Services: More than 4 times per year  . Active Member of Clubs or Organizations: No  . Attends Archivist Meetings: Never  . Marital Status: Married  Human resources officer Violence: Not At Risk  . Fear of Current or Ex-Partner: No  . Emotionally Abused: No  . Physically Abused: No  . Sexually Abused: No      Review of Systems  Constitutional: Positive for fatigue. Negative for activity change, chills and unexpected weight change.  HENT: Negative for congestion, postnasal drip, rhinorrhea, sinus pain, sneezing and sore throat.   Respiratory: Negative for cough, chest tightness, shortness of breath and wheezing.   Cardiovascular: Negative for chest pain and palpitations.       Severely elevated blood pressure today.   Gastrointestinal: Negative for abdominal pain, constipation, diarrhea, nausea and vomiting.  Endocrine: Negative for cold intolerance, heat intolerance, polydipsia and polyuria.  Genitourinary: Negative for flank pain, frequency, hematuria and urgency.  Musculoskeletal: Positive for arthralgias, back pain and myalgias. Negative for joint swelling and neck pain.       Bilateral knees.   Skin: Negative for rash.       Small, darkened area of skin on right lower leg. Does not hurt or itch.    Allergic/Immunologic: Negative for environmental allergies.  Neurological: Negative for dizziness, tremors, numbness and headaches.  Hematological: Negative for adenopathy. Does not bruise/bleed easily.  Psychiatric/Behavioral: Negative for behavioral problems (Depression), sleep disturbance and suicidal ideas. The patient is not nervous/anxious.     Today's Vitals   03/03/19 1414  BP: (!) 206/93  Pulse: 85  Resp: 16  Temp: (!) 97.5 F (36.4 C)  SpO2: 96%  Weight: (!) 313 lb (142 kg)  Height: 5\' 5"  (1.651 m)   Body mass index is 52.09 kg/m.  Physical Exam Vitals and nursing note reviewed.  Constitutional:      General: She is not in acute distress.    Appearance: Normal appearance. She is well-developed. She is obese. She is not diaphoretic.  HENT:     Head: Normocephalic and atraumatic.     Mouth/Throat:     Pharynx: No oropharyngeal exudate.  Eyes:     Pupils: Pupils are equal, round,  and reactive to light.  Neck:     Thyroid: No thyromegaly.     Vascular: No carotid bruit or JVD.     Trachea: No tracheal deviation.  Cardiovascular:     Rate and Rhythm: Normal rate and regular rhythm.     Heart sounds: Normal heart sounds. No murmur. No friction rub. No gallop.   Pulmonary:     Effort: Pulmonary effort is normal. No respiratory distress.     Breath sounds: Normal breath sounds. No wheezing or rales.  Chest:     Chest wall: No tenderness.  Abdominal:     Palpations: Abdomen is soft.  Musculoskeletal:        General: Normal range of motion.     Cervical back: Normal range of motion and neck supple.     Comments: She has bilateral knee pain with swelling. ROM and strength are mildly reduced due to pain.   Lymphadenopathy:     Cervical: No cervical adenopathy.  Skin:    General: Skin is warm and dry.  Neurological:     Mental Status: She is alert and oriented to person, place, and time.     Cranial Nerves: No cranial nerve deficit.  Psychiatric:         Behavior: Behavior normal.        Thought Content: Thought content normal.        Judgment: Judgment normal.    Assessment/Plan: 1. Hypertension, unspecified type New prescription for blood pressure medication sent to her pharmacy.  - lisinopril (ZESTRIL) 20 MG tablet; Take 1.5 tablets (30 mg total) by mouth daily.  Dispense: 135 tablet; Refill: 1  2. Lower extremity edema Take furosemide as needed and as prescribed. New prescription sent to her pharmacy - furosemide (LASIX) 20 MG tablet; Take 1 tablet (20 mg total) by mouth daily.  Dispense: 90 tablet; Refill: 1  3. Osteoarthritis, unspecified osteoarthritis type, unspecified site Take celebrex 50mg  twice daily as needed. New prescription sent to her pharmacy. - celecoxib (CELEBREX) 50 MG capsule; Take 1 capsule (50 mg total) by mouth 2 (two) times daily.  Dispense: 60 capsule; Refill: 2  4. Acne vulgaris - tretinoin (RETIN-A) 0.01 % gel; Apply topically at bedtime.  Dispense: 45 g; Refill: 1  5. Flu vaccine need Flu shot administered in the office today.  - Flu Vaccine  General Counseling: lierin liska understanding of the findings of todays visit and agrees with plan of treatment. I have discussed any further diagnostic evaluation that may be needed or ordered today. We also reviewed her medications today. she has been encouraged to call the office with any questions or concerns that should arise related to todays visit.  Hypertension Counseling:   The following hypertensive lifestyle modification were recommended and discussed:  1. Limiting alcohol intake to less than 1 oz/day of ethanol:(24 oz of beer or 8 oz of wine or 2 oz of 100-proof whiskey). 2. Take baby ASA 81 mg daily. 3. Importance of regular aerobic exercise and losing weight. 4. Reduce dietary saturated fat and cholesterol intake for overall cardiovascular health. 5. Maintaining adequate dietary potassium, calcium, and magnesium intake. 6. Regular monitoring of  the blood pressure. 7. Reduce sodium intake to less than 100 mmol/day (less than 2.3 gm of sodium or less than 6 gm of sodium choride)   This patient was seen by [place] with Dr Lavera Guise as a part of collaborative care agreement  Orders Placed This Encounter  Procedures  .  Flu Vaccine    Meds ordered this encounter  Medications  . celecoxib (CELEBREX) 50 MG capsule    Sig: Take 1 capsule (50 mg total) by mouth 2 (two) times daily.    Dispense:  60 capsule    Refill:  2    Please fill now as patient accidentally threw away her previous prescription. Thanks.    Order Specific Question:   Supervising Provider    Answer:   Lavera Guise [id number]  . furosemide (LASIX) 20 MG tablet    Sig: Take 1 tablet (20 mg total) by mouth daily.    Dispense:  90 tablet    Refill:  1    Please fill now as patient accidentally threw away her previous prescription. Thanks.    Order Specific Question:   Supervising Provider    Answer:   Lavera Guise [id number]  . lisinopril (ZESTRIL) 20 MG tablet    Sig: Take 1.5 tablets (30 mg total) by mouth daily.    Dispense:  135 tablet    Refill:  1    Please fill now as patient accidentally threw away her previous prescription. Thanks.    Order Specific Question:   Supervising Provider    Answer:   Lavera Guise [id number]  . tretinoin (RETIN-A) 0.01 % gel    Sig: Apply topically at bedtime.    Dispense:  45 g    Refill:  1    Please fill with generic alternative preferred by patient's insurance. Thanks    Order Specific Question:   Supervising Provider    Answer:   Lavera Guise [id number]    Time spent: 66 Minutes      Dr Lavera Guise Internal medicine

## 2019-03-05 DIAGNOSIS — Z23 Encounter for immunization: Secondary | ICD-10-CM | POA: Insufficient documentation

## 2019-03-06 NOTE — Progress Notes (Signed)
Discussed with patient at her last visit.

## 2019-03-21 ENCOUNTER — Ambulatory Visit: Payer: Managed Care, Other (non HMO) | Attending: Internal Medicine

## 2019-03-21 DIAGNOSIS — Z20822 Contact with and (suspected) exposure to covid-19: Secondary | ICD-10-CM

## 2019-03-22 ENCOUNTER — Telehealth: Payer: Self-pay | Admitting: *Deleted

## 2019-03-22 LAB — NOVEL CORONAVIRUS, NAA: SARS-CoV-2, NAA: NOT DETECTED

## 2019-03-22 NOTE — Telephone Encounter (Signed)
Patient called for results ,still pending . 

## 2019-03-23 ENCOUNTER — Telehealth: Payer: Self-pay

## 2019-03-23 NOTE — Telephone Encounter (Signed)
CONFIRMED 03-28-19 OV AS VIRTUAL CURRENTLY WAITING FOR COIVD RESULTS.

## 2019-03-23 NOTE — Telephone Encounter (Signed)
Caller given negative result and verbalized understanding  

## 2019-03-28 ENCOUNTER — Other Ambulatory Visit: Payer: Self-pay

## 2019-03-28 ENCOUNTER — Ambulatory Visit: Payer: Managed Care, Other (non HMO) | Admitting: Adult Health

## 2019-03-28 VITALS — BP 160/85 | HR 100 | Temp 97.2°F | Resp 16 | Ht 65.0 in | Wt 309.0 lb

## 2019-03-28 DIAGNOSIS — I10 Essential (primary) hypertension: Secondary | ICD-10-CM | POA: Diagnosis not present

## 2019-03-28 DIAGNOSIS — J4520 Mild intermittent asthma, uncomplicated: Secondary | ICD-10-CM | POA: Diagnosis not present

## 2019-03-28 DIAGNOSIS — Z6841 Body Mass Index (BMI) 40.0 and over, adult: Secondary | ICD-10-CM | POA: Diagnosis not present

## 2019-03-28 MED ORDER — AMLODIPINE BESYLATE 10 MG PO TABS: 10.0000 mg | ORAL_TABLET | Freq: Every day | ORAL | 1 refills | Status: DC

## 2019-03-28 MED ORDER — ALBUTEROL SULFATE HFA 108 (90 BASE) MCG/ACT IN AERS: 2.0000 | INHALATION_SPRAY | Freq: Four times a day (QID) | RESPIRATORY_TRACT | 3 refills | Status: DC | PRN

## 2019-03-28 NOTE — Progress Notes (Signed)
[facility] [place], [place] [address]  Internal MEDICINE  Office Visit Note  Patient Name: April George  [id number]  [id number]  Date of Service: 03/28/2019  Chief Complaint  Patient presents with  . Medical Management of Chronic Issues    weight management   . Hypertension  . Asthma  . Medication Refill    albuterol inhaler    HPI  PT is here for follow up on HTN, asthma, and weight loss.  Her lisinopril was increased to 30mg  and her bp has decreased, but it remains elevated.  She reports her asthma has been acting up and is requesting refill on albuterol inhaler. She would still like to try medication for weight loss, however she understands she can not do that until her bp is under control.   Current Medication: Outpatient Encounter Medications as of 03/28/2019  Medication Sig  . albuterol (PROVENTIL HFA;VENTOLIN HFA) 108 (90 BASE) MCG/ACT inhaler Inhale 2 puffs into the lungs every 6 (six) hours as needed for wheezing or shortness of breath.  . celecoxib (CELEBREX) 50 MG capsule Take 1 capsule (50 mg total) by mouth 2 (two) times daily.  Marland Kitchen desloratadine (CLARINEX REDITAB) 5 MG disintegrating tablet Take 1 tablet (5 mg total) by mouth daily as needed.  . fluticasone (FLONASE) 50 MCG/ACT nasal spray Place 2 sprays into both nostrils daily.  . furosemide (LASIX) 20 MG tablet Take 1 tablet (20 mg total) by mouth daily.  . hydrOXYzine (ATARAX/VISTARIL) 10 MG tablet Take 1-2 tablets (10-20 mg total) by mouth 2 (two) times daily as needed. For severe anxiety  . lisinopril (ZESTRIL) 20 MG tablet Take 1.5 tablets (30 mg total) by mouth daily.  Marland Kitchen tretinoin (RETIN-A) 0.01 % gel Apply topically at bedtime.  Marland Kitchen amLODipine (NORVASC) 10 MG tablet Take 1 tablet (10 mg total) by mouth daily.   No facility-administered encounter medications on file as of 03/28/2019.    Surgical History: Past Surgical History:  Procedure Laterality Date  . ABDOMINAL HYSTERECTOMY    .  CARPAL TUNNEL RELEASE Bilateral   . COLONOSCOPY    . COLONOSCOPY WITH PROPOFOL N/A 02/23/2019   Procedure: COLONOSCOPY WITH PROPOFOL;  Surgeon: Jonathon Bellows, MD;  Location: [facility] ENDOSCOPY;  Service: Gastroenterology;  Laterality: N/A;    Medical History: Past Medical History:  Diagnosis Date  . Asthma   . Hypertension     Family History: Family History  Problem Relation Age of Onset  . Breast cancer Mother 52  . Breast cancer Cousin   . Depression Sister   . Anxiety disorder Sister     Social History   Socioeconomic History  . Marital status: Married    Spouse name: Lanny Hurst  . Number of children: 2  . Years of education: Not on file  . Highest education level: Associate degree: occupational, Hotel manager, or vocational program  Occupational History  . Not on file  Tobacco Use  . Smoking status: Never Smoker  . Smokeless tobacco: Never Used  Substance and Sexual Activity  . Alcohol use: No  . Drug use: No  . Sexual activity: Yes  Other Topics Concern  . Not on file  Social History Narrative  . Not on file   Social Determinants of Health   Financial Resource Strain: Low Risk   . Difficulty of Paying Living Expenses: Not hard at all  Food Insecurity: No Food Insecurity  . Worried About Charity fundraiser in the Last Year: Never true  . Ran Out  of Food in the Last Year: Never true  Transportation Needs: No Transportation Needs  . Lack of Transportation (Medical): No  . Lack of Transportation (Non-Medical): No  Physical Activity: Inactive  . Days of Exercise per Week: 0 days  . Minutes of Exercise per Session: 0 min  Stress: No Stress Concern Present  . Feeling of Stress : Not at all  Social Connections: Unknown  . Frequency of Communication with Friends and Family: Not on file  . Frequency of Social Gatherings with Friends and Family: Not on file  . Attends Religious Services: More than 4 times per year  . Active Member of Clubs or Organizations: No  . Attends  Archivist Meetings: Never  . Marital Status: Married  Human resources officer Violence: Not At Risk  . Fear of Current or Ex-Partner: No  . Emotionally Abused: No  . Physically Abused: No  . Sexually Abused: No      Review of Systems  Constitutional: Negative for chills, fatigue and unexpected weight change.  HENT: Negative for congestion, rhinorrhea, sneezing and sore throat.   Eyes: Negative for photophobia, pain and redness.  Respiratory: Negative for cough, chest tightness and shortness of breath.   Cardiovascular: Negative for chest pain and palpitations.  Gastrointestinal: Negative for abdominal pain, constipation, diarrhea, nausea and vomiting.  Endocrine: Negative.   Genitourinary: Negative for dysuria and frequency.  Musculoskeletal: Negative for arthralgias, back pain, joint swelling and neck pain.  Skin: Negative for rash.  Allergic/Immunologic: Negative.   Neurological: Negative for tremors and numbness.  Hematological: Negative for adenopathy. Does not bruise/bleed easily.  Psychiatric/Behavioral: Negative for behavioral problems and sleep disturbance. The patient is not nervous/anxious.     Vital Signs: BP (!) 160/85   Pulse 100   Temp (!) 97.2 F (36.2 C)   Resp 16   Ht 5\' 5"  (1.651 m)   Wt (!) 309 lb (140.2 kg)   SpO2 98%   BMI 51.42 kg/m    Physical Exam Vitals and nursing note reviewed.  Constitutional:      General: She is not in acute distress.    Appearance: She is well-developed. She is not diaphoretic.  HENT:     Head: Normocephalic and atraumatic.     Mouth/Throat:     Pharynx: No oropharyngeal exudate.  Eyes:     Pupils: Pupils are equal, round, and reactive to light.  Neck:     Thyroid: No thyromegaly.     Vascular: No JVD.     Trachea: No tracheal deviation.  Cardiovascular:     Rate and Rhythm: Normal rate and regular rhythm.     Heart sounds: Normal heart sounds. No murmur. No friction rub. No gallop.   Pulmonary:      Effort: Pulmonary effort is normal. No respiratory distress.     Breath sounds: Normal breath sounds. No wheezing or rales.  Chest:     Chest wall: No tenderness.  Abdominal:     Palpations: Abdomen is soft.     Tenderness: There is no abdominal tenderness. There is no guarding.  Musculoskeletal:        General: Normal range of motion.     Cervical back: Normal range of motion and neck supple.  Lymphadenopathy:     Cervical: No cervical adenopathy.  Skin:    General: Skin is warm and dry.  Neurological:     Mental Status: She is alert and oriented to person, place, and time.     Cranial Nerves: No  cranial nerve deficit.  Psychiatric:        Behavior: Behavior normal.        Thought Content: Thought content normal.        Judgment: Judgment normal.     Assessment/Plan: 1. Hypertension, unspecified type Start Norvasc and follow up in 2 weeks - amLODipine (NORVASC) 10 MG tablet; Take 1 tablet (10 mg total) by mouth daily.  Dispense: 30 tablet; Refill: 1  2. Mild intermittent asthma without complication Use albuterol as directed. - albuterol (VENTOLIN HFA) 108 (90 Base) MCG/ACT inhaler; Inhale 2 puffs into the lungs every 6 (six) hours as needed for wheezing or shortness of breath.  Dispense: 18 g; Refill: 3  3. Class 3 severe obesity due to excess calories with serious comorbidity and body mass index (BMI) of 45.0 to 49.9 in adult [facility]) Obesity Counseling: Risk Assessment: An assessment of behavioral risk factors was made today and includes lack of exercise sedentary lifestyle, lack of portion control and poor dietary habits.  Risk Modification Advice: She was counseled on portion control guidelines. Restricting daily caloric intake to 1800. The detrimental long term effects of obesity on her health and ongoing poor compliance was also discussed with the patient.    General Counseling: jaidynn gideon understanding of the findings of todays visit and agrees with plan of  treatment. I have discussed any further diagnostic evaluation that may be needed or ordered today. We also reviewed her medications today. she has been encouraged to call the office with any questions or concerns that should arise related to todays visit.    No orders of the defined types were placed in this encounter.   Meds ordered this encounter  Medications  . amLODipine (NORVASC) 10 MG tablet    Sig: Take 1 tablet (10 mg total) by mouth daily.    Dispense:  30 tablet    Refill:  1    Time spent: 25 Minutes   This patient was seen by Orson Gear AGNP-C in Collaboration with Dr Lavera Guise as a part of collaborative care agreement     Kendell Bane AGNP-C Internal medicine

## 2019-04-10 ENCOUNTER — Telehealth: Payer: Self-pay

## 2019-04-10 NOTE — Telephone Encounter (Signed)
Confirmed virtual visit with patient. klh 

## 2019-04-12 ENCOUNTER — Ambulatory Visit: Payer: Managed Care, Other (non HMO) | Admitting: Adult Health

## 2019-04-12 ENCOUNTER — Other Ambulatory Visit: Payer: Self-pay

## 2019-04-12 ENCOUNTER — Encounter: Payer: Self-pay | Admitting: Adult Health

## 2019-04-12 VITALS — BP 138/80 | Ht 65.0 in | Wt 306.0 lb

## 2019-04-12 DIAGNOSIS — Z6841 Body Mass Index (BMI) 40.0 and over, adult: Secondary | ICD-10-CM | POA: Diagnosis not present

## 2019-04-12 DIAGNOSIS — J4520 Mild intermittent asthma, uncomplicated: Secondary | ICD-10-CM | POA: Diagnosis not present

## 2019-04-12 DIAGNOSIS — I10 Essential (primary) hypertension: Secondary | ICD-10-CM

## 2019-04-12 NOTE — Progress Notes (Signed)
[facility] [place], [place] [address]  Internal MEDICINE  Telephone Visit  Patient Name: April George  [id number]  [id number]  Date of Service: 04/12/2019  I connected with the patient at 225 by telephone and verified the patients identity using two identifiers.   I discussed the limitations, risks, security and privacy concerns of performing an evaluation and management service by telephone and the availability of in person appointments. I also discussed with the patient that there may be a patient responsible charge related to the service.  The patient expressed understanding and agrees to proceed.    Chief Complaint  Patient presents with  . Telephone Assessment  . Telephone Screen  . Hypertension    HPI  Pt is seen via video. At our last visit Norvasc was added to her bp regimen. Her blood pressure is much better today 138/80.  She Denies Chest pain, Shortness of breath, palpitations, headache, or blurred vision. She reports her asthma is better controlled, now with albuterol inhaler.  She denies any new or worsening symptoms.    Current Medication: Outpatient Encounter Medications as of 04/12/2019  Medication Sig  . albuterol (VENTOLIN HFA) 108 (90 Base) MCG/ACT inhaler Inhale 2 puffs into the lungs every 6 (six) hours as needed for wheezing or shortness of breath.  Marland Kitchen amLODipine (NORVASC) 10 MG tablet Take 1 tablet (10 mg total) by mouth daily.  . celecoxib (CELEBREX) 50 MG capsule Take 1 capsule (50 mg total) by mouth 2 (two) times daily.  Marland Kitchen desloratadine (CLARINEX REDITAB) 5 MG disintegrating tablet Take 1 tablet (5 mg total) by mouth daily as needed.  . fluticasone (FLONASE) 50 MCG/ACT nasal spray Place 2 sprays into both nostrils daily.  . furosemide (LASIX) 20 MG tablet Take 1 tablet (20 mg total) by mouth daily.  . hydrOXYzine (ATARAX/VISTARIL) 10 MG tablet Take 1-2 tablets (10-20 mg total) by mouth 2 (two) times daily as needed. For severe  anxiety  . lisinopril (ZESTRIL) 20 MG tablet Take 1.5 tablets (30 mg total) by mouth daily.  Marland Kitchen tretinoin (RETIN-A) 0.01 % gel Apply topically at bedtime.   No facility-administered encounter medications on file as of 04/12/2019.    Surgical History: Past Surgical History:  Procedure Laterality Date  . ABDOMINAL HYSTERECTOMY    . CARPAL TUNNEL RELEASE Bilateral   . COLONOSCOPY    . COLONOSCOPY WITH PROPOFOL N/A 02/23/2019   Procedure: COLONOSCOPY WITH PROPOFOL;  Surgeon: Jonathon Bellows, MD;  Location: [facility] ENDOSCOPY;  Service: Gastroenterology;  Laterality: N/A;    Medical History: Past Medical History:  Diagnosis Date  . Asthma   . Hypertension     Family History: Family History  Problem Relation Age of Onset  . Breast cancer Mother 46  . Breast cancer Cousin   . Depression Sister   . Anxiety disorder Sister     Social History   Socioeconomic History  . Marital status: Married    Spouse name: Lanny Hurst  . Number of children: 2  . Years of education: Not on file  . Highest education level: Associate degree: occupational, Hotel manager, or vocational program  Occupational History  . Not on file  Tobacco Use  . Smoking status: Never Smoker  . Smokeless tobacco: Never Used  Substance and Sexual Activity  . Alcohol use: No  . Drug use: No  . Sexual activity: Yes  Other Topics Concern  . Not on file  Social History Narrative  . Not on file   Social Determinants of  Health   Financial Resource Strain: Low Risk   . Difficulty of Paying Living Expenses: Not hard at all  Food Insecurity: No Food Insecurity  . Worried About Charity fundraiser in the Last Year: Never true  . Ran Out of Food in the Last Year: Never true  Transportation Needs: No Transportation Needs  . Lack of Transportation (Medical): No  . Lack of Transportation (Non-Medical): No  Physical Activity: Inactive  . Days of Exercise per Week: 0 days  . Minutes of Exercise per Session: 0 min  Stress: No Stress  Concern Present  . Feeling of Stress : Not at all  Social Connections: Unknown  . Frequency of Communication with Friends and Family: Not on file  . Frequency of Social Gatherings with Friends and Family: Not on file  . Attends Religious Services: More than 4 times per year  . Active Member of Clubs or Organizations: No  . Attends Archivist Meetings: Never  . Marital Status: Married  Human resources officer Violence: Not At Risk  . Fear of Current or Ex-Partner: No  . Emotionally Abused: No  . Physically Abused: No  . Sexually Abused: No      Review of Systems  Constitutional: Negative for chills, fatigue and unexpected weight change.  HENT: Negative for congestion, rhinorrhea, sneezing and sore throat.   Eyes: Negative for photophobia, pain and redness.  Respiratory: Negative for cough, chest tightness and shortness of breath.   Cardiovascular: Negative for chest pain and palpitations.  Gastrointestinal: Negative for abdominal pain, constipation, diarrhea, nausea and vomiting.  Endocrine: Negative.   Genitourinary: Negative for dysuria and frequency.  Musculoskeletal: Negative for arthralgias, back pain, joint swelling and neck pain.  Skin: Negative for rash.  Allergic/Immunologic: Negative.   Neurological: Negative for tremors and numbness.  Hematological: Negative for adenopathy. Does not bruise/bleed easily.  Psychiatric/Behavioral: Negative for behavioral problems and sleep disturbance. The patient is not nervous/anxious.     Vital Signs: BP 138/80   Ht 5\' 5"  (1.651 m)   Wt (!) 306 lb (138.8 kg)   BMI 50.92 kg/m    Observation/Objective: Well appearing, NAD noted.     Assessment/Plan: 1. Hypertension, unspecified type Continue norvasc, monitor pressure, follow up in 4 weeks  2. Mild intermittent asthma without complication Stable, discussed taking daily allergy medication to help with lingering symptoms.   3. Class 3 severe obesity due to excess  calories with serious comorbidity and body mass index (BMI) of 45.0 to 49.9 in adult [facility]) Obesity Counseling: Risk Assessment: An assessment of behavioral risk factors was made today and includes lack of exercise sedentary lifestyle, lack of portion control and poor dietary habits.  Risk Modification Advice: She was counseled on portion control guidelines. Restricting daily caloric intake to 1800. The detrimental long term effects of obesity on her health and ongoing poor compliance was also discussed with the patient.    General Counseling: yaslene moix understanding of the findings of today's phone visit and agrees with plan of treatment. I have discussed any further diagnostic evaluation that may be needed or ordered today. We also reviewed her medications today. she has been encouraged to call the office with any questions or concerns that should arise related to todays visit.    No orders of the defined types were placed in this encounter.   No orders of the defined types were placed in this encounter.   Time spent: [facility] Internal medicine

## 2019-05-09 ENCOUNTER — Telehealth: Payer: Self-pay

## 2019-05-09 NOTE — Telephone Encounter (Signed)
CONFIRMED 05-11-19 OV AS VIRTUAL.

## 2019-05-11 ENCOUNTER — Encounter: Payer: Self-pay | Admitting: Nurse Practitioner

## 2019-05-11 ENCOUNTER — Ambulatory Visit (INDEPENDENT_AMBULATORY_CARE_PROVIDER_SITE_OTHER): Payer: Managed Care, Other (non HMO) | Admitting: Nurse Practitioner

## 2019-05-11 ENCOUNTER — Other Ambulatory Visit: Payer: Self-pay

## 2019-05-11 VITALS — Ht 65.0 in | Wt 305.0 lb

## 2019-05-11 DIAGNOSIS — R600 Localized edema: Secondary | ICD-10-CM | POA: Diagnosis not present

## 2019-05-11 DIAGNOSIS — Z6841 Body Mass Index (BMI) 40.0 and over, adult: Secondary | ICD-10-CM | POA: Insufficient documentation

## 2019-05-11 DIAGNOSIS — I10 Essential (primary) hypertension: Secondary | ICD-10-CM

## 2019-05-11 DIAGNOSIS — J4520 Mild intermittent asthma, uncomplicated: Secondary | ICD-10-CM

## 2019-05-11 MED ORDER — FUROSEMIDE 20 MG PO TABS: 20.0000 mg | ORAL_TABLET | Freq: Every day | ORAL | 1 refills | Status: DC

## 2019-05-11 MED ORDER — PHENTERMINE HCL 37.5 MG PO TABS: 37.5000 mg | ORAL_TABLET | Freq: Every day | ORAL | 0 refills | Status: DC

## 2019-05-11 MED ORDER — LISINOPRIL 20 MG PO TABS: 30.0000 mg | ORAL_TABLET | Freq: Every day | ORAL | 1 refills | Status: DC

## 2019-05-11 MED ORDER — ALBUTEROL SULFATE HFA 108 (90 BASE) MCG/ACT IN AERS: 2.0000 | INHALATION_SPRAY | Freq: Four times a day (QID) | RESPIRATORY_TRACT | 3 refills | Status: DC | PRN

## 2019-05-11 MED ORDER — AMLODIPINE BESYLATE 10 MG PO TABS: 10.0000 mg | ORAL_TABLET | Freq: Every day | ORAL | 1 refills | Status: DC

## 2019-05-11 NOTE — Progress Notes (Signed)
[facility] [place], [place] [address]  Internal MEDICINE  Telephone Visit  Patient Name: April George  [id number]  [id number]  Date of Service: 05/11/2019  I connected with the patient at 11:44am by telephone and verified the patients identity using two identifiers.   I discussed the limitations, risks, security and privacy concerns of performing an evaluation and management service by telephone and the availability of in person appointments. I also discussed with the patient that there may be a patient responsible charge related to the service.  The patient expressed understanding and agrees to proceed.    Chief Complaint  Patient presents with  . Telephone Screen  . Telephone Assessment  . Medical Management of Chronic Issues    weight management  . Quality Metric Gaps    mammogram    The patient has been contacted via telephone for follow up visit due to concerns for spread of novel coronavirus. She presents for routine visit. Over past several weeks, her blood pressure has returned to normal. She is currently taking lisinopril 30mg  and lamlodipine 10mg  daily. She takes furosemide as needed for fluid overload. She is ready to restart on phentermine to help with weight loss. She is having a lot of trouble with arthritic pain in her knees and she knows that losing weight will help. She has been on phentermine in the past. Has been successful without negative side effects. She is overdue to have mammogram. A mammogram was ordered in 01/2019 and she states she has forgotten to get this scheduled.       Current Medication: Outpatient Encounter Medications as of 05/11/2019  Medication Sig  . albuterol (VENTOLIN HFA) 108 (90 Base) MCG/ACT inhaler Inhale 2 puffs into the lungs every 6 (six) hours as needed for wheezing or shortness of breath.  Marland Kitchen amLODipine (NORVASC) 10 MG tablet Take 1 tablet (10 mg total) by mouth daily.  . celecoxib (CELEBREX) 50 MG capsule  Take 1 capsule (50 mg total) by mouth 2 (two) times daily.  Marland Kitchen desloratadine (CLARINEX REDITAB) 5 MG disintegrating tablet Take 1 tablet (5 mg total) by mouth daily as needed.  . fluticasone (FLONASE) 50 MCG/ACT nasal spray Place 2 sprays into both nostrils daily.  . furosemide (LASIX) 20 MG tablet Take 1 tablet (20 mg total) by mouth daily.  . hydrOXYzine (ATARAX/VISTARIL) 10 MG tablet Take 1-2 tablets (10-20 mg total) by mouth 2 (two) times daily as needed. For severe anxiety  . lisinopril (ZESTRIL) 20 MG tablet Take 1.5 tablets (30 mg total) by mouth daily.  Marland Kitchen tretinoin (RETIN-A) 0.01 % gel Apply topically at bedtime.  . [DISCONTINUED] albuterol (VENTOLIN HFA) 108 (90 Base) MCG/ACT inhaler Inhale 2 puffs into the lungs every 6 (six) hours as needed for wheezing or shortness of breath.  . [DISCONTINUED] amLODipine (NORVASC) 10 MG tablet Take 1 tablet (10 mg total) by mouth daily.  . [DISCONTINUED] furosemide (LASIX) 20 MG tablet Take 1 tablet (20 mg total) by mouth daily.  . [DISCONTINUED] lisinopril (ZESTRIL) 20 MG tablet Take 1.5 tablets (30 mg total) by mouth daily.  . phentermine (ADIPEX-P) 37.5 MG tablet Take 1 tablet (37.5 mg total) by mouth daily before breakfast.   No facility-administered encounter medications on file as of 05/11/2019.    Surgical History: Past Surgical History:  Procedure Laterality Date  . ABDOMINAL HYSTERECTOMY    . CARPAL TUNNEL RELEASE Bilateral   . COLONOSCOPY    . COLONOSCOPY WITH PROPOFOL N/A 02/23/2019   Procedure: COLONOSCOPY  WITH PROPOFOL;  Surgeon: Jonathon Bellows, MD;  Location: [facility] ENDOSCOPY;  Service: Gastroenterology;  Laterality: N/A;    Medical History: Past Medical History:  Diagnosis Date  . Asthma   . Hypertension     Family History: Family History  Problem Relation Age of Onset  . Breast cancer Mother 62  . Breast cancer Cousin   . Depression Sister   . Anxiety disorder Sister     Social History   Socioeconomic History  .  Marital status: Married    Spouse name: Lanny Hurst  . Number of children: 2  . Years of education: Not on file  . Highest education level: Associate degree: occupational, Hotel manager, or vocational program  Occupational History  . Not on file  Tobacco Use  . Smoking status: Never Smoker  . Smokeless tobacco: Never Used  Substance and Sexual Activity  . Alcohol use: No  . Drug use: No  . Sexual activity: Yes  Other Topics Concern  . Not on file  Social History Narrative  . Not on file   Social Determinants of Health   Financial Resource Strain: Low Risk   . Difficulty of Paying Living Expenses: Not hard at all  Food Insecurity: No Food Insecurity  . Worried About Charity fundraiser in the Last Year: Never true  . Ran Out of Food in the Last Year: Never true  Transportation Needs: No Transportation Needs  . Lack of Transportation (Medical): No  . Lack of Transportation (Non-Medical): No  Physical Activity: Inactive  . Days of Exercise per Week: 0 days  . Minutes of Exercise per Session: 0 min  Stress: No Stress Concern Present  . Feeling of Stress : Not at all  Social Connections: Unknown  . Frequency of Communication with Friends and Family: Not on file  . Frequency of Social Gatherings with Friends and Family: Not on file  . Attends Religious Services: More than 4 times per year  . Active Member of Clubs or Organizations: No  . Attends Archivist Meetings: Never  . Marital Status: Married  Human resources officer Violence: Not At Risk  . Fear of Current or Ex-Partner: No  . Emotionally Abused: No  . Physically Abused: No  . Sexually Abused: No      Review of Systems  Constitutional: Negative for chills, fatigue and unexpected weight change.       Weight loss of one pound since her last visit .  HENT: Negative for congestion, rhinorrhea, sneezing and sore throat.   Respiratory: Negative for cough, chest tightness, shortness of breath and wheezing.   Cardiovascular:  Negative for chest pain and palpitations.       Better control of blood pressure.  Gastrointestinal: Negative for abdominal pain, constipation, diarrhea, nausea and vomiting.  Musculoskeletal: Positive for arthralgias. Negative for back pain, joint swelling and neck pain.       Bilateral knees.   Skin: Negative for rash.  Allergic/Immunologic: Negative for environmental allergies.  Neurological: Negative for dizziness, tremors, numbness and headaches.  Hematological: Negative for adenopathy. Does not bruise/bleed easily.  Psychiatric/Behavioral: Negative for behavioral problems and sleep disturbance. The patient is not nervous/anxious.     Today's Vitals   05/11/19 1109  Weight: (!) 305 lb (138.3 kg)  Height: 5\' 5"  (1.651 m)   Body mass index is 50.75 kg/m.  Observation/Objective:   The patient is alert and oriented. She is pleasant and answers all questions appropriately. Breathing is non-labored. She is in no acute distress at  this time.    Assessment/Plan: 1. Hypertension, unspecified type Much improved. Continue amlodipine and lisinopril as prescribed.  - amLODipine (NORVASC) 10 MG tablet; Take 1 tablet (10 mg total) by mouth daily.  Dispense: 90 tablet; Refill: 1 - lisinopril (ZESTRIL) 20 MG tablet; Take 1.5 tablets (30 mg total) by mouth daily.  Dispense: 135 tablet; Refill: 1  2. Lower extremity edema May take furosemide 20mg  daily as needed.  - furosemide (LASIX) 20 MG tablet; Take 1 tablet (20 mg total) by mouth daily.  Dispense: 90 tablet; Refill: 1  3. Mild intermittent asthma without complication Use rescue inhaler as needed and as prescribed  - albuterol (VENTOLIN HFA) 108 (90 Base) MCG/ACT inhaler; Inhale 2 puffs into the lungs every 6 (six) hours as needed for wheezing or shortness of breath.  Dispense: 18 g; Refill: 3  4. BMI 50.0-59.9, adult (HCC) Restart phentermine. Limit calorie intake to 1500 calories per day. Gradually incorporate low-impact exercise  into daily routine.  - phentermine (ADIPEX-P) 37.5 MG tablet; Take 1 tablet (37.5 mg total) by mouth daily before breakfast.  Dispense: 30 tablet; Refill: 0  General Counseling: Amandah verbalizes understanding of the findings of today's phone visit and agrees with plan of treatment. I have discussed any further diagnostic evaluation that may be needed or ordered today. We also reviewed her medications today. she has been encouraged to call the office with any questions or concerns that should arise related to todays visit.    There is a liability release in patients' chart. There has been a 10 minute discussion about the side effects including but not limited to elevated blood pressure, anxiety, lack of sleep and dry mouth. Pt understands and will like to start/continue on appetite suppressant at this time. There will be one month RX given at the time of visit with proper follow up. Nova diet plan with restricted calories is given to the pt. Pt understands and agrees with  plan of treatment  This patient was seen by Leretha Pol FNP Collaboration with Dr Lavera Guise as a part of collaborative care agreement  Meds ordered this encounter  Medications  . amLODipine (NORVASC) 10 MG tablet    Sig: Take 1 tablet (10 mg total) by mouth daily.    Dispense:  90 tablet    Refill:  1    Order Specific Question:   Supervising Provider    Answer:   Lavera Guise [id number]  . lisinopril (ZESTRIL) 20 MG tablet    Sig: Take 1.5 tablets (30 mg total) by mouth daily.    Dispense:  135 tablet    Refill:  1    Please fill now as patient accidentally threw away her previous prescription. Thanks.    Order Specific Question:   Supervising Provider    Answer:   Lavera Guise [id number]  . furosemide (LASIX) 20 MG tablet    Sig: Take 1 tablet (20 mg total) by mouth daily.    Dispense:  90 tablet    Refill:  1    Please fill now as patient accidentally threw away her previous prescription. Thanks.    Order Specific  Question:   Supervising Provider    Answer:   Lavera Guise [id number]  . phentermine (ADIPEX-P) 37.5 MG tablet    Sig: Take 1 tablet (37.5 mg total) by mouth daily before breakfast.    Dispense:  30 tablet    Refill:  0    Order Specific Question:   Supervising  Provider    Answer:   Lavera Guise [id number]  . albuterol (VENTOLIN HFA) 108 (90 Base) MCG/ACT inhaler    Sig: Inhale 2 puffs into the lungs every 6 (six) hours as needed for wheezing or shortness of breath.    Dispense:  18 g    Refill:  3    Order Specific Question:   Supervising Provider    Answer:   Lavera Guise [id number]    Time spent: 28 Minutes    Dr Lavera Guise Internal medicine

## 2019-06-09 ENCOUNTER — Telehealth: Payer: Self-pay

## 2019-06-09 NOTE — Telephone Encounter (Signed)
Confirmed and screened for 06-13-19 ov.

## 2019-06-13 ENCOUNTER — Other Ambulatory Visit: Payer: Self-pay | Admitting: Nurse Practitioner

## 2019-06-13 ENCOUNTER — Other Ambulatory Visit: Payer: Self-pay

## 2019-06-13 ENCOUNTER — Ambulatory Visit: Payer: Managed Care, Other (non HMO) | Admitting: Nurse Practitioner

## 2019-06-13 ENCOUNTER — Encounter: Payer: Self-pay | Admitting: Nurse Practitioner

## 2019-06-13 ENCOUNTER — Ambulatory Visit
Admission: RE | Admit: 2019-06-13 | Discharge: 2019-06-13 | Disposition: A | Discharge status: Home / Self Care | Payer: Managed Care, Other (non HMO) | Source: Ambulatory Visit | Attending: Nurse Practitioner | Admitting: Nurse Practitioner

## 2019-06-13 VITALS — BP 155/84 | HR 86 | Temp 97.3°F | Resp 16 | Ht 65.0 in | Wt 309.8 lb

## 2019-06-13 DIAGNOSIS — Z1231 Encounter for screening mammogram for malignant neoplasm of breast: Secondary | ICD-10-CM | POA: Diagnosis present

## 2019-06-13 DIAGNOSIS — F321 Major depressive disorder, single episode, moderate: Secondary | ICD-10-CM | POA: Diagnosis not present

## 2019-06-13 DIAGNOSIS — I10 Essential (primary) hypertension: Secondary | ICD-10-CM | POA: Diagnosis not present

## 2019-06-13 DIAGNOSIS — Z6841 Body Mass Index (BMI) 40.0 and over, adult: Secondary | ICD-10-CM

## 2019-06-13 MED ORDER — LISINOPRIL 20 MG PO TABS: 40.0000 mg | ORAL_TABLET | Freq: Every day | ORAL | 1 refills | Status: DC

## 2019-06-13 MED ORDER — BUPROPION HCL 100 MG PO TABS: 100.0000 mg | ORAL_TABLET | Freq: Every day | ORAL | 2 refills | Status: DC

## 2019-06-13 NOTE — Progress Notes (Signed)
[facility] [place] [address], [place] [address]  Internal MEDICINE  Office Visit Note  Patient Name: April George  [id number]  [id number]  Date of Service: 06/13/2019  Chief Complaint  Patient presents with  . Follow-up    weight management   . Hypertension  . Depression    The patient is here for routine follow up. She was initially scheduled for follow up of weight management. She states that she has not been taking the appetite suppressant. She states that she feels like her blood pressure has been too high to start on this. Is generally running in the [id number] diastolic.  She is very sad and tearful at this visit. Feeling very overwhelmed with life stressors at this time. She states that she feels like there are a lot of things that demand her care and responsibility. States that she always has a plan for what she is going to do when she gets home from work, but when she gets there, she feels overcome with burden and responsibility and does not end up doing anything at all. She states that she is frequently fatigued and some days, struggles to get out of bed. She denies feeling suicidal. Does not want to hurt herself of anyone else.        Current Medication: Outpatient Encounter Medications as of 06/13/2019  Medication Sig  . albuterol (VENTOLIN HFA) 108 (90 Base) MCG/ACT inhaler Inhale 2 puffs into the lungs every 6 (six) hours as needed for wheezing or shortness of breath.  Marland Kitchen amLODipine (NORVASC) 10 MG tablet Take 1 tablet (10 mg total) by mouth daily.  . celecoxib (CELEBREX) 50 MG capsule Take 1 capsule (50 mg total) by mouth 2 (two) times daily.  Marland Kitchen desloratadine (CLARINEX REDITAB) 5 MG disintegrating tablet Take 1 tablet (5 mg total) by mouth daily as needed.  . fluticasone (FLONASE) 50 MCG/ACT nasal spray Place 2 sprays into both nostrils daily.  . furosemide (LASIX) 20 MG tablet Take 1 tablet (20 mg total) by mouth daily.  . hydrOXYzine (ATARAX/VISTARIL) 10 MG  tablet Take 1-2 tablets (10-20 mg total) by mouth 2 (two) times daily as needed. For severe anxiety  . lisinopril (ZESTRIL) 20 MG tablet Take 2 tablets (40 mg total) by mouth daily.  . phentermine (ADIPEX-P) 37.5 MG tablet Take 1 tablet (37.5 mg total) by mouth daily before breakfast.  . tretinoin (RETIN-A) 0.01 % gel Apply topically at bedtime.  . [DISCONTINUED] lisinopril (ZESTRIL) 20 MG tablet Take 1.5 tablets (30 mg total) by mouth daily.  Marland Kitchen buPROPion (WELLBUTRIN) 100 MG tablet Take 1 tablet (100 mg total) by mouth daily.   No facility-administered encounter medications on file as of 06/13/2019.    Surgical History: Past Surgical History:  Procedure Laterality Date  . ABDOMINAL HYSTERECTOMY    . CARPAL TUNNEL RELEASE Bilateral   . COLONOSCOPY    . COLONOSCOPY WITH PROPOFOL N/A 02/23/2019   Procedure: COLONOSCOPY WITH PROPOFOL;  Surgeon: Jonathon Bellows, MD;  Location: [facility] ENDOSCOPY;  Service: Gastroenterology;  Laterality: N/A;    Medical History: Past Medical History:  Diagnosis Date  . Asthma   . Hypertension     Family History: Family History  Problem Relation Age of Onset  . Breast cancer Mother 81  . Breast cancer Cousin   . Depression Sister   . Anxiety disorder Sister     Social History   Socioeconomic History  . Marital status: Married    Spouse name: Lanny Hurst  . Number of  children: 2  . Years of education: Not on file  . Highest education level: Associate degree: occupational, Hotel manager, or vocational program  Occupational History  . Not on file  Tobacco Use  . Smoking status: Never Smoker  . Smokeless tobacco: Never Used  Substance and Sexual Activity  . Alcohol use: No  . Drug use: No  . Sexual activity: Yes  Other Topics Concern  . Not on file  Social History Narrative  . Not on file   Social Determinants of Health   Financial Resource Strain: Low Risk   . Difficulty of Paying Living Expenses: Not hard at all  Food Insecurity: No Food Insecurity   . Worried About Charity fundraiser in the Last Year: Never true  . Ran Out of Food in the Last Year: Never true  Transportation Needs: No Transportation Needs  . Lack of Transportation (Medical): No  . Lack of Transportation (Non-Medical): No  Physical Activity: Inactive  . Days of Exercise per Week: 0 days  . Minutes of Exercise per Session: 0 min  Stress: No Stress Concern Present  . Feeling of Stress : Not at all  Social Connections: Unknown  . Frequency of Communication with Friends and Family: Not on file  . Frequency of Social Gatherings with Friends and Family: Not on file  . Attends Religious Services: More than 4 times per year  . Active Member of Clubs or Organizations: No  . Attends Archivist Meetings: Never  . Marital Status: Married  Human resources officer Violence: Not At Risk  . Fear of Current or Ex-Partner: No  . Emotionally Abused: No  . Physically Abused: No  . Sexually Abused: No      Review of Systems  Constitutional: Negative for chills, fatigue and unexpected weight change.  HENT: Negative for congestion, rhinorrhea, sneezing and sore throat.   Respiratory: Negative for cough, chest tightness, shortness of breath and wheezing.   Cardiovascular: Negative for chest pain and palpitations.       Blood pressure elevated today  Gastrointestinal: Negative for abdominal pain, constipation, diarrhea, nausea and vomiting.  Endocrine: Negative for cold intolerance, heat intolerance, polydipsia and polyuria.  Musculoskeletal: Positive for arthralgias. Negative for back pain, joint swelling and neck pain.       Bilateral knees.   Skin: Negative for rash.  Allergic/Immunologic: Negative for environmental allergies.  Neurological: Negative for dizziness, tremors, numbness and headaches.  Hematological: Negative for adenopathy. Does not bruise/bleed easily.  Psychiatric/Behavioral: Positive for dysphoric mood. Negative for behavioral problems and sleep  disturbance. The patient is nervous/anxious.     Today's Vitals   06/13/19 1420  BP: (!) 155/84  Pulse: 86  Resp: 16  Temp: (!) 97.3 F (36.3 C)  SpO2: 95%  Weight: (!) 309 lb 12.8 oz (140.5 kg)  Height: 5\' 5"  (1.651 m)   Body mass index is 51.55 kg/m.  Physical Exam Vitals and nursing note reviewed.  Constitutional:      General: She is not in acute distress.    Appearance: Normal appearance. She is well-developed. She is obese. She is not diaphoretic.  HENT:     Head: Normocephalic and atraumatic.     Mouth/Throat:     Pharynx: No oropharyngeal exudate.  Eyes:     Pupils: Pupils are equal, round, and reactive to light.  Neck:     Thyroid: No thyromegaly.     Vascular: No carotid bruit or JVD.     Trachea: No tracheal deviation.  Cardiovascular:  Rate and Rhythm: Normal rate and regular rhythm.     Heart sounds: Normal heart sounds. No murmur. No friction rub. No gallop.   Pulmonary:     Effort: Pulmonary effort is normal. No respiratory distress.     Breath sounds: Normal breath sounds. No wheezing or rales.  Chest:     Chest wall: No tenderness.  Abdominal:     Palpations: Abdomen is soft.  Musculoskeletal:        General: Normal range of motion.     Cervical back: Normal range of motion and neck supple.     Comments: She has bilateral knee pain with swelling. ROM and strength are mildly reduced due to pain.   Lymphadenopathy:     Cervical: No cervical adenopathy.  Skin:    General: Skin is warm and dry.  Neurological:     Mental Status: She is alert and oriented to person, place, and time.     Cranial Nerves: No cranial nerve deficit.  Psychiatric:        Attention and Perception: Attention and perception normal.        Mood and Affect: Mood is anxious and depressed. Affect is tearful.        Speech: Speech normal.        Behavior: Behavior normal. Behavior is cooperative.        Thought Content: Thought content normal.        Cognition and Memory:  Cognition and memory normal.        Judgment: Judgment normal.    Assessment/Plan: 1. Hypertension, unspecified type Increase lisinopril to 40mg  daily. continue amlodipine to 10mg  daily and furosemide daily.  - lisinopril (ZESTRIL) 20 MG tablet; Take 2 tablets (40 mg total) by mouth daily.  Dispense: 180 tablet; Refill: 1  2. Depression, major, single episode, moderate (HCC) Start bupropion 100mg  daily. Consider seeing counselor at employee assistance program for further evaluation and treatment.  - buPROPion (WELLBUTRIN) 100 MG tablet; Take 1 tablet (100 mg total) by mouth daily.  Dispense: 30 tablet; Refill: 2  3. Class 3 severe obesity due to excess calories with serious comorbidity and body mass index (BMI) of 45.0 to 49.9 in adult [facility]) Hold phentermine for now. Encourage her to limit calorie intake to 1500 calories or less and gradually incorporate exercise into daily routine.   General Counseling: donata krekeler understanding of the findings of todays visit and agrees with plan of treatment. I have discussed any further diagnostic evaluation that may be needed or ordered today. We also reviewed her medications today. she has been encouraged to call the office with any questions or concerns that should arise related to todays visit.  Hypertension Counseling:   The following hypertensive lifestyle modification were recommended and discussed:  1. Limiting alcohol intake to less than 1 oz/day of ethanol:(24 oz of beer or 8 oz of wine or 2 oz of 100-proof whiskey). 2. Take baby ASA 81 mg daily. 3. Importance of regular aerobic exercise and losing weight. 4. Reduce dietary saturated fat and cholesterol intake for overall cardiovascular health. 5. Maintaining adequate dietary potassium, calcium, and magnesium intake. 6. Regular monitoring of the blood pressure. 7. Reduce sodium intake to less than 100 mmol/day (less than 2.3 gm of sodium or less than 6 gm of sodium choride)   This  patient was seen by [place] with Dr Lavera Guise as a part of collaborative care agreement  Meds ordered this encounter  Medications  . lisinopril (  ZESTRIL) 20 MG tablet    Sig: Take 2 tablets (40 mg total) by mouth daily.    Dispense:  180 tablet    Refill:  1    Increased dose to 40mg  daily.    Order Specific Question:   Supervising Provider    Answer:   Lavera Guise [id number]  . buPROPion (WELLBUTRIN) 100 MG tablet    Sig: Take 1 tablet (100 mg total) by mouth daily.    Dispense:  30 tablet    Refill:  2    Order Specific Question:   Supervising Provider    Answer:   Lavera Guise [id number]    Total time spent: 30 Minutes   Time spent includes review of chart, medications, test results, and follow up plan with the patient.      Dr Lavera Guise Internal medicine

## 2019-06-15 NOTE — Progress Notes (Signed)
Negative mammogram

## 2019-07-07 ENCOUNTER — Telehealth: Payer: Self-pay

## 2019-07-07 NOTE — Telephone Encounter (Signed)
Lmom to confirm and screen for 07-11-19 ov. 

## 2019-07-11 ENCOUNTER — Other Ambulatory Visit: Payer: Self-pay

## 2019-07-11 ENCOUNTER — Ambulatory Visit: Payer: Managed Care, Other (non HMO) | Admitting: Nurse Practitioner

## 2019-07-11 ENCOUNTER — Encounter: Payer: Self-pay | Admitting: Nurse Practitioner

## 2019-07-11 VITALS — BP 142/80 | HR 103 | Temp 97.1°F | Resp 16 | Ht 65.0 in | Wt 313.0 lb

## 2019-07-11 DIAGNOSIS — I10 Essential (primary) hypertension: Secondary | ICD-10-CM | POA: Diagnosis not present

## 2019-07-11 DIAGNOSIS — M170 Bilateral primary osteoarthritis of knee: Secondary | ICD-10-CM

## 2019-07-11 DIAGNOSIS — F321 Major depressive disorder, single episode, moderate: Secondary | ICD-10-CM

## 2019-07-11 NOTE — Progress Notes (Signed)
[facility] [place], [place] [address]  Internal MEDICINE  Office Visit Note  Patient Name: April George  [id number]  [id number]  Date of Service: 07/22/2019  Chief Complaint  Patient presents with  . Hypertension    increased lisinopril  . Depression    added wellbutrin daily    The patient is here for follow up of blood pressure. Increased lisinopril to 40mg  daily. She continues to take amlodipine 10mg  and furosemide 20mg  daily. Blood pressure is up some at initial check. She was rushing to get here and felt very stressed. Now more relaxed. Has had a four pound weight gain since she was last seen. Has not been taking phentermine. This was held at last visit due to high blood pressure. She monitors her blood pressure at home. Runs between [id number] and [id number] systolic.  Started her on wellbutrin at her last visit. She had been very sad and tearful at her last visit. was very overwhelmed with life stressors at this time. She states that with the wellbutrin, she is able to get all of her tasks done, feels motivated, and not overwhelmed. She is no longer tearful. No longer has that heaviness feeling in her chest.       Current Medication: Outpatient Encounter Medications as of 07/11/2019  Medication Sig  . albuterol (VENTOLIN HFA) 108 (90 Base) MCG/ACT inhaler Inhale 2 puffs into the lungs every 6 (six) hours as needed for wheezing or shortness of breath.  Marland Kitchen amLODipine (NORVASC) 10 MG tablet Take 1 tablet (10 mg total) by mouth daily.  Marland Kitchen buPROPion (WELLBUTRIN) 100 MG tablet Take 1 tablet (100 mg total) by mouth daily.  . celecoxib (CELEBREX) 50 MG capsule Take 1 capsule (50 mg total) by mouth 2 (two) times daily.  Marland Kitchen desloratadine (CLARINEX REDITAB) 5 MG disintegrating tablet Take 1 tablet (5 mg total) by mouth daily as needed.  . fluticasone (FLONASE) 50 MCG/ACT nasal spray Place 2 sprays into both nostrils daily.  . furosemide (LASIX) 20 MG tablet Take 1 tablet (20 mg  total) by mouth daily.  . hydrOXYzine (ATARAX/VISTARIL) 10 MG tablet Take 1-2 tablets (10-20 mg total) by mouth 2 (two) times daily as needed. For severe anxiety  . lisinopril (ZESTRIL) 20 MG tablet Take 2 tablets (40 mg total) by mouth daily.  . phentermine (ADIPEX-P) 37.5 MG tablet Take 1 tablet (37.5 mg total) by mouth daily before breakfast.  . tretinoin (RETIN-A) 0.01 % gel Apply topically at bedtime.   No facility-administered encounter medications on file as of 07/11/2019.    Surgical History: Past Surgical History:  Procedure Laterality Date  . ABDOMINAL HYSTERECTOMY    . CARPAL TUNNEL RELEASE Bilateral   . COLONOSCOPY    . COLONOSCOPY WITH PROPOFOL N/A 02/23/2019   Procedure: COLONOSCOPY WITH PROPOFOL;  Surgeon: Jonathon Bellows, MD;  Location: [facility] ENDOSCOPY;  Service: Gastroenterology;  Laterality: N/A;    Medical History: Past Medical History:  Diagnosis Date  . Asthma   . Hypertension     Family History: Family History  Problem Relation Age of Onset  . Breast cancer Mother 47  . Breast cancer Cousin   . Depression Sister   . Anxiety disorder Sister     Social History   Socioeconomic History  . Marital status: Married    Spouse name: Lanny Hurst  . Number of children: 2  . Years of education: Not on file  . Highest education level: Associate degree: occupational, Hotel manager, or vocational program  Occupational History  .  Not on file  Tobacco Use  . Smoking status: Never Smoker  . Smokeless tobacco: Never Used  Substance and Sexual Activity  . Alcohol use: No  . Drug use: No  . Sexual activity: Yes  Other Topics Concern  . Not on file  Social History Narrative  . Not on file   Social Determinants of Health   Financial Resource Strain:   . Difficulty of Paying Living Expenses:   Food Insecurity:   . Worried About Charity fundraiser in the Last Year:   . Arboriculturist in the Last Year:   Transportation Needs:   . Film/video editor (Medical):   Marland Kitchen  Lack of Transportation (Non-Medical):   Physical Activity:   . Days of Exercise per Week:   . Minutes of Exercise per Session:   Stress:   . Feeling of Stress :   Social Connections:   . Frequency of Communication with Friends and Family:   . Frequency of Social Gatherings with Friends and Family:   . Attends Religious Services:   . Active Member of Clubs or Organizations:   . Attends Archivist Meetings:   Marland Kitchen Marital Status:   Intimate Partner Violence:   . Fear of Current or Ex-Partner:   . Emotionally Abused:   Marland Kitchen Physically Abused:   . Sexually Abused:       Review of Systems  Constitutional: Negative for activity change, chills, fatigue and unexpected weight change.  HENT: Negative for congestion, rhinorrhea, sneezing and sore throat.   Respiratory: Negative for cough, chest tightness, shortness of breath and wheezing.   Cardiovascular: Negative for chest pain and palpitations.       Blood pressure improved but still elevated.   Gastrointestinal: Negative for abdominal pain, constipation, diarrhea, nausea and vomiting.  Endocrine: Negative for cold intolerance, heat intolerance, polydipsia and polyuria.  Musculoskeletal: Positive for arthralgias. Negative for back pain, joint swelling and neck pain.       Bilateral knees.   Skin: Negative for rash.  Allergic/Immunologic: Negative for environmental allergies.  Neurological: Negative for dizziness, tremors, numbness and headaches.  Hematological: Negative for adenopathy. Does not bruise/bleed easily.  Psychiatric/Behavioral: Positive for dysphoric mood. Negative for behavioral problems and sleep disturbance. The patient is nervous/anxious.     Today's Vitals   07/11/19 1419  BP: (!) 142/80  Pulse: (!) 103  Resp: 16  Temp: (!) 97.1 F (36.2 C)  SpO2: 97%  Weight: (!) 313 lb (142 kg)  Height: 5\' 5"  (1.651 m)   Body mass index is 52.09 kg/m.  Physical Exam Vitals and nursing note reviewed.   Constitutional:      General: She is not in acute distress.    Appearance: Normal appearance. She is well-developed. She is obese. She is not diaphoretic.  HENT:     Head: Normocephalic and atraumatic.     Mouth/Throat:     Pharynx: No oropharyngeal exudate.  Eyes:     Pupils: Pupils are equal, round, and reactive to light.  Neck:     Thyroid: No thyromegaly.     Vascular: No carotid bruit or JVD.     Trachea: No tracheal deviation.  Cardiovascular:     Rate and Rhythm: Normal rate and regular rhythm.     Heart sounds: Normal heart sounds. No murmur. No friction rub. No gallop.   Pulmonary:     Effort: Pulmonary effort is normal. No respiratory distress.     Breath sounds: Normal breath sounds.  No wheezing or rales.  Chest:     Chest wall: No tenderness.  Abdominal:     Palpations: Abdomen is soft.  Musculoskeletal:        General: Normal range of motion.     Cervical back: Normal range of motion and neck supple.     Comments: She has bilateral knee pain with swelling. ROM and strength are mildly reduced due to pain.   Lymphadenopathy:     Cervical: No cervical adenopathy.  Skin:    General: Skin is warm and dry.  Neurological:     Mental Status: She is alert and oriented to person, place, and time.     Cranial Nerves: No cranial nerve deficit.  Psychiatric:        Attention and Perception: Attention and perception normal.        Mood and Affect: Mood is anxious and depressed. Affect is tearful.        Speech: Speech normal.        Behavior: Behavior normal. Behavior is cooperative.        Thought Content: Thought content normal.        Cognition and Memory: Cognition and memory normal.        Judgment: Judgment normal.     Comments: Mood improved since her most recent visit.     Assessment/Plan: 1. Hypertension, unspecified type Improved and stable. Continue bp medication as prescribed.   2. Depression, major, single episode, moderate (HCC) Improved. Continue  wellbutrin as prescribed   3. Osteoarthritis of both knees, unspecified osteoarthritis type Apply a compressive ACE bandage. Rest and elevate the affected painful area.  Apply cold compresses intermittently as needed.  As pain recedes, begin normal activities slowly as tolerated.  Refer to orthopedics as indicated.   General Counseling: kynsley tarvin understanding of the findings of todays visit and agrees with plan of treatment. I have discussed any further diagnostic evaluation that may be needed or ordered today. We also reviewed her medications today. she has been encouraged to call the office with any questions or concerns that should arise related to todays visit.   Hypertension Counseling:   The following hypertensive lifestyle modification were recommended and discussed:  1. Limiting alcohol intake to less than 1 oz/day of ethanol:(24 oz of beer or 8 oz of wine or 2 oz of 100-proof whiskey). 2. Take baby ASA 81 mg daily. 3. Importance of regular aerobic exercise and losing weight. 4. Reduce dietary saturated fat and cholesterol intake for overall cardiovascular health. 5. Maintaining adequate dietary potassium, calcium, and magnesium intake. 6. Regular monitoring of the blood pressure. 7. Reduce sodium intake to less than 100 mmol/day (less than 2.3 gm of sodium or less than 6 gm of sodium choride)   This patient was seen by [place] with Dr Lavera Guise as a part of collaborative care agreement  Total time spent: 25 Minutes   Time spent includes review of chart, medications, test results, and follow up plan with the patient.      Dr Lavera Guise Internal medicine

## 2019-08-09 ENCOUNTER — Telehealth: Payer: Self-pay

## 2019-08-09 NOTE — Telephone Encounter (Signed)
Lmom to confirm and screen for 08-11-19 ov.

## 2019-08-11 ENCOUNTER — Ambulatory Visit: Payer: Managed Care, Other (non HMO) | Admitting: Nurse Practitioner

## 2019-12-08 ENCOUNTER — Telehealth: Payer: Self-pay

## 2019-12-08 NOTE — Telephone Encounter (Signed)
lmom to reschedule last appt  And is due for a CPE in November. April George

## 2020-02-02 ENCOUNTER — Other Ambulatory Visit: Payer: Self-pay

## 2020-02-02 MED ORDER — LEVOFLOXACIN 500 MG PO TABS: ORAL_TABLET | ORAL | 0 refills | Status: DC

## 2020-02-02 NOTE — Progress Notes (Signed)
  Pt called C/O having a head cold on Monday and was taking OTC thera flu and seemed to get better and on wed she noticed chest tightness, cough productive with yellow mucus, body aches no fever.  Also used her inhaler.  Pt has had both covid vaccines.  Spoke to Dr Humphrey Rolls and she advised to call in levaquin 500 mg take once a day for 7 days and to use mucinex.  If not any better to call us on Monday.  Called pt and advised all info to pt.

## 2020-02-08 ENCOUNTER — Encounter: Payer: Self-pay | Admitting: Nurse Practitioner

## 2020-02-08 ENCOUNTER — Ambulatory Visit (INDEPENDENT_AMBULATORY_CARE_PROVIDER_SITE_OTHER): Payer: Managed Care, Other (non HMO) | Admitting: Nurse Practitioner

## 2020-02-08 ENCOUNTER — Other Ambulatory Visit: Payer: Self-pay

## 2020-02-08 VITALS — BP 135/76 | HR 99 | Temp 97.6°F | Resp 16 | Ht 65.0 in | Wt 307.0 lb

## 2020-02-08 DIAGNOSIS — J4520 Mild intermittent asthma, uncomplicated: Secondary | ICD-10-CM

## 2020-02-08 DIAGNOSIS — R300 Dysuria: Secondary | ICD-10-CM

## 2020-02-08 DIAGNOSIS — Z0001 Encounter for general adult medical examination with abnormal findings: Secondary | ICD-10-CM | POA: Diagnosis not present

## 2020-02-08 DIAGNOSIS — R600 Localized edema: Secondary | ICD-10-CM

## 2020-02-08 DIAGNOSIS — M1990 Unspecified osteoarthritis, unspecified site: Secondary | ICD-10-CM

## 2020-02-08 DIAGNOSIS — Z23 Encounter for immunization: Secondary | ICD-10-CM

## 2020-02-08 DIAGNOSIS — L700 Acne vulgaris: Secondary | ICD-10-CM

## 2020-02-08 DIAGNOSIS — F321 Major depressive disorder, single episode, moderate: Secondary | ICD-10-CM

## 2020-02-08 DIAGNOSIS — I10 Essential (primary) hypertension: Secondary | ICD-10-CM | POA: Diagnosis not present

## 2020-02-08 DIAGNOSIS — J309 Allergic rhinitis, unspecified: Secondary | ICD-10-CM

## 2020-02-08 MED ORDER — FUROSEMIDE 20 MG PO TABS: 20.0000 mg | ORAL_TABLET | Freq: Every day | ORAL | 1 refills | Status: DC

## 2020-02-08 MED ORDER — TRETINOIN 0.025 % EX CREA: TOPICAL_CREAM | Freq: Every day | CUTANEOUS | 0 refills | Status: DC

## 2020-02-08 MED ORDER — CELECOXIB 50 MG PO CAPS: 50.0000 mg | ORAL_CAPSULE | Freq: Two times a day (BID) | ORAL | 2 refills | Status: DC

## 2020-02-08 MED ORDER — AMLODIPINE BESYLATE 10 MG PO TABS: 10.0000 mg | ORAL_TABLET | Freq: Every day | ORAL | 1 refills | Status: DC

## 2020-02-08 MED ORDER — LISINOPRIL 20 MG PO TABS: 40.0000 mg | ORAL_TABLET | Freq: Every day | ORAL | 1 refills | Status: DC

## 2020-02-08 MED ORDER — FLUTICASONE PROPIONATE 50 MCG/ACT NA SUSP: 2.0000 | Freq: Every day | NASAL | 3 refills | Status: DC

## 2020-02-08 MED ORDER — BUPROPION HCL 100 MG PO TABS: 100.0000 mg | ORAL_TABLET | Freq: Every day | ORAL | 1 refills | Status: DC

## 2020-02-08 NOTE — Progress Notes (Signed)
[facility] [place], [place] [address]  Internal MEDICINE  Office Visit Note  Patient Name: April George  [id number]  [id number]  Date of Service: 03/06/2020  Chief Complaint  Patient presents with  . Annual Exam    prescrition given for skin wasnt approved need something else  . Hypertension  . Quality Metric Gaps    flu,covid,Hep C  . controlled substance form    reviewed with PT  . Oral Swelling    roof of mouth had blisters now they are open     The patient is here for health maintenance exam.  -more frequent wheezing. Having to use rescue inhaler more often. Happens mostly at night. Does have history of asthma but has not really "dealt" with it in recent years. States that she is using her rescue inhaler 3 to 4 evenings per week.  She is overdue to have routine, fasting labs.  She had mammogram 05/2019 and it was negative   Pt is here for routine health maintenance examination  Current Medication: Outpatient Encounter Medications as of 02/08/2020  Medication Sig  . albuterol (VENTOLIN HFA) 108 (90 Base) MCG/ACT inhaler Inhale 2 puffs into the lungs every 6 (six) hours as needed for wheezing or shortness of breath.  Marland Kitchen amLODipine (NORVASC) 10 MG tablet Take 1 tablet (10 mg total) by mouth daily.  Marland Kitchen buPROPion (WELLBUTRIN) 100 MG tablet Take 1 tablet (100 mg total) by mouth daily.  . celecoxib (CELEBREX) 50 MG capsule Take 1 capsule (50 mg total) by mouth 2 (two) times daily.  Marland Kitchen desloratadine (CLARINEX REDITAB) 5 MG disintegrating tablet Take 1 tablet (5 mg total) by mouth daily as needed.  . fluticasone (FLONASE) 50 MCG/ACT nasal spray Place 2 sprays into both nostrils daily.  . furosemide (LASIX) 20 MG tablet Take 1 tablet (20 mg total) by mouth daily.  . hydrOXYzine (ATARAX/VISTARIL) 10 MG tablet Take 1-2 tablets (10-20 mg total) by mouth 2 (two) times daily as needed. For severe anxiety  . lisinopril (ZESTRIL) 20 MG tablet Take 2 tablets (40  mg total) by mouth daily.  . phentermine (ADIPEX-P) 37.5 MG tablet Take 1 tablet (37.5 mg total) by mouth daily before breakfast.  . [DISCONTINUED] amLODipine (NORVASC) 10 MG tablet Take 1 tablet (10 mg total) by mouth daily.  . [DISCONTINUED] buPROPion (WELLBUTRIN) 100 MG tablet Take 1 tablet (100 mg total) by mouth daily.  . [DISCONTINUED] celecoxib (CELEBREX) 50 MG capsule Take 1 capsule (50 mg total) by mouth 2 (two) times daily.  . [DISCONTINUED] fluticasone (FLONASE) 50 MCG/ACT nasal spray Place 2 sprays into both nostrils daily.  . [DISCONTINUED] furosemide (LASIX) 20 MG tablet Take 1 tablet (20 mg total) by mouth daily.  . [DISCONTINUED] levofloxacin (LEVAQUIN) 500 MG tablet Take 1 tablet by mouth daily for 7 days  . [DISCONTINUED] lisinopril (ZESTRIL) 20 MG tablet Take 2 tablets (40 mg total) by mouth daily.  . [DISCONTINUED] tretinoin (RETIN-A) 0.01 % gel Apply topically at bedtime.  . tretinoin (RETIN-A) 0.025 % cream Apply topically at bedtime.   No facility-administered encounter medications on file as of 02/08/2020.    Surgical History: Past Surgical History:  Procedure Laterality Date  . ABDOMINAL HYSTERECTOMY    . CARPAL TUNNEL RELEASE Bilateral   . COLONOSCOPY    . COLONOSCOPY WITH PROPOFOL N/A 02/23/2019   Procedure: COLONOSCOPY WITH PROPOFOL;  Surgeon: Jonathon Bellows, MD;  Location: [facility] ENDOSCOPY;  Service: Gastroenterology;  Laterality: N/A;    Medical History: Past Medical  History:  Diagnosis Date  . Asthma   . Hypertension     Family History: Family History  Problem Relation Age of Onset  . Breast cancer Mother 71  . Breast cancer Cousin   . Depression Sister   . Anxiety disorder Sister       Review of Systems  Constitutional: Negative for activity change, chills, fatigue and unexpected weight change.  HENT: Negative for congestion, postnasal drip, rhinorrhea, sneezing and sore throat.   Respiratory: Positive for wheezing. Negative for cough, chest  tightness and shortness of breath.   Cardiovascular: Negative for chest pain and palpitations.  Gastrointestinal: Negative for abdominal pain, constipation, diarrhea, nausea and vomiting.  Endocrine: Negative for cold intolerance, heat intolerance, polydipsia and polyuria.  Genitourinary: Negative for dysuria, frequency and urgency.  Musculoskeletal: Negative for arthralgias, back pain, joint swelling and neck pain.  Skin: Negative for rash.  Allergic/Immunologic: Negative for environmental allergies.  Neurological: Negative for dizziness, tremors, numbness and headaches.  Hematological: Negative for adenopathy. Does not bruise/bleed easily.  Psychiatric/Behavioral: Positive for dysphoric mood. Negative for behavioral problems (Depression), sleep disturbance and suicidal ideas. The patient is nervous/anxious.      Today's Vitals   02/08/20 1501  BP: 135/76  Pulse: 99  Resp: 16  Temp: 97.6 F (36.4 C)  SpO2: 97%  Weight: (!) 307 lb (139.3 kg)  Height: 5\' 5"  (1.651 m)   Body mass index is 51.09 kg/m.  Physical Exam Vitals and nursing note reviewed.  Constitutional:      General: She is not in acute distress.    Appearance: Normal appearance. She is well-developed and well-nourished. She is obese. She is not diaphoretic.  HENT:     Head: Normocephalic and atraumatic.     Nose: Nose normal.     Mouth/Throat:     Mouth: Oropharynx is clear and moist.     Pharynx: No oropharyngeal exudate.  Eyes:     Extraocular Movements: EOM normal.     Pupils: Pupils are equal, round, and reactive to light.  Neck:     Thyroid: No thyromegaly.     Vascular: No carotid bruit or JVD.     Trachea: No tracheal deviation.  Cardiovascular:     Rate and Rhythm: Normal rate and regular rhythm.     Pulses: Normal pulses.     Heart sounds: Normal heart sounds. No murmur heard. No friction rub. No gallop.   Pulmonary:     Effort: Pulmonary effort is normal. No respiratory distress.     Breath  sounds: Normal breath sounds. No wheezing or rales.  Chest:     Chest wall: No tenderness.  Breasts:     Right: Normal. No swelling, bleeding, inverted nipple, mass, nipple discharge, skin change, tenderness or axillary adenopathy.     Left: Normal. No swelling, bleeding, inverted nipple, mass, nipple discharge, skin change, tenderness or axillary adenopathy.    Abdominal:     General: Bowel sounds are normal.     Palpations: Abdomen is soft.     Tenderness: There is no abdominal tenderness.  Musculoskeletal:        General: Normal range of motion.     Cervical back: Normal range of motion and neck supple.  Lymphadenopathy:     Cervical: No cervical adenopathy.     Upper Body:     Right upper body: No axillary adenopathy.     Left upper body: No axillary adenopathy.  Skin:    General: Skin is warm and dry.  Neurological:     General: No focal deficit present.     Mental Status: She is alert and oriented to person, place, and time.     Cranial Nerves: No cranial nerve deficit.  Psychiatric:        Mood and Affect: Mood and affect and mood normal.        Behavior: Behavior normal.        Thought Content: Thought content normal.        Judgment: Judgment normal.      LABS: Recent Results (from the past 2160 hour(s))  UA/M w/rflx Culture, Routine     Status: Abnormal   Collection Time: 02/08/20  3:01 PM   Specimen: Urine   Urine  Result Value Ref Range   Specific Gravity, UA      >=1.030 (A) 1.005 - 1.030   pH, UA 5.5 5.0 - 7.5   Color, UA Yellow Yellow   Appearance Ur Cloudy (A) Clear   Leukocytes,UA 2+ (A) Negative   Protein,UA Trace Negative/Trace   Glucose, UA Negative Negative   Ketones, UA Negative Negative   RBC, UA Negative Negative   Bilirubin, UA Negative Negative   Urobilinogen, Ur 0.2 0.2 - 1.0 mg/dL   Nitrite, UA Negative Negative   Microscopic Examination See below:     Comment: Microscopic was indicated and was performed.   Urinalysis Reflex Comment      Comment: This specimen has reflexed to a Urine Culture.  Microscopic Examination     Status: Abnormal   Collection Time: 02/08/20  3:01 PM   Urine  Result Value Ref Range   WBC, UA >30 (A) 0 - 5 /hpf   RBC 3-10 (A) 0 - 2 /hpf   Epithelial Cells (non renal) >10 (A) 0 - 10 /hpf   Casts None seen None seen /lpf   Bacteria, UA Moderate (A) None seen/Few   Yeast, UA Present (A) None seen  Urine Culture, Reflex     Status: None   Collection Time: 02/08/20  3:01 PM   Urine  Result Value Ref Range   Urine Culture, Routine Final report    Organism ID, Bacteria Comment     Comment: Mixed urogenital flora Greater than 100,000 colony forming units per mL     Assessment/Plan: 1. Encounter for general adult medical examination with abnormal findings Annual health maintenance exam today. Order slip given to have routine, fasting labs drawn.  2. Hypertension, unspecified type Stable. Continue amlodipine and lisinopril as previously prescribed. Refills provided today.  - amLODipine (NORVASC) 10 MG tablet; Take 1 tablet (10 mg total) by mouth daily.  Dispense: 90 tablet; Refill: 1 - lisinopril (ZESTRIL) 20 MG tablet; Take 2 tablets (40 mg total) by mouth daily.  Dispense: 180 tablet; Refill: 1  3. Mild intermittent asthma without complication Continue to use rescue inhaler as needed and as prescribed. Will get pulmonary function test for further evaluation.  - Pulmonary function test; Future  4. Chronic allergic rhinitis Use flonase nasal spray - two sprays in both nostrils daily.  - fluticasone (FLONASE) 50 MCG/ACT nasal spray; Place 2 sprays into both nostrils daily.  Dispense: 48 g; Refill: 3  5. Lower extremity edema Continue furosemide as previously prescribed.  - furosemide (LASIX) 20 MG tablet; Take 1 tablet (20 mg total) by mouth daily.  Dispense: 90 tablet; Refill: 1  6. Osteoarthritis, unspecified osteoarthritis type, unspecified site Take celebrex 50mg  twice daily when needed  for pain/inflammation.  - celecoxib (CELEBREX) 50 MG  capsule; Take 1 capsule (50 mg total) by mouth 2 (two) times daily.  Dispense: 60 capsule; Refill: 2  7. Depression, major, single episode, moderate ([place]) Doing well with bupropion 100mg  daily. Continue as prescribed  - buPROPion (WELLBUTRIN) 100 MG tablet; Take 1 tablet (100 mg total) by mouth daily.  Dispense: 90 tablet; Refill: 1  8. Acne vulgaris Trial tretinoin cream - apply to effected areas daily as needed.  - tretinoin (RETIN-A) 0.025 % cream; Apply topically at bedtime.  Dispense: 45 g; Refill: 0  9. Flu vaccine need Flu vaccine administered in the office today.  - Flu Vaccine MDCK QUAD PF  10. Dysuria - UA/M w/rflx Culture, Routine  General Counseling: April George verbalizes understanding of the findings of todays visit and agrees with plan of treatment. I have discussed any further diagnostic evaluation that may be needed or ordered today. We also reviewed her medications today. she has been encouraged to call the office with any questions or concerns that should arise related to todays visit.    Counseling:  This patient was seen by Leretha Pol FNP Collaboration with Dr Lavera Guise as a part of collaborative care agreement  Orders Placed This Encounter  Procedures  . Microscopic Examination  . Urine Culture, Reflex  . Flu Vaccine MDCK QUAD PF  . UA/M w/rflx Culture, Routine  . Pulmonary function test    Meds ordered this encounter  Medications  . tretinoin (RETIN-A) 0.025 % cream    Sig: Apply topically at bedtime.    Dispense:  45 g    Refill:  0    If insurance does not cover, please run through with GoodRx. Patient given GoodRx card today    Order Specific Question:   Supervising Provider    Answer:   Lavera Guise [[id number]]  . buPROPion (WELLBUTRIN) 100 MG tablet    Sig: Take 1 tablet (100 mg total) by mouth daily.    Dispense:  90 tablet    Refill:  1    Order Specific Question:   Supervising Provider     Answer:   Lavera Guise [[id number]]  . amLODipine (NORVASC) 10 MG tablet    Sig: Take 1 tablet (10 mg total) by mouth daily.    Dispense:  90 tablet    Refill:  1    Order Specific Question:   Supervising Provider    Answer:   Lavera Guise [[id number]]  . fluticasone (FLONASE) 50 MCG/ACT nasal spray    Sig: Place 2 sprays into both nostrils daily.    Dispense:  48 g    Refill:  3    Order Specific Question:   Supervising Provider    Answer:   Lavera Guise [[id number]]  . furosemide (LASIX) 20 MG tablet    Sig: Take 1 tablet (20 mg total) by mouth daily.    Dispense:  90 tablet    Refill:  1    Order Specific Question:   Supervising Provider    Answer:   Lavera Guise [[id number]]  . lisinopril (ZESTRIL) 20 MG tablet    Sig: Take 2 tablets (40 mg total) by mouth daily.    Dispense:  180 tablet    Refill:  1    Increased dose to 40mg  daily.    Order Specific Question:   Supervising Provider    Answer:   Lavera Guise [[id number]]  . celecoxib (CELEBREX) 50 MG capsule    Sig: Take 1 capsule (50 mg total)  by mouth 2 (two) times daily.    Dispense:  60 capsule    Refill:  2    Order Specific Question:   Supervising Provider    Answer:   Lavera Guise [[id number]]    Total time spent: 42 Minutes  Time spent includes review of chart, medications, test results, and follow up plan with the patient.     Lavera Guise, MD  Internal Medicine

## 2020-02-09 NOTE — Progress Notes (Signed)
Likely contaminated secimen. Wait for culture/sensitivity. Treat for yeast.

## 2020-02-11 LAB — UA/M W/RFLX CULTURE, ROUTINE
Bilirubin, UA: NEGATIVE
Glucose, UA: NEGATIVE
Ketones, UA: NEGATIVE
Nitrite, UA: NEGATIVE
RBC, UA: NEGATIVE
Specific Gravity, UA: >=1.03 — AB (ref 1.005–1.030)
Urobilinogen, Ur: 0.2 mg/dL (ref 0.2–1.0)
pH, UA: 5.5 (ref 5.0–7.5)

## 2020-02-11 LAB — URINE CULTURE, REFLEX

## 2020-02-11 LAB — MICROSCOPIC EXAMINATION
Casts: NONE SEEN /lpf
Epithelial Cells (non renal): >10 /hpf — AB (ref 0–10)
WBC, UA: >30 /hpf — AB (ref 0–5)

## 2020-02-14 ENCOUNTER — Ambulatory Visit: Payer: Managed Care, Other (non HMO) | Admitting: Internal Medicine

## 2020-03-11 ENCOUNTER — Encounter: Payer: Self-pay | Admitting: Nurse Practitioner

## 2020-03-11 ENCOUNTER — Ambulatory Visit: Payer: Managed Care, Other (non HMO) | Admitting: Nurse Practitioner

## 2020-03-11 ENCOUNTER — Other Ambulatory Visit: Payer: Self-pay

## 2020-03-11 VITALS — BP 136/60 | HR 85 | Temp 97.3°F | Resp 16 | Ht 65.0 in | Wt 302.4 lb

## 2020-03-11 DIAGNOSIS — M170 Bilateral primary osteoarthritis of knee: Secondary | ICD-10-CM | POA: Diagnosis not present

## 2020-03-11 DIAGNOSIS — F321 Major depressive disorder, single episode, moderate: Secondary | ICD-10-CM

## 2020-03-11 DIAGNOSIS — I10 Essential (primary) hypertension: Secondary | ICD-10-CM

## 2020-03-11 MED ORDER — PREDNISONE 10 MG (21) PO TBPK: ORAL_TABLET | ORAL | 0 refills | Status: DC

## 2020-03-11 MED ORDER — HYDROCODONE-ACETAMINOPHEN 5-325 MG PO TABS: 1.0000 | ORAL_TABLET | Freq: Two times a day (BID) | ORAL | 0 refills | Status: DC | PRN

## 2020-03-11 NOTE — Progress Notes (Signed)
[facility] [place], [place] [address]  Internal MEDICINE  Office Visit Note  Patient Name: April George  [id number]  [id number]  Date of Service: 04/09/2020  Chief Complaint  Patient presents with  . Follow-up    Knees hurt mostly at night, pain will wake pt up  . Hypertension  . Asthma    The patient is here for routine follow up.  -bilateral knee pain. Pain so severe it is keeping her awake at night. Radiating into her back. She is taking celebrex twice daily, but states that her knees have never hurt this bad. Has not had imaging of her knees in, at least, the last 6 years.  -have to reschedule pulmonary function test. Had to cancel this appointment due to two deaths in immediate family. First, her husband's father passed away. Second, her son lost his unborn child at [redacted] weeks gestation. Daughter-in-law had to deliver the baby. They were able to being baby home and have funeral and burial services for the child.  -needs to have lab work redone.        Current Medication: Outpatient Encounter Medications as of 03/11/2020  Medication Sig  . albuterol (VENTOLIN HFA) 108 (90 Base) MCG/ACT inhaler Inhale 2 puffs into the lungs every 6 (six) hours as needed for wheezing or shortness of breath.  Marland Kitchen amLODipine (NORVASC) 10 MG tablet Take 1 tablet (10 mg total) by mouth daily.  Marland Kitchen buPROPion (WELLBUTRIN) 100 MG tablet Take 1 tablet (100 mg total) by mouth daily.  . celecoxib (CELEBREX) 50 MG capsule Take 1 capsule (50 mg total) by mouth 2 (two) times daily.  Marland Kitchen desloratadine (CLARINEX REDITAB) 5 MG disintegrating tablet Take 1 tablet (5 mg total) by mouth daily as needed.  . fluticasone (FLONASE) 50 MCG/ACT nasal spray Place 2 sprays into both nostrils daily.  . furosemide (LASIX) 20 MG tablet Take 1 tablet (20 mg total) by mouth daily.  . hydrOXYzine (ATARAX/VISTARIL) 10 MG tablet Take 1-2 tablets (10-20 mg total) by mouth 2 (two) times daily as needed. For  severe anxiety  . lisinopril (ZESTRIL) 20 MG tablet Take 2 tablets (40 mg total) by mouth daily.  . phentermine (ADIPEX-P) 37.5 MG tablet Take 1 tablet (37.5 mg total) by mouth daily before breakfast.  . tretinoin (RETIN-A) 0.025 % cream Apply topically at bedtime.  Marland Kitchen HYDROcodone-acetaminophen (NORCO/VICODIN) 5-325 MG tablet Take 1 tablet by mouth 2 (two) times daily as needed for moderate pain.  . [DISCONTINUED] predniSONE (STERAPRED UNI-PAK 21 TAB) 10 MG (21) TBPK tablet 6 day taper - take by mouth as directed for 6 days   No facility-administered encounter medications on file as of 03/11/2020.    Surgical History: Past Surgical History:  Procedure Laterality Date  . ABDOMINAL HYSTERECTOMY    . CARPAL TUNNEL RELEASE Bilateral   . COLONOSCOPY    . COLONOSCOPY WITH PROPOFOL N/A 02/23/2019   Procedure: COLONOSCOPY WITH PROPOFOL;  Surgeon: Jonathon Bellows, MD;  Location: [facility] ENDOSCOPY;  Service: Gastroenterology;  Laterality: N/A;    Medical History: Past Medical History:  Diagnosis Date  . Asthma   . Hypertension     Family History: Family History  Problem Relation Age of Onset  . Breast cancer Mother 36  . Breast cancer Cousin   . Depression Sister   . Anxiety disorder Sister     Social History   Socioeconomic History  . Marital status: Married    Spouse name: Lanny Hurst  . Number of children: 2  .  Years of education: Not on file  . Highest education level: Associate degree: occupational, Hotel manager, or vocational program  Occupational History  . Not on file  Tobacco Use  . Smoking status: Never Smoker  . Smokeless tobacco: Never Used  Vaping Use  . Vaping Use: Never used  Substance and Sexual Activity  . Alcohol use: No  . Drug use: No  . Sexual activity: Yes  Other Topics Concern  . Not on file  Social History Narrative  . Not on file   Social Determinants of Health   Financial Resource Strain: Not on file  Food Insecurity: Not on file  Transportation Needs:  Not on file  Physical Activity: Not on file  Stress: Not on file  Social Connections: Not on file  Intimate Partner Violence: Not on file      Review of Systems  Constitutional: Negative for activity change, chills, fatigue and unexpected weight change.  HENT: Negative for congestion, postnasal drip, rhinorrhea, sneezing and sore throat.   Respiratory: Negative for cough, chest tightness and shortness of breath.   Cardiovascular: Negative for chest pain and palpitations.  Gastrointestinal: Negative for abdominal pain, constipation, diarrhea, nausea and vomiting.  Genitourinary: Negative for dysuria and frequency.  Musculoskeletal: Positive for arthralgias and joint swelling. Negative for back pain and neck pain.       Severe bilateral knee pain. There is swelling present.  Pain so severe that it is keeping her awake at night.   Skin: Negative for rash.  Allergic/Immunologic: Negative for environmental allergies.  Neurological: Negative for dizziness, tremors, numbness and headaches.  Hematological: Negative for adenopathy. Does not bruise/bleed easily.  Psychiatric/Behavioral: Negative for behavioral problems (Depression), sleep disturbance and suicidal ideas. The patient is not nervous/anxious.     Today's Vitals   03/11/20 1458  BP: 136/60  Pulse: 85  Resp: 16  Temp: (!) 97.3 F (36.3 C)  SpO2: 99%  Weight: (!) 302 lb 6.4 oz (137.2 kg)  Height: 5\' 5"  (1.651 m)   Body mass index is 50.32 kg/m.  Physical Exam Vitals and nursing note reviewed.  Constitutional:      General: She is not in acute distress.    Appearance: Normal appearance. She is well-developed. She is obese. She is not diaphoretic.  HENT:     Head: Normocephalic and atraumatic.     Mouth/Throat:     Pharynx: No oropharyngeal exudate.  Eyes:     Pupils: Pupils are equal, round, and reactive to light.  Neck:     Thyroid: No thyromegaly.     Vascular: No carotid bruit or JVD.     Trachea: No tracheal  deviation.  Cardiovascular:     Rate and Rhythm: Normal rate and regular rhythm.     Heart sounds: Normal heart sounds. No murmur heard. No friction rub. No gallop.   Pulmonary:     Effort: Pulmonary effort is normal. No respiratory distress.     Breath sounds: Normal breath sounds. No wheezing or rales.  Chest:     Chest wall: No tenderness.  Abdominal:     Palpations: Abdomen is soft.  Musculoskeletal:        General: Normal range of motion.     Cervical back: Normal range of motion and neck supple.     Comments: She has bilateral knee pain with swelling. ROM and strength are mildly reduced due to pain. There is crepitus felt with flexion and extension of the knees.   Lymphadenopathy:     Cervical:  No cervical adenopathy.  Skin:    General: Skin is warm and dry.  Neurological:     General: No focal deficit present.     Mental Status: She is alert and oriented to person, place, and time.     Cranial Nerves: No cranial nerve deficit.  Psychiatric:        Attention and Perception: Attention and perception normal.        Mood and Affect: Mood is anxious and depressed. Affect is tearful.        Speech: Speech normal.        Behavior: Behavior normal. Behavior is cooperative.        Thought Content: Thought content normal.        Cognition and Memory: Cognition and memory normal.        Judgment: Judgment normal.     Comments: Mood improved since her most recent visit.     Assessment/Plan: 1. Primary osteoarthritis of both knees Patient should continue celebrex 50mg  twice daily. Added hydrocodone/APAP 5/325mg  up to twice daily as needed for severe pain. Single prescription for #10 tablets sent to her pharmacy. Will get X-rays of both knees for further evaluation. Refer to orthopedics as indicated.  - DG Knee 1-2 Views Right; Future - DG Knee 1-2 Views Left; Future - HYDROcodone-acetaminophen (NORCO/VICODIN) 5-325 MG tablet; Take 1 tablet by mouth 2 (two) times daily as needed for  moderate pain.  Dispense: 10 tablet; Refill: 0  2. Hypertension, unspecified type Stable. Continue BP medication as prescribed.   3. Depression, major, single episode, moderate (HCC) Improved on current medication. Continue as prescribed.   General Counseling: claudett bayly understanding of the findings of todays visit and agrees with plan of treatment. I have discussed any further diagnostic evaluation that may be needed or ordered today. We also reviewed her medications today. she has been encouraged to call the office with any questions or concerns that should arise related to todays visit.  This patient was seen by Leretha Pol FNP Collaboration with Dr Lavera Guise as a part of collaborative care agreement  Orders Placed This Encounter  Procedures  . DG Knee 1-2 Views Right  . DG Knee 1-2 Views Left    Meds ordered this encounter  Medications  . DISCONTD: predniSONE (STERAPRED UNI-PAK 21 TAB) 10 MG (21) TBPK tablet    Sig: 6 day taper - take by mouth as directed for 6 days    Dispense:  21 tablet    Refill:  0    Order Specific Question:   Supervising Provider    Answer:   Lavera Guise [employer]  . HYDROcodone-acetaminophen (NORCO/VICODIN) 5-325 MG tablet    Sig: Take 1 tablet by mouth 2 (two) times daily as needed for moderate pain.    Dispense:  10 tablet    Refill:  0    Order Specific Question:   Supervising Provider    Answer:   Lavera Guise [[id number]]    Total time spent: 30 Minutes Time spent includes review of chart, medications, test results, and follow up plan with the patient.      Dr Lavera Guise Internal medicine

## 2020-03-13 ENCOUNTER — Other Ambulatory Visit: Payer: Self-pay

## 2020-03-13 ENCOUNTER — Ambulatory Visit: Payer: Managed Care, Other (non HMO) | Admitting: Internal Medicine

## 2020-03-13 DIAGNOSIS — R0602 Shortness of breath: Secondary | ICD-10-CM

## 2020-03-13 DIAGNOSIS — J4520 Mild intermittent asthma, uncomplicated: Secondary | ICD-10-CM

## 2020-03-13 LAB — PULMONARY FUNCTION TEST

## 2020-03-14 ENCOUNTER — Other Ambulatory Visit: Payer: Self-pay | Admitting: Nurse Practitioner

## 2020-03-15 LAB — COMPREHENSIVE METABOLIC PANEL
ALT: 15 IU/L (ref 0–32)
AST: 21 IU/L (ref 0–40)
Albumin/Globulin Ratio: 1.5 (ref 1.2–2.2)
Albumin: 4.4 g/dL (ref 3.8–4.9)
Alkaline Phosphatase: 79 IU/L (ref 44–121)
BUN/Creatinine Ratio: 19 (ref 9–23)
BUN: 19 mg/dL (ref 6–24)
Bilirubin Total: 0.4 mg/dL (ref 0.0–1.2)
CO2: 24 mmol/L (ref 20–29)
Calcium: 9.3 mg/dL (ref 8.7–10.2)
Chloride: 101 mmol/L (ref 96–106)
Creatinine, Ser: 0.99 mg/dL (ref 0.57–1.00)
GFR calc Af Amer: 73 mL/min/{1.73_m2} (ref >59)
GFR calc non Af Amer: 63 mL/min/{1.73_m2} (ref >59)
Globulin, Total: 3 g/dL (ref 1.5–4.5)
Glucose: 91 mg/dL (ref 65–99)
Potassium: 4 mmol/L (ref 3.5–5.2)
Sodium: 140 mmol/L (ref 134–144)
Total Protein: 7.4 g/dL (ref 6.0–8.5)

## 2020-03-15 LAB — CBC
Hematocrit: 39.3 % (ref 34.0–46.6)
Hemoglobin: 12.7 g/dL (ref 11.1–15.9)
MCH: 27.1 pg (ref 26.6–33.0)
MCHC: 32.3 g/dL (ref 31.5–35.7)
MCV: 84 fL (ref 79–97)
Platelets: 290 10*3/uL (ref 150–450)
RBC: 4.69 x10E6/uL (ref 3.77–5.28)
RDW: 12.9 % (ref 11.7–15.4)
WBC: 6.3 10*3/uL (ref 3.4–10.8)

## 2020-03-15 LAB — HCV INTERPRETATION

## 2020-03-15 LAB — LIPID PANEL WITH LDL/HDL RATIO
Cholesterol, Total: 172 mg/dL (ref 100–199)
HDL: 73 mg/dL (ref >39)
LDL Chol Calc (NIH): 84 mg/dL (ref 0–99)
LDL/HDL Ratio: 1.2 ratio (ref 0.0–3.2)
Triglycerides: 78 mg/dL (ref 0–149)
VLDL Cholesterol Cal: 15 mg/dL (ref 5–40)

## 2020-03-15 LAB — T4, FREE: Free T4: 0.96 ng/dL (ref 0.82–1.77)

## 2020-03-15 LAB — HCV AB W REFLEX TO QUANT PCR: HCV Ab: <0.1 s/co ratio (ref 0.0–0.9)

## 2020-03-15 LAB — VITAMIN D 25 HYDROXY (VIT D DEFICIENCY, FRACTURES): Vit D, 25-Hydroxy: 9.7 ng/mL — ABNORMAL LOW (ref 30.0–100.0)

## 2020-03-15 LAB — TSH: TSH: 0.984 u[IU]/mL (ref 0.450–4.500)

## 2020-03-21 ENCOUNTER — Other Ambulatory Visit: Payer: Self-pay

## 2020-03-21 ENCOUNTER — Ambulatory Visit
Admission: RE | Admit: 2020-03-21 | Discharge: 2020-03-21 | Disposition: A | Discharge status: Home / Self Care | Payer: Managed Care, Other (non HMO) | Source: Ambulatory Visit | Attending: Nurse Practitioner | Admitting: Nurse Practitioner

## 2020-03-21 ENCOUNTER — Ambulatory Visit
Admission: RE | Admit: 2020-03-21 | Discharge: 2020-03-21 | Disposition: A | Discharge status: Home / Self Care | Payer: Managed Care, Other (non HMO) | Attending: Nurse Practitioner | Admitting: Nurse Practitioner

## 2020-03-21 DIAGNOSIS — M170 Bilateral primary osteoarthritis of knee: Secondary | ICD-10-CM | POA: Insufficient documentation

## 2020-03-24 NOTE — Progress Notes (Signed)
All labs look good. Discss at next visit

## 2020-03-24 NOTE — Progress Notes (Signed)
Moderate degenerative arthritis of left knee. Refer to orthopedics.

## 2020-03-25 NOTE — Progress Notes (Signed)
Visit scheduled 1/4

## 2020-03-26 ENCOUNTER — Encounter: Payer: Self-pay | Admitting: Hospice and Palliative Medicine

## 2020-03-26 ENCOUNTER — Ambulatory Visit: Payer: Managed Care, Other (non HMO) | Admitting: Hospice and Palliative Medicine

## 2020-03-26 VITALS — BP 132/80 | HR 88 | Temp 98.8°F | Resp 16 | Ht 65.0 in | Wt 308.0 lb

## 2020-03-26 DIAGNOSIS — G8929 Other chronic pain: Secondary | ICD-10-CM

## 2020-03-26 DIAGNOSIS — M5441 Lumbago with sciatica, right side: Secondary | ICD-10-CM | POA: Diagnosis not present

## 2020-03-26 DIAGNOSIS — M25562 Pain in left knee: Secondary | ICD-10-CM

## 2020-03-26 DIAGNOSIS — M25561 Pain in right knee: Secondary | ICD-10-CM

## 2020-03-26 DIAGNOSIS — R0602 Shortness of breath: Secondary | ICD-10-CM

## 2020-03-26 DIAGNOSIS — E559 Vitamin D deficiency, unspecified: Secondary | ICD-10-CM

## 2020-03-26 MED ORDER — RYBELSUS 3 MG PO TABS: 3.0000 mg | ORAL_TABLET | Freq: Once | ORAL | 2 refills | Status: AC

## 2020-03-26 MED ORDER — VITAMIN D (ERGOCALCIFEROL) 1.25 MG (50000 UNIT) PO CAPS: 50000.0000 [IU] | ORAL_CAPSULE | ORAL | 0 refills | Status: DC

## 2020-03-26 MED ORDER — GABAPENTIN 100 MG PO CAPS: 100.0000 mg | ORAL_CAPSULE | Freq: Three times a day (TID) | ORAL | 0 refills | Status: DC

## 2020-03-26 NOTE — Progress Notes (Signed)
[facility] [address] [place], [place] [address]  Internal MEDICINE  Office Visit Note  Patient Name: April George  [id number]  [id number]  Date of Service: 03/27/2020  Chief Complaint  Patient presents with  . Follow-up    PFT and labs     HPI Patient is here for routine follow-up Reviewed PFT results-mild restrictive and mild obstructive lung disease,hisotry of asthma, explained likely affected to morbid obesity  Reviewed x-ray results of bilateral knees-left knee, moderate degenerative changes, right knee, moderate degenerative changes--pain still present, pain has now progressed to low back pain and tingling in right foot and toes Has had low back pain with sciatica before and received spinal injections--several months ago, had not been seen by ortho since  Reviewed recent labs--vitamin D significantly low  Weight management--has been on phentermine in the past, had success, she expresses readiness to take her weight management seriously   Current Medication: Outpatient Encounter Medications as of 03/26/2020  Medication Sig  . albuterol (VENTOLIN HFA) 108 (90 Base) MCG/ACT inhaler Inhale 2 puffs into the lungs every 6 (six) hours as needed for wheezing or shortness of breath.  Marland Kitchen amLODipine (NORVASC) 10 MG tablet Take 1 tablet (10 mg total) by mouth daily.  Marland Kitchen buPROPion (WELLBUTRIN) 100 MG tablet Take 1 tablet (100 mg total) by mouth daily.  . celecoxib (CELEBREX) 50 MG capsule Take 1 capsule (50 mg total) by mouth 2 (two) times daily.  Marland Kitchen desloratadine (CLARINEX REDITAB) 5 MG disintegrating tablet Take 1 tablet (5 mg total) by mouth daily as needed.  . fluticasone (FLONASE) 50 MCG/ACT nasal spray Place 2 sprays into both nostrils daily.  . furosemide (LASIX) 20 MG tablet Take 1 tablet (20 mg total) by mouth daily.  Marland Kitchen gabapentin (NEURONTIN) 100 MG capsule Take 1 capsule (100 mg total) by mouth 3 (three) times daily.  Marland Kitchen HYDROcodone-acetaminophen (NORCO/VICODIN)  5-325 MG tablet Take 1 tablet by mouth 2 (two) times daily as needed for moderate pain.  . hydrOXYzine (ATARAX/VISTARIL) 10 MG tablet Take 1-2 tablets (10-20 mg total) by mouth 2 (two) times daily as needed. For severe anxiety  . lisinopril (ZESTRIL) 20 MG tablet Take 2 tablets (40 mg total) by mouth daily.  . phentermine (ADIPEX-P) 37.5 MG tablet Take 1 tablet (37.5 mg total) by mouth daily before breakfast.  . [EXPIRED] Semaglutide (RYBELSUS) 3 MG TABS Take 3 mg by mouth once for 1 dose.  . tretinoin (RETIN-A) 0.025 % cream Apply topically at bedtime.  . Vitamin D, Ergocalciferol, (DRISDOL) 1.25 MG (50000 UNIT) CAPS capsule Take 1 capsule (50,000 Units total) by mouth every 7 (seven) days.  . [DISCONTINUED] predniSONE (STERAPRED UNI-PAK 21 TAB) 10 MG (21) TBPK tablet 6 day taper - take by mouth as directed for 6 days   No facility-administered encounter medications on file as of 03/26/2020.    Surgical History: Past Surgical History:  Procedure Laterality Date  . ABDOMINAL HYSTERECTOMY    . CARPAL TUNNEL RELEASE Bilateral   . COLONOSCOPY    . COLONOSCOPY WITH PROPOFOL N/A 02/23/2019   Procedure: COLONOSCOPY WITH PROPOFOL;  Surgeon: Wyline Mood, MD;  Location: [facility] ENDOSCOPY;  Service: Gastroenterology;  Laterality: N/A;    Medical History: Past Medical History:  Diagnosis Date  . Asthma   . Hypertension     Family History: Family History  Problem Relation Age of Onset  . Breast cancer Mother 56  . Breast cancer Cousin   . Depression Sister   . Anxiety disorder Sister  Social History   Socioeconomic History  . Marital status: Married    Spouse name: Lanny Hurst  . Number of children: 2  . Years of education: Not on file  . Highest education level: Associate degree: occupational, Hotel manager, or vocational program  Occupational History  . Not on file  Tobacco Use  . Smoking status: Never Smoker  . Smokeless tobacco: Never Used  Vaping Use  . Vaping Use: Never used   Substance and Sexual Activity  . Alcohol use: No  . Drug use: No  . Sexual activity: Yes  Other Topics Concern  . Not on file  Social History Narrative  . Not on file   Social Determinants of Health   Financial Resource Strain: Not on file  Food Insecurity: Not on file  Transportation Needs: Not on file  Physical Activity: Not on file  Stress: Not on file  Social Connections: Not on file  Intimate Partner Violence: Not on file      Review of Systems  Constitutional: Negative for chills, diaphoresis and fatigue.  HENT: Negative for ear pain, postnasal drip and sinus pressure.   Eyes: Negative for photophobia, discharge, redness, itching and visual disturbance.  Respiratory: Positive for shortness of breath. Negative for cough and wheezing.   Cardiovascular: Negative for chest pain, palpitations and leg swelling.  Gastrointestinal: Negative for abdominal pain, constipation, diarrhea, nausea and vomiting.  Genitourinary: Negative for dysuria and flank pain.  Musculoskeletal: Positive for arthralgias and back pain. Negative for gait problem and neck pain.  Skin: Negative for color change.  Allergic/Immunologic: Negative for environmental allergies and food allergies.  Neurological: Negative for dizziness and headaches.  Hematological: Does not bruise/bleed easily.  Psychiatric/Behavioral: Negative for agitation, behavioral problems (depression) and hallucinations.    Vital Signs: BP 132/80   Pulse 88   Temp 98.8 F (37.1 C)   Resp 16   Ht 5\' 5"  (1.651 m)   Wt (!) 308 lb (139.7 kg)   SpO2 98%   BMI 51.25 kg/m    Physical Exam Constitutional:      Appearance: Normal appearance. She is obese.  Cardiovascular:     Rate and Rhythm: Normal rate and regular rhythm.     Pulses: Normal pulses.     Heart sounds: Normal heart sounds.  Pulmonary:     Effort: Pulmonary effort is normal.     Breath sounds: Normal breath sounds.  Musculoskeletal:        General: Normal  range of motion.  Skin:    General: Skin is warm.  Neurological:     General: No focal deficit present.     Mental Status: She is alert and oriented to person, place, and time. Mental status is at baseline.  Psychiatric:        Mood and Affect: Mood normal.        Behavior: Behavior normal.        Thought Content: Thought content normal.        Judgment: Judgment normal.    Assessment/Plan: 1. Chronic right-sided low back pain with right-sided sciatica Referral to ortho for possible repeat spinal injection Start low dose gabapentin for sciatica pain--temporary plan for symptom relief - Ambulatory referral to Orthopedic Surgery - gabapentin (NEURONTIN) 100 MG capsule; Take 1 capsule (100 mg total) by mouth 3 (three) times daily.  Dispense: 90 capsule; Refill: 0  2. SOB (shortness of breath) PFT mild restrictive and obstructive Likely exacerbated by morbid obesity  3. Acute pain of both knees Moderate degenerative  changes bilaterally Referral placed to ortho  4. Morbid obesity ([place]) Strongly advised to seriously consider weight management Discussed negative side effects associated with phentermine Will start low dose Reybelsus for weight management assistance BMI 51--negatively impacting breathing, discussed negative risks associated with morbid obesity We discussed intermittent fasting as well as calorie restriction - Semaglutide (RYBELSUS) 3 MG TABS; Take 3 mg by mouth once for 1 dose.  Dispense: 30 tablet; Refill: 2  5. Vitamin D deficiency Will start vitamin d supplements - Vitamin D, Ergocalciferol, (DRISDOL) 1.25 MG (50000 UNIT) CAPS capsule; Take 1 capsule (50,000 Units total) by mouth every 7 (seven) days.  Dispense: 5 capsule; Refill: 0  General Counseling: Rosan verbalizes understanding of the findings of todays visit and agrees with plan of treatment. I have discussed any further diagnostic evaluation that may be needed or ordered today. We also reviewed her  medications today. she has been encouraged to call the office with any questions or concerns that should arise related to todays visit.  Obesity Counseling: Risk Assessment: An assessment of behavioral risk factors was made today and includes lack of exercise sedentary lifestyle, lack of portion control and poor dietary habits.  Risk Modification Advice: She was counseled on portion control guidelines. Restricting daily caloric intake to 1800. The detrimental long term effects of obesity on her health and ongoing poor compliance was also discussed with the patient.  Orders Placed This Encounter  Procedures  . Ambulatory referral to Orthopedic Surgery    Meds ordered this encounter  Medications  . gabapentin (NEURONTIN) 100 MG capsule    Sig: Take 1 capsule (100 mg total) by mouth 3 (three) times daily.    Dispense:  90 capsule    Refill:  0  . Vitamin D, Ergocalciferol, (DRISDOL) 1.25 MG (50000 UNIT) CAPS capsule    Sig: Take 1 capsule (50,000 Units total) by mouth every 7 (seven) days.    Dispense:  5 capsule    Refill:  0  . Semaglutide (RYBELSUS) 3 MG TABS    Sig: Take 3 mg by mouth once for 1 dose.    Dispense:  30 tablet    Refill:  2    Time spent: 30 Minutes Time spent includes review of chart, medications, test results and follow-up plan with the patient.  This patient was seen by Theodoro Grist AGNP-C in Collaboration with Dr Lavera Guise as a part of collaborative care agreement     Tanna Furry. Othon Guardia AGNP-C Internal medicine

## 2020-03-27 ENCOUNTER — Encounter: Payer: Self-pay | Admitting: Hospice and Palliative Medicine

## 2020-04-03 ENCOUNTER — Telehealth: Payer: Self-pay

## 2020-04-03 NOTE — Telephone Encounter (Signed)
Left message advising pt of ortho appt. April George 

## 2020-04-04 ENCOUNTER — Ambulatory Visit: Payer: Managed Care, Other (non HMO) | Admitting: Nurse Practitioner

## 2020-04-04 ENCOUNTER — Other Ambulatory Visit: Payer: Self-pay

## 2020-04-04 ENCOUNTER — Telehealth: Payer: Self-pay

## 2020-04-04 VITALS — BP 143/70 | HR 91 | Temp 97.5°F | Resp 16 | Ht 65.0 in | Wt 308.4 lb

## 2020-04-04 DIAGNOSIS — R7301 Impaired fasting glucose: Secondary | ICD-10-CM

## 2020-04-04 DIAGNOSIS — H1013 Acute atopic conjunctivitis, bilateral: Secondary | ICD-10-CM

## 2020-04-04 DIAGNOSIS — H1032 Unspecified acute conjunctivitis, left eye: Secondary | ICD-10-CM | POA: Diagnosis not present

## 2020-04-04 DIAGNOSIS — I10 Essential (primary) hypertension: Secondary | ICD-10-CM | POA: Diagnosis not present

## 2020-04-04 MED ORDER — OLOPATADINE HCL 0.2 % OP SOLN: 1.0000 [drp] | Freq: Every day | OPHTHALMIC | 2 refills | Status: DC

## 2020-04-04 MED ORDER — POLYMYXIN B-TRIMETHOPRIM 10000-0.1 UNIT/ML-% OP SOLN: 1.0000 [drp] | OPHTHALMIC | 0 refills | Status: DC

## 2020-04-04 MED ORDER — RYBELSUS 3 MG PO TABS: 3.0000 mg | ORAL_TABLET | Freq: Every day | ORAL | 2 refills | Status: DC

## 2020-04-04 NOTE — Procedures (Signed)
[facility] [place], [address]  DATE OF SERVICE: March 13, 2020  Complete Pulmonary Function Testing Interpretation:  FINDINGS:  Forced vital capacity is mildly decreased the FEV1 is 1.56 L which is 71% of predicted and is mildly decreased.  FEV1 FVC ratio is mildly decreased.  Postbronchodilator there is no significant change in FEV1.  Total lung capacity is mildly decreased residual volume is normal residual internal capacity ratio is increased FRC is decreased DLCO was increased  IMPRESSION:  This pulmonary function study is consistent with mild obstructive lung disease and mild restrictive lung disease.  [name], MD [facility] Pulmonary Critical Care Medicine Sleep Medicine

## 2020-04-04 NOTE — Progress Notes (Signed)
[facility] [place], [place] [address]  Internal MEDICINE  Office Visit Note  Patient Name: April George  [id number]  [id number]  Date of Service: 04/13/2020   Pt is here for a sick visit.   Chief Complaint  Patient presents with  . Acute Visit     The patient is here for acute visit.  -left eye irritation and watering. Very itchy. George crusting. Swelling of lower eyelid.  -history of allergies. Does not take allergy medication as often as she should.  -had trouble getting rybelsus prescription. Was not given reason why it was not filled. Will send new prescription today. Request PA paperwork as needed.        Current Medication:  Outpatient Encounter Medications as of 04/04/2020  Medication Sig  . albuterol (VENTOLIN HFA) 108 (90 Base) MCG/ACT inhaler Inhale 2 puffs into the lungs every 6 (six) hours as needed for wheezing or shortness of breath.  Marland Kitchen amLODipine (NORVASC) 10 MG tablet Take 1 tablet (10 mg total) by mouth daily.  Marland Kitchen buPROPion (WELLBUTRIN) 100 MG tablet Take 1 tablet (100 mg total) by mouth daily.  . celecoxib (CELEBREX) 50 MG capsule Take 1 capsule (50 mg total) by mouth 2 (two) times daily.  Marland Kitchen desloratadine (CLARINEX REDITAB) 5 MG disintegrating tablet Take 1 tablet (5 mg total) by mouth daily as needed.  . fluticasone (FLONASE) 50 MCG/ACT nasal spray Place 2 sprays into both nostrils daily.  . furosemide (LASIX) 20 MG tablet Take 1 tablet (20 mg total) by mouth daily.  Marland Kitchen gabapentin (NEURONTIN) 100 MG capsule Take 1 capsule (100 mg total) by mouth 3 (three) times daily.  Marland Kitchen HYDROcodone-acetaminophen (NORCO/VICODIN) 5-325 MG tablet Take 1 tablet by mouth 2 (two) times daily as needed for moderate pain.  . hydrOXYzine (ATARAX/VISTARIL) 10 MG tablet Take 1-2 tablets (10-20 mg total) by mouth 2 (two) times daily as needed. For severe anxiety  . lisinopril (ZESTRIL) 20 MG tablet Take 2 tablets (40 mg total) by mouth daily.  . Olopatadine HCl  0.2 % SOLN Apply 1 drop to eye daily.  . phentermine (ADIPEX-P) 37.5 MG tablet Take 1 tablet (37.5 mg total) by mouth daily before breakfast.  . Semaglutide (RYBELSUS) 3 MG TABS Take 3 mg by mouth daily.  Marland Kitchen tretinoin (RETIN-A) 0.025 % cream Apply topically at bedtime.  Marland Kitchen trimethoprim-polymyxin b (POLYTRIM) ophthalmic solution Place 1 drop into both eyes every 4 (four) hours.  . Vitamin D, Ergocalciferol, (DRISDOL) 1.25 MG (50000 UNIT) CAPS capsule Take 1 capsule (50,000 Units total) by mouth every 7 (seven) days.   George facility-administered encounter medications on file as of 04/04/2020.      Medical History: Past Medical History:  Diagnosis Date  . Asthma   . Hypertension      Today's Vitals   04/04/20 1433  BP: (!) 143/70  Pulse: 91  Resp: 16  Temp: (!) 97.5 F (36.4 C)  SpO2: 95%  Weight: (!) 308 lb 6.4 oz (139.9 kg)  Height: 5\' 5"  (1.651 m)   Body mass index is 51.32 kg/m.  Review of Systems  Constitutional: Negative for chills, fatigue and unexpected weight change.  HENT: Negative for congestion, postnasal drip, rhinorrhea, sneezing and sore throat.   Eyes: Positive for redness and itching.  Respiratory: Negative for cough, chest tightness and shortness of breath.   Cardiovascular: Negative for chest pain and palpitations.  Gastrointestinal: Negative for abdominal pain, constipation, diarrhea, nausea and vomiting.  Musculoskeletal: Negative for arthralgias, back pain,  joint swelling and neck pain.  Skin: Negative for rash.  Neurological: Negative.  Negative for tremors and numbness.  Hematological: Negative for adenopathy. Does not bruise/bleed easily.  Psychiatric/Behavioral: Negative for behavioral problems (Depression), sleep disturbance and suicidal ideas. The patient is not nervous/anxious.     Physical Exam Vitals and nursing note reviewed.  Constitutional:      General: She is not in acute distress.    Appearance: Normal appearance. She is well-developed  and well-nourished. She is obese. She is not diaphoretic.  HENT:     Head: Normocephalic and atraumatic.     Mouth/Throat:     Mouth: Oropharynx is clear and moist.     Pharynx: George oropharyngeal exudate.  Eyes:     Extraocular Movements: EOM normal.     Pupils: Pupils are equal, round, and reactive to light.     Comments: Right eye very red with lowe lid swelling. Excess tearing present.   Neck:     Thyroid: George thyromegaly.     Vascular: George JVD.     Trachea: George tracheal deviation.  Cardiovascular:     Rate and Rhythm: Normal rate and regular rhythm.     Heart sounds: Normal heart sounds. George murmur heard. George friction rub. George gallop.   Pulmonary:     Effort: Pulmonary effort is normal. George respiratory distress.     Breath sounds: George wheezing or rales.  Chest:     Chest wall: George tenderness.  Abdominal:     General: Bowel sounds are normal.     Palpations: Abdomen is soft.  Musculoskeletal:        General: Normal range of motion.     Cervical back: Normal range of motion and neck supple.  Lymphadenopathy:     Cervical: George cervical adenopathy.  Skin:    General: Skin is warm and dry.  Neurological:     Mental Status: She is alert and oriented to person, place, and time.     Cranial Nerves: George cranial nerve deficit.  Psychiatric:        Mood and Affect: Mood and affect normal.        Behavior: Behavior normal.        Thought Content: Thought content normal.        Judgment: Judgment normal.    Assessment/Plan: 1. Acute conjunctivitis of left eye, unspecified acute conjunctivitis type Start polytrim eye drops. Use 1 drop in both eyes four times daily for next week.  - trimethoprim-polymyxin b (POLYTRIM) ophthalmic solution; Place 1 drop into both eyes every 4 (four) hours.  Dispense: 10 mL; Refill: 0   2. Allergic conjunctivitis of both eyes After she completes antibiotic eye drops, she should start pataday eye drops, putting one drop in both eyes daily.  - Olopatadine HCl  0.2 % SOLN; Apply 1 drop to eye daily.  Dispense: 2.5 mL; Refill: 2   3. Impaired fasting glucose New prescription for rybelsus 3mg  sent to her pharmacy today.  - Semaglutide (RYBELSUS) 3 MG TABS; Take 3 mg by mouth daily.  Dispense: 30 tablet; Refill: 2  4. Hypertension, unspecified type Stable. Continue bp medication as prescribed.  General Counseling: jacarra attridge understanding of the findings of todays visit and agrees with plan of treatment. I have discussed any further diagnostic evaluation that may be needed or ordered today. We also reviewed her medications today. she has been encouraged to call the office with any questions or concerns that should arise related to todays visit.  Counseling:   This patient was seen by [place] with Dr Lavera Guise as a part of collaborative care agreement  Meds ordered this encounter  Medications  . trimethoprim-polymyxin b (POLYTRIM) ophthalmic solution    Sig: Place 1 drop into both eyes every 4 (four) hours.    Dispense:  10 mL    Refill:  0    Order Specific Question:   Supervising Provider    Answer:   Lavera Guise [[id number]]  . Olopatadine HCl 0.2 % SOLN    Sig: Apply 1 drop to eye daily.    Dispense:  2.5 mL    Refill:  2    Order Specific Question:   Supervising Provider    Answer:   Lavera Guise [[id number]]  . Semaglutide (RYBELSUS) 3 MG TABS    Sig: Take 3 mg by mouth daily.    Dispense:  30 tablet    Refill:  2    Please send any prior authorization paperwork needed. Thanks.    Order Specific Question:   Supervising Provider    Answer:   Lavera Guise [[id number]]    Time spent: 25 Minutes

## 2020-04-04 NOTE — Telephone Encounter (Signed)
PA for RYBELSUS 3 mg was approved on 04/03/2020 with case # [id number] valid 04/02/2020 to 04/02/2021

## 2020-04-13 ENCOUNTER — Encounter: Payer: Self-pay | Admitting: Nurse Practitioner

## 2020-04-13 DIAGNOSIS — R7301 Impaired fasting glucose: Secondary | ICD-10-CM | POA: Insufficient documentation

## 2020-04-13 DIAGNOSIS — H1032 Unspecified acute conjunctivitis, left eye: Secondary | ICD-10-CM | POA: Insufficient documentation

## 2020-04-18 ENCOUNTER — Encounter: Payer: Self-pay | Admitting: Physician Assistant

## 2020-04-18 ENCOUNTER — Ambulatory Visit: Payer: Managed Care, Other (non HMO) | Admitting: Physician Assistant

## 2020-04-18 DIAGNOSIS — I10 Essential (primary) hypertension: Secondary | ICD-10-CM

## 2020-04-18 DIAGNOSIS — M25562 Pain in left knee: Secondary | ICD-10-CM

## 2020-04-18 DIAGNOSIS — R600 Localized edema: Secondary | ICD-10-CM

## 2020-04-18 DIAGNOSIS — M5441 Lumbago with sciatica, right side: Secondary | ICD-10-CM | POA: Diagnosis not present

## 2020-04-18 DIAGNOSIS — M25561 Pain in right knee: Secondary | ICD-10-CM

## 2020-04-18 DIAGNOSIS — G8929 Other chronic pain: Secondary | ICD-10-CM

## 2020-04-18 DIAGNOSIS — Z6841 Body Mass Index (BMI) 40.0 and over, adult: Secondary | ICD-10-CM | POA: Diagnosis not present

## 2020-04-18 NOTE — Progress Notes (Signed)
[facility] [address] [place], [place] [address]  Internal MEDICINE  Office Visit Note  Patient Name: April George  [id number]  [id number]  Date of Service: 04/24/2020  Chief Complaint  Patient presents with  . Follow-up  . Hypertension  . Leg Pain    Legs swelling    HPI Pt is here today for weight loss program. She has only been on Rebelsys for 2 weeks due to issues getting the prescription. She has gained 2 pounds since last visit. She states she been eating a lot the past two weeks. She wants to work on portion control and eating better. She is going to PT now for back and knees and discussed that she has not been very active. BP is much more elevated today, states she ran in here today. She checks it at home 139/80. Legs are swollen, and have been the past 2 days. She has been on amlodipine for a long time and hasn't had this problem on it before. She is taking lasix as well.   Current Medication: Outpatient Encounter Medications as of 04/18/2020  Medication Sig  . amLODipine (NORVASC) 5 MG tablet Take 1 tablet (5 mg total) by mouth daily.  . furosemide (LASIX) 40 MG tablet Take 1 tablet (40 mg total) by mouth daily.  . Semaglutide (RYBELSUS) 7 MG TABS Take 7 mg by mouth daily.  Marland Kitchen albuterol (VENTOLIN HFA) 108 (90 Base) MCG/ACT inhaler Inhale 2 puffs into the lungs every 6 (six) hours as needed for wheezing or shortness of breath.  Marland Kitchen buPROPion (WELLBUTRIN) 100 MG tablet Take 1 tablet (100 mg total) by mouth daily.  . celecoxib (CELEBREX) 50 MG capsule Take 1 capsule (50 mg total) by mouth 2 (two) times daily.  Marland Kitchen desloratadine (CLARINEX REDITAB) 5 MG disintegrating tablet Take 1 tablet (5 mg total) by mouth daily as needed.  . fluticasone (FLONASE) 50 MCG/ACT nasal spray Place 2 sprays into both nostrils daily.  Marland Kitchen gabapentin (NEURONTIN) 100 MG capsule Take 1 capsule (100 mg total) by mouth 3 (three) times daily.  Marland Kitchen HYDROcodone-acetaminophen (NORCO/VICODIN) 5-325 MG  tablet Take 1 tablet by mouth 2 (two) times daily as needed for moderate pain.  . hydrOXYzine (ATARAX/VISTARIL) 10 MG tablet Take 1-2 tablets (10-20 mg total) by mouth 2 (two) times daily as needed. For severe anxiety  . lisinopril (ZESTRIL) 20 MG tablet Take 2 tablets (40 mg total) by mouth daily.  . Olopatadine HCl 0.2 % SOLN Apply 1 drop to eye daily.  . phentermine (ADIPEX-P) 37.5 MG tablet Take 1 tablet (37.5 mg total) by mouth daily before breakfast.  . tretinoin (RETIN-A) 0.025 % cream Apply topically at bedtime.  Marland Kitchen trimethoprim-polymyxin b (POLYTRIM) ophthalmic solution Place 1 drop into both eyes every 4 (four) hours.  . Vitamin D, Ergocalciferol, (DRISDOL) 1.25 MG (50000 UNIT) CAPS capsule Take 1 capsule (50,000 Units total) by mouth every 7 (seven) days.  . [DISCONTINUED] amLODipine (NORVASC) 10 MG tablet Take 1 tablet (10 mg total) by mouth daily.  . [DISCONTINUED] furosemide (LASIX) 20 MG tablet Take 1 tablet (20 mg total) by mouth daily.  . [DISCONTINUED] Semaglutide (RYBELSUS) 3 MG TABS Take 3 mg by mouth daily.   No facility-administered encounter medications on file as of 04/18/2020.    Surgical History: Past Surgical History:  Procedure Laterality Date  . ABDOMINAL HYSTERECTOMY    . CARPAL TUNNEL RELEASE Bilateral   . COLONOSCOPY    . COLONOSCOPY WITH PROPOFOL N/A 02/23/2019   Procedure: COLONOSCOPY  WITH PROPOFOL;  Surgeon: Jonathon Bellows, MD;  Location: [facility] ENDOSCOPY;  Service: Gastroenterology;  Laterality: N/A;    Medical History: Past Medical History:  Diagnosis Date  . Asthma   . Hypertension     Family History: Family History  Problem Relation Age of Onset  . Breast cancer Mother 13  . Breast cancer Cousin   . Depression Sister   . Anxiety disorder Sister     Social History   Socioeconomic History  . Marital status: Married    Spouse name: Lanny Hurst  . Number of children: 2  . Years of education: Not on file  . Highest education level: Associate degree:  occupational, Hotel manager, or vocational program  Occupational History  . Not on file  Tobacco Use  . Smoking status: Never Smoker  . Smokeless tobacco: Never Used  Vaping Use  . Vaping Use: Never used  Substance and Sexual Activity  . Alcohol use: No  . Drug use: No  . Sexual activity: Yes  Other Topics Concern  . Not on file  Social History Narrative  . Not on file   Social Determinants of Health   Financial Resource Strain: Not on file  Food Insecurity: Not on file  Transportation Needs: Not on file  Physical Activity: Not on file  Stress: Not on file  Social Connections: Not on file  Intimate Partner Violence: Not on file      Review of Systems  Constitutional: Negative for chills, fatigue and unexpected weight change.  HENT: Negative for congestion, postnasal drip, rhinorrhea, sneezing and sore throat.   Eyes: Negative for redness.  Respiratory: Negative for cough, chest tightness and shortness of breath.   Cardiovascular: Positive for leg swelling. Negative for chest pain and palpitations.  Gastrointestinal: Negative for abdominal pain, constipation, diarrhea, nausea and vomiting.  Genitourinary: Negative for dysuria and frequency.  Musculoskeletal: Positive for arthralgias and back pain. Negative for joint swelling and neck pain.  Skin: Negative for rash.  Neurological: Negative.  Negative for tremors and numbness.  Hematological: Negative for adenopathy. Does not bruise/bleed easily.  Psychiatric/Behavioral: Negative for behavioral problems (Depression), sleep disturbance and suicidal ideas. The patient is not nervous/anxious.     Vital Signs: BP (!) 163/86   Pulse 91   Temp 97.8 F (36.6 C)   Resp 16   Ht 5\' 4"  (1.626 m)   Wt (!) 310 lb (140.6 kg)   SpO2 96%   BMI 53.21 kg/m    Physical Exam Constitutional:      General: She is not in acute distress.    Appearance: She is well-developed. She is obese. She is not diaphoretic.  HENT:     Head:  Normocephalic and atraumatic.     Mouth/Throat:     Pharynx: No oropharyngeal exudate.  Eyes:     Pupils: Pupils are equal, round, and reactive to light.  Neck:     Thyroid: No thyromegaly.     Vascular: No JVD.     Trachea: No tracheal deviation.  Cardiovascular:     Rate and Rhythm: Normal rate and regular rhythm.     Heart sounds: Normal heart sounds. No murmur heard. No friction rub. No gallop.   Pulmonary:     Effort: Pulmonary effort is normal. No respiratory distress.     Breath sounds: No wheezing or rales.  Chest:     Chest wall: No tenderness.  Abdominal:     General: Bowel sounds are normal.     Palpations: Abdomen is soft.  Musculoskeletal:        General: Normal range of motion.     Cervical back: Normal range of motion and neck supple.  Lymphadenopathy:     Cervical: No cervical adenopathy.  Skin:    General: Skin is warm and dry.  Neurological:     Mental Status: She is alert and oriented to person, place, and time.     Cranial Nerves: No cranial nerve deficit.  Psychiatric:        Behavior: Behavior normal.        Thought Content: Thought content normal.        Judgment: Judgment normal.      Assessment/Plan: 1. Hypertension, unspecified type BP still uncontrolled. Will adjust medication by reducing Norvasc from 10 to 5mg  and double lasix to 40mg  due to LE edema. We will continue to monitor. - amLODipine (NORVASC) 5 MG tablet; Take 1 tablet (5 mg total) by mouth daily.  Dispense: 90 tablet; Refill: 1 - furosemide (LASIX) 40 MG tablet; Take 1 tablet (40 mg total) by mouth daily.  Dispense: 90 tablet; Refill: 1  2. Lower extremity edema Pt has bilateral lower extremity edema that causes discomfort. Her wt also went up 2 pounds in 2 weeks and her BP is high. Will double lasix to pull extra fluid off. - furosemide (LASIX) 40 MG tablet; Take 1 tablet (40 mg total) by mouth daily.  Dispense: 90 tablet; Refill: 1  3. BMI 50.0-59.9, adult ([place]) Pt understands  the negative consequences of obesity and is ready to make changes. She is going to work on her diet and is now in PT to help with back and knee problems so that she can hopefully become more active. We will double her rybelsus to 7mg  and follow up in 4 weeks. - Semaglutide (RYBELSUS) 7 MG TABS; Take 7 mg by mouth daily.  Dispense: 30 tablet; Refill: 2  4. Chronic right-sided low back pain with right-sided sciatica Was referred to ortho. Currently enrolled in PT.  5. Acute pain of both knees Was referred to ortho. Currently enrolled in PT.  General Counseling: sanye ledesma understanding of the findings of todays visit and agrees with plan of treatment. I have discussed any further diagnostic evaluation that may be needed or ordered today. We also reviewed her medications today. she has been encouraged to call the office with any questions or concerns that should arise related to todays visit.     Meds ordered this encounter  Medications  . Semaglutide (RYBELSUS) 7 MG TABS    Sig: Take 7 mg by mouth daily.    Dispense:  30 tablet    Refill:  2  . amLODipine (NORVASC) 5 MG tablet    Sig: Take 1 tablet (5 mg total) by mouth daily.    Dispense:  90 tablet    Refill:  1  . furosemide (LASIX) 40 MG tablet    Sig: Take 1 tablet (40 mg total) by mouth daily.    Dispense:  90 tablet    Refill:  1    Total time spent: 30 Minutes Time spent includes review of chart, medications, test results, and follow up plan with the patient.      Dr Lavera Guise Internal medicine

## 2020-04-20 MED ORDER — RYBELSUS 7 MG PO TABS: ORAL_TABLET | ORAL | 2 refills | Status: DC

## 2020-04-20 MED ORDER — AMLODIPINE BESYLATE 5 MG PO TABS: 5.0000 mg | ORAL_TABLET | Freq: Every day | ORAL | 1 refills | Status: DC

## 2020-04-20 MED ORDER — FUROSEMIDE 40 MG PO TABS: 40.0000 mg | ORAL_TABLET | Freq: Every day | ORAL | 1 refills | Status: DC

## 2020-04-22 ENCOUNTER — Other Ambulatory Visit: Payer: Self-pay | Admitting: Hospice and Palliative Medicine

## 2020-04-22 DIAGNOSIS — G8929 Other chronic pain: Secondary | ICD-10-CM

## 2020-05-16 ENCOUNTER — Other Ambulatory Visit: Payer: Self-pay

## 2020-05-16 ENCOUNTER — Ambulatory Visit: Payer: Managed Care, Other (non HMO) | Admitting: Physician Assistant

## 2020-05-16 ENCOUNTER — Encounter: Payer: Self-pay | Admitting: Physician Assistant

## 2020-05-16 DIAGNOSIS — I10 Essential (primary) hypertension: Secondary | ICD-10-CM | POA: Diagnosis not present

## 2020-05-16 DIAGNOSIS — M5441 Lumbago with sciatica, right side: Secondary | ICD-10-CM

## 2020-05-16 DIAGNOSIS — Z6841 Body Mass Index (BMI) 40.0 and over, adult: Secondary | ICD-10-CM

## 2020-05-16 DIAGNOSIS — R600 Localized edema: Secondary | ICD-10-CM

## 2020-05-16 DIAGNOSIS — M25561 Pain in right knee: Secondary | ICD-10-CM

## 2020-05-16 DIAGNOSIS — M25562 Pain in left knee: Secondary | ICD-10-CM

## 2020-05-16 DIAGNOSIS — G8929 Other chronic pain: Secondary | ICD-10-CM

## 2020-05-16 NOTE — Progress Notes (Signed)
[facility] [place], [place] [address]  Internal MEDICINE  Office Visit Note  Patient Name: April George  [id number]  [id number]  Date of Service: 05/17/2020  Chief Complaint  Patient presents with  . Hypertension    4 week f-up  . Weight Loss    HPI Pt returns today for f/u on HTN and wt loss. -her legs are much better since being on the 40mg  lasix. Pain and swelling has improved and pt is very happy. -Chronic R low back pain with R sciatica was seen by ortho. Worried about falling when it acts up. Walking is very difficult when it flares up. It is a little better since learning exercises. She stopped gabapentin bc she was taking it at wrong time (took in AM). -She was also seen for knee pain bilaterally and was given mobic for 1 month which has helped her pain. She is also seeing, and sees ortho again tomorrow. She does not want surgery. -She has lost 9 lbs since last visit. She is doing well on rybelsus and wants to continue. -BP around 130/80 at home. Has been much better since increasing the lasix to 40mg  and dropping norvasc to 5mg . Discussed we need to check a BMP on her since she is on higher dose of the lasix. Will consider changing this next visit for alt diuretic. -Asthma is well controlled, she only uses a rescue inhaler as needed. She has wheezing that happens occasionally when not doing anything which is new for her. It has happened maybe 3x since last visit. Thinks it has been allergy/weather fluctuations.  She will try taking zyrtec/ claritin. 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  Current Medication: Outpatient Encounter Medications as of 05/16/2020  Medication Sig  . albuterol (VENTOLIN HFA) 108 (90 Base) MCG/ACT inhaler Inhale 2 puffs into the lungs every 6 (six) hours as needed for wheezing or shortness of breath.  Marland Kitchen amLODipine (NORVASC) 5  MG tablet Take 1 tablet (5 mg total) by mouth daily.  Marland Kitchen buPROPion (WELLBUTRIN) 100 MG tablet Take 1 tablet (100 mg total) by mouth daily.  . celecoxib (CELEBREX) 50 MG capsule Take 1 capsule (50 mg total) by mouth 2 (two) times daily.  Marland Kitchen desloratadine (CLARINEX REDITAB) 5 MG disintegrating tablet Take 1 tablet (5 mg total) by mouth daily as needed.  . ergocalciferol (VITAMIN D2) 1.25 MG (50000 UT) capsule Vitamin D2 1,250 mcg (50,000 unit) capsule  . fluticasone (FLONASE) 50 MCG/ACT nasal spray Place 2 sprays into both nostrils daily.  . fluticasone (FLONASE) 50 MCG/ACT nasal spray Place into the nose.  . furosemide (LASIX) 40 MG tablet Take 1 tablet (40 mg total) by mouth daily.  Marland Kitchen HYDROcodone-acetaminophen (NORCO/VICODIN) 5-325 MG tablet Take 1 tablet by mouth 2 (two) times daily as needed for moderate pain.  . hydrOXYzine (ATARAX/VISTARIL) 10 MG tablet Take 1-2 tablets (10-20 mg total) by mouth 2 (two) times daily as needed. For severe anxiety  . lisinopril (ZESTRIL) 20 MG tablet Take 2 tablets (40 mg total) by mouth daily.  . meloxicam (MOBIC) 7.5 MG tablet Take 7.5 mg by mouth daily.  . Olopatadine HCl 0.2 % SOLN Apply 1 drop to eye daily.  . Semaglutide (RYBELSUS) 7 MG TABS Take 7 mg by mouth daily.  Marland Kitchen tretinoin (RETIN-A) 0.025 % cream Apply topically at bedtime.  Marland Kitchen trimethoprim-polymyxin b (POLYTRIM) ophthalmic solution Place 1 drop into  both eyes every 4 (four) hours.  . Vitamin D, Ergocalciferol, (DRISDOL) 1.25 MG (50000 UNIT) CAPS capsule Take 1 capsule (50,000 Units total) by mouth every 7 (seven) days.  Marland Kitchen gabapentin (NEURONTIN) 100 MG capsule TAKE 1 CAPSULE BY MOUTH THREE TIMES DAILY (Patient not taking: Reported on 05/16/2020)  . phentermine (ADIPEX-P) 37.5 MG tablet Take 1 tablet (37.5 mg total) by mouth daily before breakfast. (Patient not taking: Reported on 05/16/2020)   No facility-administered encounter medications on file as of 05/16/2020.    Surgical History: Past Surgical  History:  Procedure Laterality Date  . ABDOMINAL HYSTERECTOMY    . CARPAL TUNNEL RELEASE Bilateral   . COLONOSCOPY    . COLONOSCOPY WITH PROPOFOL N/A 02/23/2019   Procedure: COLONOSCOPY WITH PROPOFOL;  Surgeon: Jonathon Bellows, MD;  Location: [facility] ENDOSCOPY;  Service: Gastroenterology;  Laterality: N/A;    Medical History: Past Medical History:  Diagnosis Date  . Asthma   . Hypertension     Family History: Family History  Problem Relation Age of Onset  . Breast cancer Mother 54  . Breast cancer Cousin   . Depression Sister   . Anxiety disorder Sister     Social History   Socioeconomic History  . Marital status: Married    Spouse name: Lanny Hurst  . Number of children: 2  . Years of education: Not on file  . Highest education level: Associate degree: occupational, Hotel manager, or vocational program  Occupational History  . Not on file  Tobacco Use  . Smoking status: Never Smoker  . Smokeless tobacco: Never Used  Vaping Use  . Vaping Use: Never used  Substance and Sexual Activity  . Alcohol use: No  . Drug use: No  . Sexual activity: Yes  Other Topics Concern  . Not on file  Social History Narrative  . Not on file   Social Determinants of Health   Financial Resource Strain: Not on file  Food Insecurity: Not on file  Transportation Needs: Not on file  Physical Activity: Not on file  Stress: Not on file  Social Connections: Not on file  Intimate Partner Violence: Not on file      Review of Systems  Constitutional: Negative for chills, fatigue and unexpected weight change.  HENT: Positive for postnasal drip. Negative for congestion, rhinorrhea, sneezing and sore throat.   Eyes: Negative for redness.  Respiratory: Negative for cough, chest tightness and shortness of breath.   Cardiovascular: Negative for chest pain and palpitations.  Gastrointestinal: Negative for abdominal pain, constipation, diarrhea, nausea and vomiting.  Genitourinary: Negative for dysuria and  frequency.  Musculoskeletal: Positive for arthralgias and back pain. Negative for neck pain.  Skin: Negative for rash.  Neurological: Negative.  Negative for tremors, numbness and headaches.  Hematological: Negative for adenopathy. Does not bruise/bleed easily.  Psychiatric/Behavioral: Negative for behavioral problems (Depression), sleep disturbance and suicidal ideas. The patient is not nervous/anxious.     Vital Signs: BP 124/80   Pulse 86   Temp 98 F (36.7 C)   Resp 16   Ht 5\' 7"  (1.702 m)   Wt (!) 301 lb 9.6 oz (136.8 kg)   SpO2 96%   BMI 47.24 kg/m    Physical Exam Constitutional:      General: She is not in acute distress.    Appearance: She is well-developed. She is obese. She is not diaphoretic.  HENT:     Head: Normocephalic and atraumatic.     Mouth/Throat:     Pharynx: No oropharyngeal exudate.  Eyes:     Pupils: Pupils are equal, round, and reactive to light.  Neck:     Thyroid: No thyromegaly.     Vascular: No JVD.     Trachea: No tracheal deviation.  Cardiovascular:     Rate and Rhythm: Normal rate and regular rhythm.     Heart sounds: Normal heart sounds. No murmur heard. No friction rub. No gallop.   Pulmonary:     Effort: Pulmonary effort is normal. No respiratory distress.     Breath sounds: No wheezing or rales.  Chest:     Chest wall: No tenderness.  Abdominal:     General: Bowel sounds are normal.     Palpations: Abdomen is soft.  Musculoskeletal:        General: Tenderness present. Normal range of motion.     Cervical back: Normal range of motion and neck supple.     Comments: LE edema is much improved today. Pt continue to have bilateral knee pain and low back pain with R sided sciatica that is worse with movement  Lymphadenopathy:     Cervical: No cervical adenopathy.  Skin:    General: Skin is warm and dry.  Neurological:     Mental Status: She is alert and oriented to person, place, and time.     Cranial Nerves: No cranial nerve  deficit.  Psychiatric:        Behavior: Behavior normal.        Thought Content: Thought content normal.        Judgment: Judgment normal.        Assessment/Plan: 1. Hypertension, unspecified type Much improved. Continue on 5mg  Norvasc and 40mg  lasix. Will check a BMP and consider changing lasix to alternative like triamterene-HCTZ. Continue to monitor BP at home.  2. Lower extremity edema Much improved since decreasing norvasc and increasing lasix. Will continue to monitor and adjust lasix as able.  3. Acute pain of both knees Followed by ortho and PT. Currently using mobic daily.  4. Chronic right-sided low back pain with right-sided sciatica Followed by ortho and PT.  5. BMI 45.0-49.9, adult (HCC) Has lost 9lbs since last visit. Doing well on rybelsus. Will continue on this and continue to eat better, restrict calories, and exercise as able.   General Counseling: tyrah broers understanding of the findings of todays visit and agrees with plan of treatment. I have discussed any further diagnostic evaluation that may be needed or ordered today. We also reviewed her medications today. she has been encouraged to call the office with any questions or concerns that should arise related to todays visit.    No orders of the defined types were placed in this encounter.   No orders of the defined types were placed in this encounter.   This patient was seen by Drema Dallas, PA-C in collaboration with Dr. Clayborn Bigness as a part of collaborative care agreement.   Total time spent:30 Minutes Time spent includes review of chart, medications, test results, and follow up plan with the patient.      Dr Lavera Guise Internal medicine

## 2020-06-11 LAB — BASIC METABOLIC PANEL: Glucose: 94

## 2020-06-12 LAB — BASIC METABOLIC PANEL
BUN/Creatinine Ratio: 17 (ref 9–23)
BUN: 19 mg/dL (ref 6–24)
CO2: 22 mmol/L (ref 20–29)
Calcium: 9.4 mg/dL (ref 8.7–10.2)
Chloride: 102 mmol/L (ref 96–106)
Creatinine, Ser: 1.15 mg/dL — ABNORMAL HIGH (ref 0.57–1.00)
Glucose: 94 mg/dL (ref 65–99)
Potassium: 4.3 mmol/L (ref 3.5–5.2)
Sodium: 143 mmol/L (ref 134–144)
eGFR: 55 mL/min/{1.73_m2} — ABNORMAL LOW (ref >59)

## 2020-06-12 NOTE — Progress Notes (Signed)
I have reviewed all the lab results. There are some abnormalities that are not critical to the patient's health, but I would like to discuss these in person at an office on next follow up

## 2020-06-13 ENCOUNTER — Ambulatory Visit (INDEPENDENT_AMBULATORY_CARE_PROVIDER_SITE_OTHER): Payer: Managed Care, Other (non HMO) | Admitting: Physician Assistant

## 2020-06-13 ENCOUNTER — Other Ambulatory Visit: Payer: Self-pay

## 2020-06-13 ENCOUNTER — Encounter: Payer: Self-pay | Admitting: Physician Assistant

## 2020-06-13 DIAGNOSIS — R600 Localized edema: Secondary | ICD-10-CM

## 2020-06-13 DIAGNOSIS — I10 Essential (primary) hypertension: Secondary | ICD-10-CM | POA: Diagnosis not present

## 2020-06-13 DIAGNOSIS — N289 Disorder of kidney and ureter, unspecified: Secondary | ICD-10-CM | POA: Diagnosis not present

## 2020-06-13 DIAGNOSIS — Z6841 Body Mass Index (BMI) 40.0 and over, adult: Secondary | ICD-10-CM

## 2020-06-13 NOTE — Progress Notes (Signed)
[facility] [place], [place] [address]  Internal MEDICINE  Office Visit Note  Patient Name: April George  [id number]  [id number]  Date of Service: 06/16/2020  Chief Complaint  Patient presents with  . Follow-up  . Hypertension  . Asthma    HPI Pt is here for f/u. -Pt is doing well on rybelsus. She has lost 4lbs since last visit. -She has been doing 40mg  of lasix which helped with BP and LE edema has resovled. Will need to consider switch in diuretics based on creatinine rising on labs--will need close monitoring -BP at home-120/80 on 40mg  lasix, 5mg  norvasc and 20mg  lisinopril  Current Medication: Outpatient Encounter Medications as of 06/13/2020  Medication Sig  . albuterol (VENTOLIN HFA) 108 (90 Base) MCG/ACT inhaler Inhale 2 puffs into the lungs every 6 (six) hours as needed for wheezing or shortness of breath.  Marland Kitchen amLODipine (NORVASC) 5 MG tablet Take 1 tablet (5 mg total) by mouth daily.  Marland Kitchen buPROPion (WELLBUTRIN) 100 MG tablet Take 1 tablet (100 mg total) by mouth daily.  . celecoxib (CELEBREX) 50 MG capsule Take 1 capsule (50 mg total) by mouth 2 (two) times daily.  Marland Kitchen desloratadine (CLARINEX REDITAB) 5 MG disintegrating tablet Take 1 tablet (5 mg total) by mouth daily as needed.  . fluticasone (FLONASE) 50 MCG/ACT nasal spray Place 2 sprays into both nostrils daily.  . furosemide (LASIX) 40 MG tablet Take 1 tablet (40 mg total) by mouth daily.  Marland Kitchen gabapentin (NEURONTIN) 100 MG capsule TAKE 1 CAPSULE BY MOUTH THREE TIMES DAILY  . HYDROcodone-acetaminophen (NORCO/VICODIN) 5-325 MG tablet Take 1 tablet by mouth 2 (two) times daily as needed for moderate pain.  . hydrOXYzine (ATARAX/VISTARIL) 10 MG tablet Take 1-2 tablets (10-20 mg total) by mouth 2 (two) times daily as needed. For severe anxiety  . lisinopril (ZESTRIL) 20 MG tablet Take 2 tablets (40 mg total) by mouth daily.  . meloxicam (MOBIC) 7.5 MG tablet Take 7.5 mg by mouth daily.  . phentermine  (ADIPEX-P) 37.5 MG tablet Take 1 tablet (37.5 mg total) by mouth daily before breakfast.  . Semaglutide (RYBELSUS) 7 MG TABS Take 7 mg by mouth daily.  Marland Kitchen tretinoin (RETIN-A) 0.025 % cream Apply topically at bedtime.  . [DISCONTINUED] ergocalciferol (VITAMIN D2) 1.25 MG (50000 UT) capsule Vitamin D2 1,250 mcg (50,000 unit) capsule (Patient not taking: Reported on 06/13/2020)  . [DISCONTINUED] fluticasone (FLONASE) 50 MCG/ACT nasal spray Place into the nose.  . [DISCONTINUED] Olopatadine HCl 0.2 % SOLN Apply 1 drop to eye daily. (Patient not taking: Reported on 06/13/2020)  . [DISCONTINUED] trimethoprim-polymyxin b (POLYTRIM) ophthalmic solution Place 1 drop into both eyes every 4 (four) hours. (Patient not taking: Reported on 06/13/2020)  . [DISCONTINUED] Vitamin D, Ergocalciferol, (DRISDOL) 1.25 MG (50000 UNIT) CAPS capsule Take 1 capsule (50,000 Units total) by mouth every 7 (seven) days. (Patient not taking: Reported on 06/13/2020)   No facility-administered encounter medications on file as of 06/13/2020.    Surgical History: Past Surgical History:  Procedure Laterality Date  . ABDOMINAL HYSTERECTOMY    . CARPAL TUNNEL RELEASE Bilateral   . COLONOSCOPY    . COLONOSCOPY WITH PROPOFOL N/A 02/23/2019   Procedure: COLONOSCOPY WITH PROPOFOL;  Surgeon: Jonathon Bellows, MD;  Location: [facility] ENDOSCOPY;  Service: Gastroenterology;  Laterality: N/A;    Medical History: Past Medical History:  Diagnosis Date  . Asthma   . Hypertension     Family History: Family History  Problem Relation Age of Onset  .  Breast cancer Mother 3  . Breast cancer Cousin   . Depression Sister   . Anxiety disorder Sister     Social History   Socioeconomic History  . Marital status: Married    Spouse name: Lanny Hurst  . Number of children: 2  . Years of education: Not on file  . Highest education level: Associate degree: occupational, Hotel manager, or vocational program  Occupational History  . Not on file  Tobacco Use   . Smoking status: Never Smoker  . Smokeless tobacco: Never Used  Vaping Use  . Vaping Use: Never used  Substance and Sexual Activity  . Alcohol use: No  . Drug use: No  . Sexual activity: Yes  Other Topics Concern  . Not on file  Social History Narrative  . Not on file   Social Determinants of Health   Financial Resource Strain: Not on file  Food Insecurity: Not on file  Transportation Needs: Not on file  Physical Activity: Not on file  Stress: Not on file  Social Connections: Not on file  Intimate Partner Violence: Not on file      Review of Systems  Constitutional: Negative for chills, diaphoresis and fatigue.  HENT: Negative for ear pain, postnasal drip and sinus pressure.   Eyes: Negative for photophobia, discharge, redness, itching and visual disturbance.  Respiratory: Negative for cough, shortness of breath and wheezing.   Cardiovascular: Negative for chest pain, palpitations and leg swelling.  Gastrointestinal: Negative for abdominal pain, constipation, diarrhea, nausea and vomiting.  Genitourinary: Negative for dysuria and flank pain.  Musculoskeletal: Positive for arthralgias and back pain. Negative for gait problem and neck pain.  Skin: Negative for color change.  Allergic/Immunologic: Negative for environmental allergies and food allergies.  Neurological: Negative for dizziness and headaches.  Hematological: Does not bruise/bleed easily.  Psychiatric/Behavioral: Negative for agitation, behavioral problems (depression) and hallucinations.    Vital Signs: BP 118/86   Pulse 80   Temp (!) 97.2 F (36.2 C)   Resp 16   Ht 5\' 7"  (1.702 m)   Wt 297 lb 12.8 oz (135.1 kg)   SpO2 97%   BMI 46.64 kg/m    Physical Exam Vitals and nursing note reviewed.  Constitutional:      General: She is not in acute distress.    Appearance: She is well-developed. She is obese. She is not diaphoretic.  HENT:     Head: Normocephalic and atraumatic.     Mouth/Throat:      Pharynx: No oropharyngeal exudate.  Eyes:     Pupils: Pupils are equal, round, and reactive to light.  Neck:     Thyroid: No thyromegaly.     Vascular: No JVD.     Trachea: No tracheal deviation.  Cardiovascular:     Rate and Rhythm: Normal rate and regular rhythm.     Heart sounds: Normal heart sounds. No murmur heard. No friction rub. No gallop.   Pulmonary:     Effort: Pulmonary effort is normal. No respiratory distress.     Breath sounds: No wheezing or rales.  Chest:     Chest wall: No tenderness.  Abdominal:     General: Bowel sounds are normal.     Palpations: Abdomen is soft.  Musculoskeletal:        General: Normal range of motion.     Cervical back: Normal range of motion and neck supple.  Lymphadenopathy:     Cervical: No cervical adenopathy.  Skin:    General: Skin is warm  and dry.  Neurological:     Mental Status: She is alert and oriented to person, place, and time.     Cranial Nerves: No cranial nerve deficit.  Psychiatric:        Behavior: Behavior normal.        Thought Content: Thought content normal.        Judgment: Judgment normal.        Assessment/Plan: 1. Hypertension, unspecified type Pt will continue current management on lasix, lisinopril and norvasc but will need close monitoring of renal function while on lasix. May need alternative diuretic in future.  2. Lower extremity edema Continue lasix, but may need alt diuretic if renal function continues to worsen.  3. Abnormal renal function Continue to monitor, likely secondary to increased lasix.  4. BMI 45.0-49.9, adult ([place]) Continue rybelsus daily and continue to work on diet and exercise.  General Counseling: ronda rajkumar understanding of the findings of todays visit and agrees with plan of treatment. I have discussed any further diagnostic evaluation that may be needed or ordered today. We also reviewed her medications today. she has been encouraged to call the office with any  questions or concerns that should arise related to todays visit.    No orders of the defined types were placed in this encounter.   No orders of the defined types were placed in this encounter.   This patient was seen by Drema Dallas, PA-C in collaboration with Dr. Clayborn Bigness as a part of collaborative care agreement.   Total time spent:30 Minutes Time spent includes review of chart, medications, test results, and follow up plan with the patient.      Dr Lavera Guise Internal medicine

## 2020-07-04 ENCOUNTER — Ambulatory Visit: Payer: Managed Care, Other (non HMO) | Admitting: Physician Assistant

## 2020-07-04 ENCOUNTER — Other Ambulatory Visit: Payer: Self-pay

## 2020-07-04 DIAGNOSIS — Z0271 Encounter for disability determination: Secondary | ICD-10-CM

## 2020-07-04 DIAGNOSIS — I10 Essential (primary) hypertension: Secondary | ICD-10-CM

## 2020-07-04 NOTE — Progress Notes (Signed)
[facility] [place], [place] [address]  Internal MEDICINE  Office Visit Note  Patient Name: April George  [id number]  [id number]  Date of Service: 07/08/2020  Chief Complaint  Patient presents with  . Follow-up    FMLA paperwork, itchy eyes  . Hypertension  . Asthma    HPI Pt is here for FMLA paperwork due to husband's illness. -Husband is in the hospital currently. Thursday took him to [facility] and got a stent placed for a kidney [place]. Saw WBC elevated and feared infection. Couldn't do a laser due to infection risk. Started urinating every 20 minutes and had incontinence. On Tuesday called his physician bc of this and was walking with walker bc so bad. Called in meds for him. He was very tired and weak but no fever. Yesterday barely could walk with walker. Temp reached 105 last night and by the time they could get him to [place] it was 107. They found blood clot in lungs and has an infection with chills without fever now. HJe started having chest pain and got admitted today.  -Pt called FMLA so she could be free to help him with total care needs. Was given initially a week from procedure recovery, but then told they extended the cap to September potentially. -Needs paperwork to be turned in on Monday. -Pt herself has improved BP today and is currently doing 20 mg lasix and norvasc 5mg   Current Medication: Outpatient Encounter Medications as of 07/04/2020  Medication Sig  . albuterol (VENTOLIN HFA) 108 (90 Base) MCG/ACT inhaler Inhale 2 puffs into the lungs every 6 (six) hours as needed for wheezing or shortness of breath.  Marland Kitchen amLODipine (NORVASC) 5 MG tablet Take 1 tablet (5 mg total) by mouth daily.  Marland Kitchen buPROPion (WELLBUTRIN) 100 MG tablet Take 1 tablet (100 mg total) by mouth daily.  . celecoxib (CELEBREX) 50 MG capsule Take 1 capsule (50 mg total) by mouth 2 (two) times daily.  Marland Kitchen desloratadine (CLARINEX REDITAB) 5 MG disintegrating tablet Take 1 tablet (5 mg  total) by mouth daily as needed.  . fluticasone (FLONASE) 50 MCG/ACT nasal spray Place 2 sprays into both nostrils daily.  . furosemide (LASIX) 40 MG tablet Take 1 tablet (40 mg total) by mouth daily.  Marland Kitchen gabapentin (NEURONTIN) 100 MG capsule TAKE 1 CAPSULE BY MOUTH THREE TIMES DAILY  . HYDROcodone-acetaminophen (NORCO/VICODIN) 5-325 MG tablet Take 1 tablet by mouth 2 (two) times daily as needed for moderate pain.  . hydrOXYzine (ATARAX/VISTARIL) 10 MG tablet Take 1-2 tablets (10-20 mg total) by mouth 2 (two) times daily as needed. For severe anxiety  . lisinopril (ZESTRIL) 20 MG tablet Take 2 tablets (40 mg total) by mouth daily.  . meloxicam (MOBIC) 7.5 MG tablet Take 7.5 mg by mouth daily.  . phentermine (ADIPEX-P) 37.5 MG tablet Take 1 tablet (37.5 mg total) by mouth daily before breakfast.  . Semaglutide (RYBELSUS) 7 MG TABS Take 7 mg by mouth daily.  Marland Kitchen tretinoin (RETIN-A) 0.025 % cream Apply topically at bedtime.   No facility-administered encounter medications on file as of 07/04/2020.    Surgical History: Past Surgical History:  Procedure Laterality Date  . ABDOMINAL HYSTERECTOMY    . CARPAL TUNNEL RELEASE Bilateral   . COLONOSCOPY    . COLONOSCOPY WITH PROPOFOL N/A 02/23/2019   Procedure: COLONOSCOPY WITH PROPOFOL;  Surgeon: Jonathon Bellows, MD;  Location: [facility] ENDOSCOPY;  Service: Gastroenterology;  Laterality: N/A;    Medical History: Past Medical History:  Diagnosis  Date  . Asthma   . Hypertension     Family History: Family History  Problem Relation Age of Onset  . Breast cancer Mother 33  . Breast cancer Cousin   . Depression Sister   . Anxiety disorder Sister     Social History   Socioeconomic History  . Marital status: Married    Spouse name: Lanny Hurst  . Number of children: 2  . Years of education: Not on file  . Highest education level: Associate degree: occupational, Hotel manager, or vocational program  Occupational History  . Not on file  Tobacco Use  .  Smoking status: Never Smoker  . Smokeless tobacco: Never Used  Vaping Use  . Vaping Use: Never used  Substance and Sexual Activity  . Alcohol use: No  . Drug use: No  . Sexual activity: Yes  Other Topics Concern  . Not on file  Social History Narrative  . Not on file   Social Determinants of Health   Financial Resource Strain: Not on file  Food Insecurity: Not on file  Transportation Needs: Not on file  Physical Activity: Not on file  Stress: Not on file  Social Connections: Not on file  Intimate Partner Violence: Not on file      Review of Systems  Constitutional: Negative for chills, diaphoresis and fatigue.  HENT: Negative for ear pain, postnasal drip and sinus pressure.   Eyes: Negative for photophobia, discharge, redness, itching and visual disturbance.  Respiratory: Negative for cough, shortness of breath and wheezing.   Cardiovascular: Negative for chest pain, palpitations and leg swelling.  Gastrointestinal: Negative for abdominal pain, constipation, diarrhea, nausea and vomiting.  Genitourinary: Negative for dysuria and flank pain.  Musculoskeletal: Positive for arthralgias. Negative for back pain, gait problem and neck pain.  Skin: Negative for color change.  Allergic/Immunologic: Negative for environmental allergies and food allergies.  Neurological: Negative for dizziness and headaches.  Hematological: Does not bruise/bleed easily.  Psychiatric/Behavioral: Negative for agitation, behavioral problems (depression) and hallucinations. The patient is nervous/anxious.     Vital Signs: BP 118/62   Pulse 90   Temp (!) 97 F (36.1 C)   Resp 16   Ht 5\' 6"  (1.676 m)   Wt 297 lb (134.7 kg)   SpO2 98%   BMI 47.94 kg/m    Physical Exam Vitals and nursing note reviewed.  Constitutional:      General: She is not in acute distress.    Appearance: She is well-developed. She is obese. She is not diaphoretic.  HENT:     Head: Normocephalic and atraumatic.      Mouth/Throat:     Pharynx: No oropharyngeal exudate.  Eyes:     Pupils: Pupils are equal, round, and reactive to light.  Neck:     Thyroid: No thyromegaly.     Vascular: No JVD.     Trachea: No tracheal deviation.  Cardiovascular:     Rate and Rhythm: Normal rate and regular rhythm.     Heart sounds: Normal heart sounds. No murmur heard. No friction rub. No gallop.   Pulmonary:     Effort: Pulmonary effort is normal. No respiratory distress.     Breath sounds: No wheezing or rales.  Chest:     Chest wall: No tenderness.  Abdominal:     General: Bowel sounds are normal.     Palpations: Abdomen is soft.  Musculoskeletal:        General: Normal range of motion.     Cervical back:  Normal range of motion and neck supple.  Lymphadenopathy:     Cervical: No cervical adenopathy.  Skin:    General: Skin is warm and dry.  Neurological:     Mental Status: She is alert and oriented to person, place, and time.     Cranial Nerves: No cranial nerve deficit.  Psychiatric:        Behavior: Behavior normal.        Thought Content: Thought content normal.        Judgment: Judgment normal.     Comments: Pt is worried over husband        Assessment/Plan: 1. Encounter for disability determination FMLA paperwork filled out for patient to provide care for sick husband.  2. Essential hypertension BP well controlled today, continue current medications   General Counseling: Adalee verbalizes understanding of the findings of todays visit and agrees with plan of treatment. I have discussed any further diagnostic evaluation that may be needed or ordered today. We also reviewed her medications today. she has been encouraged to call the office with any questions or concerns that should arise related to todays visit.    No orders of the defined types were placed in this encounter.   No orders of the defined types were placed in this encounter.   This patient was seen by Drema Dallas, PA-C  in collaboration with Dr. Clayborn Bigness as a part of collaborative care agreement.   Total time spent:30 Minutes Time spent includes review of chart, medications, test results, and follow up plan with the patient.      Dr Lavera Guise Internal medicine

## 2020-07-08 ENCOUNTER — Telehealth: Payer: Self-pay

## 2020-07-08 NOTE — Telephone Encounter (Signed)
Faxed over FMLA paperwork to [name] at [phone]. Placed at front desk for pickup. Patient advised. [name]

## 2020-07-09 ENCOUNTER — Telehealth: Payer: Self-pay

## 2020-07-09 NOTE — Telephone Encounter (Signed)
Copy of FMLA paperwork placed in scan

## 2020-07-11 ENCOUNTER — Other Ambulatory Visit: Payer: Self-pay

## 2020-07-11 ENCOUNTER — Encounter: Payer: Self-pay | Admitting: Physician Assistant

## 2020-07-11 ENCOUNTER — Ambulatory Visit: Payer: Managed Care, Other (non HMO) | Admitting: Physician Assistant

## 2020-07-11 DIAGNOSIS — N289 Disorder of kidney and ureter, unspecified: Secondary | ICD-10-CM

## 2020-07-11 DIAGNOSIS — I10 Essential (primary) hypertension: Secondary | ICD-10-CM | POA: Diagnosis not present

## 2020-07-11 DIAGNOSIS — H1013 Acute atopic conjunctivitis, bilateral: Secondary | ICD-10-CM

## 2020-07-11 DIAGNOSIS — Z6841 Body Mass Index (BMI) 40.0 and over, adult: Secondary | ICD-10-CM

## 2020-07-11 NOTE — Progress Notes (Signed)
[facility] [place], [place] [address]  Internal MEDICINE  Office Visit Note  Patient Name: April George  [id number]  [id number]  Date of Service: 07/14/2020  Chief Complaint  Patient presents with  . Follow-up  . Hypertension  . Asthma    HPI  Pt is here for follow up. -Pt does mention she needs FMLA paperwork adjusted because needs specifics. -Pt is doing well. She is tired and stressed with husband's illness. Lots of unknowns right now. -Eyes have been very itchy, zyrtec helps. Recommend systane and allergy drops.  -Doing 2 tablets of lisinopril and half tab lasix currently and BP has been stable. Will continue at this dose and monitor closely. -She is taking OTC vitamin D -With all the recent stress and husband's illness she has not been good about herr diet/exercicse lately and thinks she has gained--2lbs in the last month based on office notes. She is going to try to get back on track with this now  -Doing half tablet Current Medication: Outpatient Encounter Medications as of 07/11/2020  Medication Sig  . albuterol (VENTOLIN HFA) 108 (90 Base) MCG/ACT inhaler Inhale 2 puffs into the lungs every 6 (six) hours as needed for wheezing or shortness of breath.  Marland Kitchen amLODipine (NORVASC) 5 MG tablet Take 1 tablet (5 mg total) by mouth daily.  Marland Kitchen buPROPion (WELLBUTRIN) 100 MG tablet Take 1 tablet (100 mg total) by mouth daily.  . celecoxib (CELEBREX) 50 MG capsule Take 1 capsule (50 mg total) by mouth 2 (two) times daily.  Marland Kitchen desloratadine (CLARINEX REDITAB) 5 MG disintegrating tablet Take 1 tablet (5 mg total) by mouth daily as needed.  . fluticasone (FLONASE) 50 MCG/ACT nasal spray Place 2 sprays into both nostrils daily.  . furosemide (LASIX) 40 MG tablet Take 1 tablet (40 mg total) by mouth daily.  Marland Kitchen gabapentin (NEURONTIN) 100 MG capsule TAKE 1 CAPSULE BY MOUTH THREE TIMES DAILY  . HYDROcodone-acetaminophen (NORCO/VICODIN) 5-325 MG tablet Take 1 tablet by  mouth 2 (two) times daily as needed for moderate pain.  . hydrOXYzine (ATARAX/VISTARIL) 10 MG tablet Take 1-2 tablets (10-20 mg total) by mouth 2 (two) times daily as needed. For severe anxiety  . lisinopril (ZESTRIL) 20 MG tablet Take 2 tablets (40 mg total) by mouth daily.  . meloxicam (MOBIC) 7.5 MG tablet Take 7.5 mg by mouth daily.  . Semaglutide (RYBELSUS) 7 MG TABS Take 7 mg by mouth daily.  Marland Kitchen tretinoin (RETIN-A) 0.025 % cream Apply topically at bedtime.  . [DISCONTINUED] phentermine (ADIPEX-P) 37.5 MG tablet Take 1 tablet (37.5 mg total) by mouth daily before breakfast.   No facility-administered encounter medications on file as of 07/11/2020.    Surgical History: Past Surgical History:  Procedure Laterality Date  . ABDOMINAL HYSTERECTOMY    . CARPAL TUNNEL RELEASE Bilateral   . COLONOSCOPY    . COLONOSCOPY WITH PROPOFOL N/A 02/23/2019   Procedure: COLONOSCOPY WITH PROPOFOL;  Surgeon: Jonathon Bellows, MD;  Location: [facility] ENDOSCOPY;  Service: Gastroenterology;  Laterality: N/A;    Medical History: Past Medical History:  Diagnosis Date  . Asthma   . Hypertension     Family History: Family History  Problem Relation Age of Onset  . Breast cancer Mother 20  . Breast cancer Cousin   . Depression Sister   . Anxiety disorder Sister     Social History   Socioeconomic History  . Marital status: Married    Spouse name: Lanny Hurst  . Number of children: 2  .  Years of education: Not on file  . Highest education level: Associate degree: occupational, Hotel manager, or vocational program  Occupational History  . Not on file  Tobacco Use  . Smoking status: Never Smoker  . Smokeless tobacco: Never Used  Vaping Use  . Vaping Use: Never used  Substance and Sexual Activity  . Alcohol use: No  . Drug use: No  . Sexual activity: Yes  Other Topics Concern  . Not on file  Social History Narrative  . Not on file   Social Determinants of Health   Financial Resource Strain: Not on file   Food Insecurity: Not on file  Transportation Needs: Not on file  Physical Activity: Not on file  Stress: Not on file  Social Connections: Not on file  Intimate Partner Violence: Not on file      Review of Systems  Constitutional: Positive for fatigue. Negative for chills and unexpected weight change.  HENT: Negative for congestion, postnasal drip, rhinorrhea, sneezing and sore throat.   Eyes: Positive for itching. Negative for redness.  Respiratory: Negative for cough, chest tightness and shortness of breath.   Cardiovascular: Negative for chest pain and palpitations.  Gastrointestinal: Negative for abdominal pain, constipation, diarrhea, nausea and vomiting.  Genitourinary: Negative for dysuria and frequency.  Musculoskeletal: Positive for arthralgias. Negative for back pain, joint swelling and neck pain.  Skin: Negative for rash.  Neurological: Negative.  Negative for tremors and numbness.  Hematological: Negative for adenopathy. Does not bruise/bleed easily.  Psychiatric/Behavioral: Positive for dysphoric mood. Negative for behavioral problems (Depression), sleep disturbance and suicidal ideas. The patient is nervous/anxious.     Vital Signs: BP 140/68   Pulse 90   Temp (!) 97.3 F (36.3 C)   Resp 16   Ht 5\' 6"  (1.676 m)   Wt 299 lb (135.6 kg)   SpO2 97%   BMI 48.26 kg/m    Physical Exam Vitals and nursing note reviewed.  Constitutional:      General: She is not in acute distress.    Appearance: She is well-developed. She is obese. She is not diaphoretic.  HENT:     Head: Normocephalic and atraumatic.     Mouth/Throat:     Pharynx: No oropharyngeal exudate.  Eyes:     Pupils: Pupils are equal, round, and reactive to light.  Neck:     Thyroid: No thyromegaly.     Vascular: No JVD.     Trachea: No tracheal deviation.  Cardiovascular:     Rate and Rhythm: Normal rate and regular rhythm.     Heart sounds: Normal heart sounds. No murmur heard. No friction rub.  No gallop.   Pulmonary:     Effort: Pulmonary effort is normal. No respiratory distress.     Breath sounds: No wheezing or rales.  Chest:     Chest wall: No tenderness.  Abdominal:     General: Bowel sounds are normal.     Palpations: Abdomen is soft.  Musculoskeletal:        General: Normal range of motion.     Cervical back: Normal range of motion and neck supple.  Lymphadenopathy:     Cervical: No cervical adenopathy.  Skin:    General: Skin is warm and dry.  Neurological:     Mental Status: She is alert and oriented to person, place, and time.     Cranial Nerves: No cranial nerve deficit.  Psychiatric:        Behavior: Behavior normal.  Thought Content: Thought content normal.        Judgment: Judgment normal.        Assessment/Plan: 1. Essential hypertension Stable, continue lisinopril and 1/2 tab lasix--continue to monitor closely  2. Abnormal renal function Will recheck before next visit now that lasix has been reduced - Comprehensive metabolic panel  3. Allergic conjunctivitis of both eyes Continue zyrtec, recommend systane eyedrops as well as allergy drops for short term use  4. Class 3 severe obesity due to excess calories with serious comorbidity and body mass index (BMI) of 45.0 to 49.9 in adult [facility]) Continue rybelsus. Obesity Counseling: Had a lengthy discussion regarding patients BMI and weight issues. Patient was instructed on portion control as well as increased activity. Also discussed caloric restrictions with trying to maintain intake less than 2000 Kcal. Discussions were made in accordance with the 5As of weight management. Simple actions such as not eating late and if able to, taking a walk is suggested.    General Counseling: aisley whan understanding of the findings of todays visit and agrees with plan of treatment. I have discussed any further diagnostic evaluation that may be needed or ordered today. We also reviewed her medications  today. she has been encouraged to call the office with any questions or concerns that should arise related to todays visit.    Orders Placed This Encounter  Procedures  . Comprehensive metabolic panel    No orders of the defined types were placed in this encounter.   This patient was seen by Drema Dallas, PA-C in collaboration with Dr. Clayborn Bigness as a part of collaborative care agreement.   Total time spent:30 Minutes Time spent includes review of chart, medications, test results, and follow up plan with the patient.      Dr Lavera Guise Internal medicine

## 2020-07-17 ENCOUNTER — Telehealth: Payer: Self-pay

## 2020-07-17 NOTE — Telephone Encounter (Signed)
FMLA paperwork updated and faxed back to company, copy placed in scan and patient notified its complete at the front desk. [name]

## 2020-07-30 ENCOUNTER — Telehealth: Payer: Self-pay

## 2020-07-30 NOTE — Telephone Encounter (Signed)
Completed medical records for [employer]. Faxed to [phone]   Claim ID number [id number]

## 2020-07-30 NOTE — Telephone Encounter (Signed)
Received disability paperwork for patient and placed at front desk with Stepehn Eckard

## 2020-08-13 ENCOUNTER — Telehealth: Payer: Self-pay

## 2020-08-13 NOTE — Telephone Encounter (Signed)
FMLA paperwork completed by provider,faxed to [employer] at [phone], copy made and placed in scan. Patient advised ready for pickup at the front desk. [name]

## 2020-09-12 ENCOUNTER — Ambulatory Visit: Payer: Managed Care, Other (non HMO) | Admitting: Physician Assistant

## 2020-09-12 ENCOUNTER — Encounter: Payer: Self-pay | Admitting: Physician Assistant

## 2020-09-12 DIAGNOSIS — M5441 Lumbago with sciatica, right side: Secondary | ICD-10-CM | POA: Diagnosis not present

## 2020-09-12 DIAGNOSIS — N289 Disorder of kidney and ureter, unspecified: Secondary | ICD-10-CM

## 2020-09-12 DIAGNOSIS — G8929 Other chronic pain: Secondary | ICD-10-CM

## 2020-09-12 DIAGNOSIS — I10 Essential (primary) hypertension: Secondary | ICD-10-CM | POA: Diagnosis not present

## 2020-09-12 DIAGNOSIS — Z6841 Body Mass Index (BMI) 40.0 and over, adult: Secondary | ICD-10-CM

## 2020-09-12 MED ORDER — AMLODIPINE BESYLATE 5 MG PO TABS: 5.0000 mg | ORAL_TABLET | Freq: Every day | ORAL | 1 refills | Status: DC

## 2020-09-12 MED ORDER — FUROSEMIDE 20 MG PO TABS: 20.0000 mg | ORAL_TABLET | Freq: Every day | ORAL | 3 refills | Status: DC

## 2020-09-12 NOTE — Progress Notes (Signed)
[facility] [place], [place] [address]  Internal MEDICINE  Telephone Visit  Patient Name: April George  [id number]  [id number]  Date of Service: 09/15/2020  I connected with the patient at 2:11 by telephone and verified the patients identity using two identifiers.   I discussed the limitations, risks, security and privacy concerns of performing an evaluation and management service by telephone and the availability of in person appointments. I also discussed with the patient that there may be a patient responsible charge related to the service.  The patient expressed understanding and agrees to proceed.    Chief Complaint  Patient presents with   Telephone Assessment   Telephone Screen   Hypertension    HPI Pt is here virtually for routine follow up due to covid exposure. -Everybody at church got covid, but she has no symptoms -Has not started rybelsus; 6 lbs lost since last week. Grandbaby needs to lose weight so they have decided to lose weight together through diet and exercise and wants to hold off on any medications to aid this -Bp at home has been higher, 150/80s but is in some pain in the last 2 weeks with sciatica acting up again and is doing exercises that were recommended in PT. Has gabapentin but makes her sleepy so has to be careful when she takes it. Discussed taking it after work but not waiting until right before bedtime in case of prolonged sleepiness in the morning still -Forgot to get lab work done and will have this done now. Denies any leg swelling still since decreasing lasix by half to 20mg   Current Medication: Outpatient Encounter Medications as of 09/12/2020  Medication Sig   albuterol (VENTOLIN HFA) 108 (90 Base) MCG/ACT inhaler Inhale 2 puffs into the lungs every 6 (six) hours as needed for wheezing or shortness of breath.   buPROPion (WELLBUTRIN) 100 MG tablet Take 1 tablet (100 mg total) by mouth daily.   celecoxib (CELEBREX) 50 MG  capsule Take 1 capsule (50 mg total) by mouth 2 (two) times daily.   desloratadine (CLARINEX REDITAB) 5 MG disintegrating tablet Take 1 tablet (5 mg total) by mouth daily as needed.   fluticasone (FLONASE) 50 MCG/ACT nasal spray Place 2 sprays into both nostrils daily.   furosemide (LASIX) 20 MG tablet Take 1 tablet (20 mg total) by mouth daily.   gabapentin (NEURONTIN) 100 MG capsule TAKE 1 CAPSULE BY MOUTH THREE TIMES DAILY   HYDROcodone-acetaminophen (NORCO/VICODIN) 5-325 MG tablet Take 1 tablet by mouth 2 (two) times daily as needed for moderate pain.   hydrOXYzine (ATARAX/VISTARIL) 10 MG tablet Take 1-2 tablets (10-20 mg total) by mouth 2 (two) times daily as needed. For severe anxiety   lisinopril (ZESTRIL) 20 MG tablet Take 2 tablets (40 mg total) by mouth daily.   meloxicam (MOBIC) 7.5 MG tablet Take 7.5 mg by mouth daily.   tretinoin (RETIN-A) 0.025 % cream Apply topically at bedtime.   [DISCONTINUED] amLODipine (NORVASC) 5 MG tablet Take 1 tablet (5 mg total) by mouth daily.   [DISCONTINUED] furosemide (LASIX) 40 MG tablet Take 1 tablet (40 mg total) by mouth daily.   amLODipine (NORVASC) 5 MG tablet Take 1 tablet (5 mg total) by mouth daily.   Semaglutide (RYBELSUS) 7 MG TABS Take 7 mg by mouth daily. (Patient not taking: Reported on 09/12/2020)   No facility-administered encounter medications on file as of 09/12/2020.    Surgical History: Past Surgical History:  Procedure Laterality Date   ABDOMINAL  HYSTERECTOMY     CARPAL TUNNEL RELEASE Bilateral    COLONOSCOPY     COLONOSCOPY WITH PROPOFOL N/A 02/23/2019   Procedure: COLONOSCOPY WITH PROPOFOL;  Surgeon: Jonathon Bellows, MD;  Location[facility] ENDOSCOPY;  Service: Gastroenterology;  Laterality: N/A;    Medical History: Past Medical History:  Diagnosis Date   Asthma    Hypertension     Family History: Family History  Problem Relation Age of Onset   Breast cancer Mother 45   Breast cancer Cousin    Depression Sister     Anxiety disorder Sister     Social History   Socioeconomic History   Marital status: Married    Spouse name: keith   Number of children: 2   Years of education: Not on file   Highest education level: Associate degree: occupational, Hotel manager, or vocational program  Occupational History   Not on file  Tobacco Use   Smoking status: Never   Smokeless tobacco: Never  Vaping Use   Vaping Use: Never used  Substance and Sexual Activity   Alcohol use: No   Drug use: No   Sexual activity: Yes  Other Topics Concern   Not on file  Social History Narrative   Not on file   Social Determinants of Health   Financial Resource Strain: Not on file  Food Insecurity: Not on file  Transportation Needs: Not on file  Physical Activity: Not on file  Stress: Not on file  Social Connections: Not on file  Intimate Partner Violence: Not on file      Review of Systems  Constitutional:  Negative for chills, fatigue and unexpected weight change.  HENT:  Negative for congestion, postnasal drip, rhinorrhea, sneezing and sore throat.   Eyes:  Negative for redness.  Respiratory:  Negative for cough, chest tightness and shortness of breath.   Cardiovascular:  Negative for chest pain and palpitations.  Gastrointestinal:  Negative for abdominal pain, constipation, diarrhea, nausea and vomiting.  Genitourinary:  Negative for dysuria and frequency.  Musculoskeletal:  Positive for arthralgias and back pain. Negative for joint swelling and neck pain.  Skin:  Negative for rash.  Neurological: Negative.  Negative for tremors and numbness.  Hematological:  Negative for adenopathy. Does not bruise/bleed easily.  Psychiatric/Behavioral:  Negative for behavioral problems (Depression), sleep disturbance and suicidal ideas. The patient is not nervous/anxious.    Vital Signs: BP (!) 166/89   Ht 5\' 6"  (1.676 m)   Wt 293 lb (132.9 kg)   BMI 47.29 kg/m    Observation/Objective:  Pt is able to carry out  conversation   Assessment/Plan: 1. Primary hypertension Elevated recently due to increase in sciatica pain, patient is working on exercises and pain control.  We will continue on 20 mg of Lasix and have patient continue on 5 mg of amlodipine however may increase to 10 mg of amlodipine if blood pressures remain elevated.  Patient will monitor for any leg swelling and contact office if this occurs - furosemide (LASIX) 20 MG tablet; Take 1 tablet (20 mg total) by mouth daily.  Dispense: 30 tablet; Refill: 3 - amLODipine (NORVASC) 5 MG tablet; Take 1 tablet (5 mg total) by mouth daily.  Dispense: 90 tablet; Refill: 1  2. Abnormal renal function Patient will go and have lab work done now  3. Chronic right-sided low back pain with right-sided sciatica Patient is working on exercises given to her by physical therapy.  Patient also has gabapentin to use as needed and was educated to  try taking a little bit earlier in the evening to avoid grogginess in the morning  4. Class 3 severe obesity due to excess calories with serious comorbidity and body mass index (BMI) of 45.0 to 49.9 in adult [facility]) Patient is working on diet and exercise and is motivated to lose weight in conjunction with her grandbaby and wants to do so without any medications.  Patient reports she has lost 6 pounds since last visit and is continuing to work on this further.   General Counseling: ernesto lashway understanding of the findings of today's phone visit and agrees with plan of treatment. I have discussed any further diagnostic evaluation that may be needed or ordered today. We also reviewed her medications today. she has been encouraged to call the office with any questions or concerns that should arise related to todays visit.    No orders of the defined types were placed in this encounter.   Meds ordered this encounter  Medications   furosemide (LASIX) 20 MG tablet    Sig: Take 1 tablet (20 mg total) by mouth daily.     Dispense:  30 tablet    Refill:  3   amLODipine (NORVASC) 5 MG tablet    Sig: Take 1 tablet (5 mg total) by mouth daily.    Dispense:  90 tablet    Refill:  1    Time spent:30 Minutes    Dr Lavera Guise Internal medicine

## 2020-10-25 ENCOUNTER — Telehealth: Payer: Self-pay

## 2020-10-25 NOTE — Telephone Encounter (Signed)
Tretinoin 0.025% approved [place] [id number] 10-24-20 to 10-24-21

## 2020-11-03 ENCOUNTER — Encounter: Payer: Self-pay | Admitting: Internal Medicine

## 2020-11-03 ENCOUNTER — Other Ambulatory Visit: Payer: Self-pay | Admitting: Physician Assistant

## 2020-11-03 DIAGNOSIS — I10 Essential (primary) hypertension: Secondary | ICD-10-CM

## 2020-11-03 DIAGNOSIS — R600 Localized edema: Secondary | ICD-10-CM

## 2020-11-29 ENCOUNTER — Other Ambulatory Visit: Payer: Self-pay | Admitting: Nurse Practitioner

## 2020-11-29 DIAGNOSIS — I10 Essential (primary) hypertension: Secondary | ICD-10-CM

## 2020-12-11 LAB — COMPREHENSIVE METABOLIC PANEL
ALT: 12 IU/L (ref 0–32)
AST: 16 IU/L (ref 0–40)
Albumin/Globulin Ratio: 1.5 (ref 1.2–2.2)
Albumin: 4.4 g/dL (ref 3.8–4.9)
Alkaline Phosphatase: 76 IU/L (ref 44–121)
BUN/Creatinine Ratio: 18 (ref 9–23)
BUN: 17 mg/dL (ref 6–24)
Bilirubin Total: 0.3 mg/dL (ref 0.0–1.2)
CO2: 24 mmol/L (ref 20–29)
Calcium: 9.1 mg/dL (ref 8.7–10.2)
Chloride: 103 mmol/L (ref 96–106)
Creatinine, Ser: 0.94 mg/dL (ref 0.57–1.00)
Globulin, Total: 3 g/dL (ref 1.5–4.5)
Glucose: 90 mg/dL (ref 65–99)
Potassium: 4.5 mmol/L (ref 3.5–5.2)
Sodium: 140 mmol/L (ref 134–144)
Total Protein: 7.4 g/dL (ref 6.0–8.5)
eGFR: 70 mL/min/{1.73_m2} (ref >59)

## 2021-01-01 ENCOUNTER — Encounter: Payer: Self-pay | Admitting: Nurse Practitioner

## 2021-01-01 ENCOUNTER — Ambulatory Visit: Payer: Managed Care, Other (non HMO) | Admitting: Nurse Practitioner

## 2021-01-01 ENCOUNTER — Other Ambulatory Visit: Payer: Self-pay

## 2021-01-01 VITALS — BP 140/86 | HR 88 | Temp 98.2°F | Resp 16 | Ht 66.0 in | Wt 298.6 lb

## 2021-01-01 DIAGNOSIS — J0110 Acute frontal sinusitis, unspecified: Secondary | ICD-10-CM | POA: Diagnosis not present

## 2021-01-01 DIAGNOSIS — Z20828 Contact with and (suspected) exposure to other viral communicable diseases: Secondary | ICD-10-CM | POA: Diagnosis not present

## 2021-01-01 DIAGNOSIS — R52 Pain, unspecified: Secondary | ICD-10-CM | POA: Diagnosis not present

## 2021-01-01 LAB — POCT INFLUENZA A/B
Influenza A, POC: NEGATIVE
Influenza B, POC: NEGATIVE

## 2021-01-01 MED ORDER — AMOXICILLIN-POT CLAVULANATE 875-125 MG PO TABS: 1.0000 | ORAL_TABLET | Freq: Two times a day (BID) | ORAL | 0 refills | Status: DC

## 2021-01-01 NOTE — Progress Notes (Signed)
[facility] [place], [place] [address]  Internal MEDICINE  Office Visit Note  Patient Name: April George  [id number]  [id number]  Date of Service: 01/16/2021  Chief Complaint  Patient presents with   Acute Visit    Granddaughter tested positive for flu,    Fatigue   Sinusitis     HPI Arrion presents for an acute sick visit for possible symptoms of sinusitis. She reports fatigue, nasal congestion, postnasal drip, sinus pain/pressure, sore throat, cough, SOB, and headaches. She denies fever, chills, body aches, wheezing, chest tightness, sneezing, or ear pain. She has tried alkaseltzer plus but no other OTC medications or remedies. Her granddaughter tested positive for the flu. She has been taking alkaseltzer plus to treat her symptoms.    Current Medication:  Outpatient Encounter Medications as of 01/01/2021  Medication Sig   albuterol (VENTOLIN HFA) 108 (90 Base) MCG/ACT inhaler Inhale 2 puffs into the lungs every 6 (six) hours as needed for wheezing or shortness of breath.   amLODipine (NORVASC) 5 MG tablet Take 1 tablet (5 mg total) by mouth daily.   amoxicillin-clavulanate (AUGMENTIN) 875-125 MG tablet Take 1 tablet by mouth 2 (two) times daily.   buPROPion (WELLBUTRIN) 100 MG tablet Take 1 tablet (100 mg total) by mouth daily.   celecoxib (CELEBREX) 50 MG capsule Take 1 capsule (50 mg total) by mouth 2 (two) times daily.   desloratadine (CLARINEX REDITAB) 5 MG disintegrating tablet Take 1 tablet (5 mg total) by mouth daily as needed.   fluticasone (FLONASE) 50 MCG/ACT nasal spray Place 2 sprays into both nostrils daily.   furosemide (LASIX) 20 MG tablet Take 1 tablet (20 mg total) by mouth daily.   gabapentin (NEURONTIN) 100 MG capsule TAKE 1 CAPSULE BY MOUTH THREE TIMES DAILY   HYDROcodone-acetaminophen (NORCO/VICODIN) 5-325 MG tablet Take 1 tablet by mouth 2 (two) times daily as needed for moderate pain.   hydrOXYzine (ATARAX/VISTARIL) 10 MG tablet  Take 1-2 tablets (10-20 mg total) by mouth 2 (two) times daily as needed. For severe anxiety   lisinopril (ZESTRIL) 20 MG tablet Take 2 tablets (40 mg total) by mouth daily.   meloxicam (MOBIC) 7.5 MG tablet Take 7.5 mg by mouth daily.   Semaglutide (RYBELSUS) 7 MG TABS Take 7 mg by mouth daily.   tretinoin (RETIN-A) 0.025 % cream Apply topically at bedtime.   No facility-administered encounter medications on file as of 01/01/2021.      Medical History: Past Medical History:  Diagnosis Date   Asthma    Hypertension      Vital Signs: BP 140/86   Pulse 88   Temp 98.2 F (36.8 C)   Resp 16   Ht 5\' 6"  (1.676 m)   Wt 298 lb 9.6 oz (135.4 kg)   SpO2 97%   BMI 48.20 kg/m    Review of Systems  Constitutional:  Positive for fatigue. Negative for appetite change, chills and fever.  HENT:  Positive for congestion, postnasal drip, sinus pressure, sinus pain and sore throat. Negative for ear pain, sneezing and trouble swallowing.   Respiratory:  Positive for cough and shortness of breath. Negative for chest tightness and wheezing.   Cardiovascular: Negative.  Negative for chest pain and palpitations.  Gastrointestinal:  Negative for abdominal pain, constipation, diarrhea, nausea and vomiting.  Musculoskeletal:  Negative for myalgias.  Skin:  Negative for rash.  Neurological:  Positive for headaches.   Physical Exam Vitals reviewed.  Constitutional:  General: She is not in acute distress.    Appearance: Normal appearance. She is obese. She is ill-appearing.  HENT:     Head: Normocephalic and atraumatic.     Right Ear: Tympanic membrane, ear canal and external ear normal.     Left Ear: Tympanic membrane, ear canal and external ear normal.     Nose: Congestion and rhinorrhea present. Rhinorrhea is clear.     Right Turbinates: Swollen.     Left Turbinates: Swollen.     Right Sinus: Maxillary sinus tenderness present.     Left Sinus: Maxillary sinus tenderness present.      Mouth/Throat:     Lips: Pink.     Mouth: Mucous membranes are moist.     Pharynx: Uvula midline. Pharyngeal swelling and posterior oropharyngeal erythema present. No oropharyngeal exudate.     Tonsils: No tonsillar exudate. 1+ on the right. 1+ on the left.  Eyes:     Extraocular Movements: Extraocular movements intact.     Pupils: Pupils are equal, round, and reactive to light.  Cardiovascular:     Rate and Rhythm: Normal rate and regular rhythm.     Heart sounds: Normal heart sounds, S1 normal and S2 normal.  Pulmonary:     Effort: Pulmonary effort is normal. No accessory muscle usage or respiratory distress.     Breath sounds: Normal breath sounds and air entry.  Musculoskeletal:     Right lower leg: No edema.     Left lower leg: No edema.  Neurological:     Mental Status: She is alert.      Assessment/Plan: 1. Acute non-recurrent frontal sinusitis Empiric antibiotic treatment prescribed - amoxicillin-clavulanate (AUGMENTIN) 875-125 MG tablet; Take 1 tablet by mouth 2 (two) times daily.  Dispense: 14 tablet; Refill: 0  2. Exposure to the flu Tested in office parking lot for flu, was negative. - POCT Influenza A/B   General Counseling: Felisa verbalizes understanding of the findings of todays visit and agrees with plan of treatment. I have discussed any further diagnostic evaluation that may be needed or ordered today. We also reviewed her medications today. she has been encouraged to call the office with any questions or concerns that should arise related to todays visit.    Counseling:    Orders Placed This Encounter  Procedures   POCT Influenza A/B    Meds ordered this encounter  Medications   amoxicillin-clavulanate (AUGMENTIN) 875-125 MG tablet    Sig: Take 1 tablet by mouth 2 (two) times daily.    Dispense:  14 tablet    Refill:  0    Return if symptoms worsen or fail to improve.  [place] Controlled Substance Database was reviewed by me for overdose risk score  (ORS)  Time spent:30 Minutes Time spent with patient included reviewing progress notes, labs, imaging studies, and discussing plan for follow up.   This patient was seen by Jonetta Osgood, FNP-C in collaboration with Dr. Clayborn Bigness as a part of collaborative care agreement.  Treylen Gibbs R. Valetta Fuller, MSN, FNP-C Internal Medicine

## 2021-02-03 ENCOUNTER — Telehealth: Payer: Self-pay

## 2021-02-03 NOTE — Telephone Encounter (Signed)
Left vm to schedule pft-Toni

## 2021-02-18 ENCOUNTER — Encounter: Payer: Self-pay | Admitting: Nurse Practitioner

## 2021-02-18 ENCOUNTER — Other Ambulatory Visit: Payer: Self-pay

## 2021-02-18 ENCOUNTER — Ambulatory Visit (INDEPENDENT_AMBULATORY_CARE_PROVIDER_SITE_OTHER): Payer: Managed Care, Other (non HMO) | Admitting: Nurse Practitioner

## 2021-02-18 VITALS — BP 140/80 | HR 85 | Temp 97.8°F | Resp 16 | Ht 66.0 in | Wt 307.0 lb

## 2021-02-18 DIAGNOSIS — H1013 Acute atopic conjunctivitis, bilateral: Secondary | ICD-10-CM

## 2021-02-18 DIAGNOSIS — J4520 Mild intermittent asthma, uncomplicated: Secondary | ICD-10-CM

## 2021-02-18 DIAGNOSIS — I10 Essential (primary) hypertension: Secondary | ICD-10-CM

## 2021-02-18 DIAGNOSIS — M170 Bilateral primary osteoarthritis of knee: Secondary | ICD-10-CM | POA: Diagnosis not present

## 2021-02-18 DIAGNOSIS — F411 Generalized anxiety disorder: Secondary | ICD-10-CM

## 2021-02-18 DIAGNOSIS — E8940 Asymptomatic postprocedural ovarian failure: Secondary | ICD-10-CM

## 2021-02-18 DIAGNOSIS — R300 Dysuria: Secondary | ICD-10-CM | POA: Diagnosis not present

## 2021-02-18 DIAGNOSIS — E559 Vitamin D deficiency, unspecified: Secondary | ICD-10-CM

## 2021-02-18 DIAGNOSIS — Z0001 Encounter for general adult medical examination with abnormal findings: Secondary | ICD-10-CM | POA: Diagnosis not present

## 2021-02-18 DIAGNOSIS — Z23 Encounter for immunization: Secondary | ICD-10-CM

## 2021-02-18 DIAGNOSIS — J309 Allergic rhinitis, unspecified: Secondary | ICD-10-CM

## 2021-02-18 DIAGNOSIS — R7301 Impaired fasting glucose: Secondary | ICD-10-CM

## 2021-02-18 DIAGNOSIS — E782 Mixed hyperlipidemia: Secondary | ICD-10-CM

## 2021-02-18 DIAGNOSIS — Z1231 Encounter for screening mammogram for malignant neoplasm of breast: Secondary | ICD-10-CM

## 2021-02-18 DIAGNOSIS — F321 Major depressive disorder, single episode, moderate: Secondary | ICD-10-CM

## 2021-02-18 MED ORDER — FLUTICASONE PROPIONATE 50 MCG/ACT NA SUSP
2.0000 | Freq: Every day | NASAL | 3 refills | Status: DC
Start: 2021-02-18 — End: 2023-07-06

## 2021-02-18 MED ORDER — AMLODIPINE BESYLATE 5 MG PO TABS: 5.0000 mg | ORAL_TABLET | Freq: Every day | ORAL | 1 refills | Status: DC

## 2021-02-18 MED ORDER — BUSPIRONE HCL 5 MG PO TABS: 5.0000 mg | ORAL_TABLET | Freq: Three times a day (TID) | ORAL | 0 refills | Status: DC | PRN

## 2021-02-18 MED ORDER — DESLORATADINE 5 MG PO TBDP
5.0000 mg | ORAL_TABLET | Freq: Every day | ORAL | 5 refills | Status: DC | PRN
Start: 2021-02-18 — End: 2023-07-06

## 2021-02-18 MED ORDER — HYDROCODONE-ACETAMINOPHEN 5-325 MG PO TABS
1.0000 | ORAL_TABLET | Freq: Two times a day (BID) | ORAL | 0 refills | Status: DC | PRN
Start: 2021-02-18 — End: 2022-02-23

## 2021-02-18 MED ORDER — DULOXETINE HCL 30 MG PO CPEP: 30.0000 mg | ORAL_CAPSULE | Freq: Every day | ORAL | 2 refills | Status: DC

## 2021-02-18 MED ORDER — FUROSEMIDE 20 MG PO TABS: 20.0000 mg | ORAL_TABLET | Freq: Every day | ORAL | 3 refills | Status: DC

## 2021-02-18 MED ORDER — LISINOPRIL 20 MG PO TABS: 40.0000 mg | ORAL_TABLET | Freq: Every day | ORAL | 1 refills | Status: DC

## 2021-02-18 MED ORDER — ALBUTEROL SULFATE HFA 108 (90 BASE) MCG/ACT IN AERS: 2.0000 | INHALATION_SPRAY | Freq: Four times a day (QID) | RESPIRATORY_TRACT | 3 refills | Status: DC | PRN

## 2021-02-18 NOTE — Progress Notes (Signed)
[facility] [place], [place] [address]  Internal MEDICINE  Office Visit Note  Patient Name: April George  [id number]  [id number]  Date of Service: 02/18/2021  Chief Complaint  Patient presents with   Annual Exam   Asthma   Hypertension    HPI April George presents for an annual well visit and physical exam. She is a well appearing 60 yo female. She was treated for sinusitis at her previous office visit. Her symptoms have resolved. She has been having increased anxiety and has been grieving losses in the past year. She did see a psychiatrist a few years ago and is open to this again if necessary,  She is due for a routine mammogram. She had a routine colonoscopy in 2020, and is due in December next year for a routine colonoscopy again.    Current Medication: Outpatient Encounter Medications as of 02/18/2021  Medication Sig   busPIRone (BUSPAR) 5 MG tablet Take 1 tablet (5 mg total) by mouth 3 (three) times daily as needed (anxiety).   DULoxetine (CYMBALTA) 30 MG capsule Take 1 capsule (30 mg total) by mouth daily.   Zoster Vaccine Adjuvanted [facility]) injection Inject 0.5 mLs into the muscle once.   [DISCONTINUED] albuterol (VENTOLIN HFA) 108 (90 Base) MCG/ACT inhaler Inhale 2 puffs into the lungs every 6 (six) hours as needed for wheezing or shortness of breath.   [DISCONTINUED] amLODipine (NORVASC) 5 MG tablet Take 1 tablet (5 mg total) by mouth daily.   [DISCONTINUED] amoxicillin-clavulanate (AUGMENTIN) 875-125 MG tablet Take 1 tablet by mouth 2 (two) times daily.   [DISCONTINUED] buPROPion (WELLBUTRIN) 100 MG tablet Take 1 tablet (100 mg total) by mouth daily.   [DISCONTINUED] celecoxib (CELEBREX) 50 MG capsule Take 1 capsule (50 mg total) by mouth 2 (two) times daily.   [DISCONTINUED] desloratadine (CLARINEX REDITAB) 5 MG disintegrating tablet Take 1 tablet (5 mg total) by mouth daily as needed.   [DISCONTINUED] fluticasone (FLONASE) 50 MCG/ACT nasal spray  Place 2 sprays into both nostrils daily.   [DISCONTINUED] furosemide (LASIX) 20 MG tablet Take 1 tablet (20 mg total) by mouth daily.   [DISCONTINUED] gabapentin (NEURONTIN) 100 MG capsule TAKE 1 CAPSULE BY MOUTH THREE TIMES DAILY   [DISCONTINUED] HYDROcodone-acetaminophen (NORCO/VICODIN) 5-325 MG tablet Take 1 tablet by mouth 2 (two) times daily as needed for moderate pain.   [DISCONTINUED] hydrOXYzine (ATARAX/VISTARIL) 10 MG tablet Take 1-2 tablets (10-20 mg total) by mouth 2 (two) times daily as needed. For severe anxiety   [DISCONTINUED] lisinopril (ZESTRIL) 20 MG tablet Take 2 tablets (40 mg total) by mouth daily.   [DISCONTINUED] meloxicam (MOBIC) 7.5 MG tablet Take 7.5 mg by mouth daily.   [DISCONTINUED] Semaglutide (RYBELSUS) 7 MG TABS Take 7 mg by mouth daily.   [DISCONTINUED] tretinoin (RETIN-A) 0.025 % cream Apply topically at bedtime.   albuterol (VENTOLIN HFA) 108 (90 Base) MCG/ACT inhaler Inhale 2 puffs into the lungs every 6 (six) hours as needed for wheezing or shortness of breath.   amLODipine (NORVASC) 5 MG tablet Take 1 tablet (5 mg total) by mouth daily.   desloratadine (CLARINEX REDITAB) 5 MG disintegrating tablet Take 1 tablet (5 mg total) by mouth daily as needed.   fluticasone (FLONASE) 50 MCG/ACT nasal spray Place 2 sprays into both nostrils daily.   furosemide (LASIX) 20 MG tablet Take 1 tablet (20 mg total) by mouth daily.   HYDROcodone-acetaminophen (NORCO/VICODIN) 5-325 MG tablet Take 1 tablet by mouth 2 (two) times daily as needed for moderate  pain.   lisinopril (ZESTRIL) 20 MG tablet Take 2 tablets (40 mg total) by mouth daily.   No facility-administered encounter medications on file as of 02/18/2021.    Surgical History: Past Surgical History:  Procedure Laterality Date   ABDOMINAL HYSTERECTOMY     CARPAL TUNNEL RELEASE Bilateral    COLONOSCOPY     COLONOSCOPY WITH PROPOFOL N/A 02/23/2019   Procedure: COLONOSCOPY WITH PROPOFOL;  Surgeon: Jonathon Bellows, MD;   Location: [facility] ENDOSCOPY;  Service: Gastroenterology;  Laterality: N/A;    Medical History: Past Medical History:  Diagnosis Date   Asthma    Hypertension     Family History: Family History  Problem Relation Age of Onset   Breast cancer Mother 66   Breast cancer Cousin    Depression Sister    Anxiety disorder Sister     Social History   Socioeconomic History   Marital status: Married    Spouse name: keith   Number of children: 2   Years of education: Not on file   Highest education level: Associate degree: occupational, Hotel manager, or vocational program  Occupational History   Not on file  Tobacco Use   Smoking status: Never   Smokeless tobacco: Never  Vaping Use   Vaping Use: Never used  Substance and Sexual Activity   Alcohol use: No   Drug use: No   Sexual activity: Yes  Other Topics Concern   Not on file  Social History Narrative   Not on file   Social Determinants of Health   Financial Resource Strain: Not on file  Food Insecurity: Not on file  Transportation Needs: Not on file  Physical Activity: Not on file  Stress: Not on file  Social Connections: Not on file  Intimate Partner Violence: Not on file      Review of Systems  Constitutional:  Negative for activity change, appetite change, chills, fatigue, fever and unexpected weight change.  HENT: Negative.  Negative for congestion, ear pain, rhinorrhea, sore throat and trouble swallowing.   Eyes: Negative.   Respiratory: Negative.  Negative for cough, chest tightness, shortness of breath and wheezing.   Cardiovascular: Negative.  Negative for chest pain.  Gastrointestinal: Negative.  Negative for abdominal pain, blood in stool, constipation, diarrhea, nausea and vomiting.  Endocrine: Negative.   Genitourinary: Negative.  Negative for difficulty urinating, dysuria, frequency, hematuria and urgency.  Musculoskeletal: Negative.  Negative for arthralgias, back pain, joint swelling, myalgias and neck  pain.  Skin: Negative.  Negative for rash and wound.  Allergic/Immunologic: Negative.  Negative for immunocompromised state.  Neurological: Negative.  Negative for dizziness, seizures, numbness and headaches.  Hematological: Negative.   Psychiatric/Behavioral: Negative.  Negative for behavioral problems, self-injury and suicidal ideas. The patient is not nervous/anxious.    Vital Signs: BP 140/80   Pulse 85   Temp 97.8 F (36.6 C)   Resp 16   Ht _0  (1.676 m)   Wt (!) 307 lb (139.3 kg)   SpO2 98%   BMI 49.55 kg/m    Physical Exam Vitals reviewed.  Constitutional:      General: She is awake. She is not in acute distress.    Appearance: Normal appearance. She is well-developed and well-groomed. She is morbidly obese. She is not ill-appearing or diaphoretic.  HENT:     Head: Normocephalic and atraumatic.     Right Ear: Tympanic membrane, ear canal and external ear normal.     Left Ear: Tympanic membrane, ear canal and external ear normal.  Nose: Nose normal. No congestion or rhinorrhea.     Mouth/Throat:     Lips: Pink.     Mouth: Mucous membranes are moist.     Pharynx: Oropharynx is clear. Uvula midline. No oropharyngeal exudate or posterior oropharyngeal erythema.  Eyes:     General: Lids are normal. Vision grossly intact. Gaze aligned appropriately. No scleral icterus.       Right eye: No discharge.        Left eye: No discharge.     Extraocular Movements: Extraocular movements intact.     Conjunctiva/sclera: Conjunctivae normal.     Pupils: Pupils are equal, round, and reactive to light.     Funduscopic exam:    Right eye: Red reflex present.        Left eye: Red reflex present. Neck:     Thyroid: No thyromegaly.     Vascular: No JVD.     Trachea: Trachea and phonation normal. No tracheal deviation.  Cardiovascular:     Rate and Rhythm: Normal rate and regular rhythm.     Pulses: Normal pulses.     Heart sounds: Normal heart sounds, S1 normal and S2 normal.  No murmur heard.   No friction rub. No gallop.  Pulmonary:     Effort: Pulmonary effort is normal. No accessory muscle usage or respiratory distress.     Breath sounds: Normal breath sounds and air entry. No stridor. No wheezing or rales.  Chest:     Chest wall: No tenderness.     Comments: Declined clinical breast exam Abdominal:     General: Bowel sounds are normal. There is no distension.     Palpations: Abdomen is soft. There is no shifting dullness, fluid wave, mass or pulsatile mass.     Tenderness: There is no abdominal tenderness. There is no guarding or rebound.  Musculoskeletal:        General: No tenderness or deformity. Normal range of motion.     Cervical back: Normal range of motion and neck supple.     Right lower leg: No edema.     Left lower leg: No edema.  Lymphadenopathy:     Cervical: No cervical adenopathy.  Skin:    General: Skin is warm and dry.     Capillary Refill: Capillary refill takes less than 2 seconds.     Coloration: Skin is not pale.     Findings: No erythema or rash.  Neurological:     Mental Status: She is alert and oriented to person, place, and time.     Cranial Nerves: No cranial nerve deficit.     Motor: No abnormal muscle tone.     Coordination: Coordination normal.     Deep Tendon Reflexes: Reflexes are normal and symmetric.  Psychiatric:        Mood and Affect: Mood and affect normal.        Behavior: Behavior normal. Behavior is cooperative.        Thought Content: Thought content normal.        Judgment: Judgment normal.       Assessment/Plan: 1. Encounter for general adult medical examination with abnormal findings Age-appropriate preventive screenings and vaccinations discussed, annual physical exam completed. Routine labs for health maintenance ordered, see below. PHM updated.   2. Primary hypertension Stable, refills ordered - lisinopril (ZESTRIL) 20 MG tablet; Take 2 tablets (40 mg total) by mouth daily.  Dispense: 180  tablet; Refill: 1 - amLODipine (NORVASC) 5 MG tablet; Take 1 tablet (  5 mg total) by mouth daily.  Dispense: 90 tablet; Refill: 1 - furosemide (LASIX) 20 MG tablet; Take 1 tablet (20 mg total) by mouth daily.  Dispense: 30 tablet; Refill: 3  3. Chronic allergic rhinitis Refills ordered - desloratadine (CLARINEX REDITAB) 5 MG disintegrating tablet; Take 1 tablet (5 mg total) by mouth daily as needed.  Dispense: 30 tablet; Refill: 5 - fluticasone (FLONASE) 50 MCG/ACT nasal spray; Place 2 sprays into both nostrils daily.  Dispense: 48 g; Refill: 3 - Allergy Test  4. Primary osteoarthritis of both knees Refills ordered - HYDROcodone-acetaminophen (NORCO/VICODIN) 5-325 MG tablet; Take 1 tablet by mouth 2 (two) times daily as needed for moderate pain.  Dispense: 10 tablet; Refill: 0  5. Mild intermittent asthma without complication Stable, albuterol inhaler refills ordered - albuterol (VENTOLIN HFA) 108 (90 Base) MCG/ACT inhaler; Inhale 2 puffs into the lungs every 6 (six) hours as needed for wheezing or shortness of breath.  Dispense: 18 g; Refill: 3  6. Postsurgical menopause Bone density scan ordered - DG Bone Density; Future  7. Vitamin D deficiency Routine lab ordered - Vitamin D (25 hydroxy)  8. Impaired fasting glucose Routine labs ordered - CBC with Differential/Platelet - CMP14+EGFR - TSH + free T4  9. Mixed hyperlipidemia Routine lab ordered - Lipid Profile  10. Generalized anxiety disorder Duloxteine and buspirone added, follow up in 1 month - DULoxetine (CYMBALTA) 30 MG capsule; Take 1 capsule (30 mg total) by mouth daily.  Dispense: 30 capsule; Refill: 2 - busPIRone (BUSPAR) 5 MG tablet; Take 1 tablet (5 mg total) by mouth 3 (three) times daily as needed (anxiety).  Dispense: 60 tablet; Refill: 0  11. Depression, major, single episode, moderate (HCC) Duloxetine added, follow up in 1 month - DULoxetine (CYMBALTA) 30 MG capsule; Take 1 capsule (30 mg total) by mouth  daily.  Dispense: 30 capsule; Refill: 2  12. Dysuria Routine urinalysis done  - UA/M w/rflx Culture, Routine - Microscopic Examination - Urine Culture, Reflex  13. Encounter for screening mammogram for malignant neoplasm of breast  - MM 3D SCREEN BREAST BILATERAL; Future  14. Need for vaccination - Zoster Vaccine Adjuvanted [facility]) injection; Inject 0.5 mLs into the muscle once.      General Counseling: laverta harnisch understanding of the findings of todays visit and agrees with plan of treatment. I have discussed any further diagnostic evaluation that may be needed or ordered today. We also reviewed her medications today. she has been encouraged to call the office with any questions or concerns that should arise related to todays visit.    Orders Placed This Encounter  Procedures   Allergy Test   Microscopic Examination   Urine Culture, Reflex   MM 3D SCREEN BREAST BILATERAL   DG Bone Density   UA/M w/rflx Culture, Routine   CBC with Differential/Platelet   CMP14+EGFR   Lipid Profile   TSH + free T4   Vitamin D (25 hydroxy)    Meds ordered this encounter  Medications   DULoxetine (CYMBALTA) 30 MG capsule    Sig: Take 1 capsule (30 mg total) by mouth daily.    Dispense:  30 capsule    Refill:  2   busPIRone (BUSPAR) 5 MG tablet    Sig: Take 1 tablet (5 mg total) by mouth 3 (three) times daily as needed (anxiety).    Dispense:  60 tablet    Refill:  0   lisinopril (ZESTRIL) 20 MG tablet    Sig: Take 2 tablets (40  mg total) by mouth daily.    Dispense:  180 tablet    Refill:  1    Increased dose to 85m daily.   amLODipine (NORVASC) 5 MG tablet    Sig: Take 1 tablet (5 mg total) by mouth daily.    Dispense:  90 tablet    Refill:  1   furosemide (LASIX) 20 MG tablet    Sig: Take 1 tablet (20 mg total) by mouth daily.    Dispense:  30 tablet    Refill:  3   desloratadine (CLARINEX REDITAB) 5 MG disintegrating tablet    Sig: Take 1 tablet (5 mg total) by  mouth daily as needed.    Dispense:  30 tablet    Refill:  5   fluticasone (FLONASE) 50 MCG/ACT nasal spray    Sig: Place 2 sprays into both nostrils daily.    Dispense:  48 g    Refill:  3   HYDROcodone-acetaminophen (NORCO/VICODIN) 5-325 MG tablet    Sig: Take 1 tablet by mouth 2 (two) times daily as needed for moderate pain.    Dispense:  10 tablet    Refill:  0   albuterol (VENTOLIN HFA) 108 (90 Base) MCG/ACT inhaler    Sig: Inhale 2 puffs into the lungs every 6 (six) hours as needed for wheezing or shortness of breath.    Dispense:  18 g    Refill:  3    Does not need filled now, will call when she needs it filled.    Return in about 1 month (around 03/20/2021) for F/U, eval new med, ACarlPCP.   Total time spent:30 Minutes Time spent includes review of chart, medications, test results, and follow up plan with the patient.   [place] Controlled Substance Database was reviewed by me.  This patient was seen by AJonetta Osgood FNP-C in collaboration with Dr. FClayborn Bignessas a part of collaborative care agreement.  Emmah Bratcher R. AValetta Fuller MSN, FNP-C Internal medicine

## 2021-02-27 ENCOUNTER — Telehealth: Payer: Self-pay

## 2021-02-27 LAB — MICROSCOPIC EXAMINATION
Bacteria, UA: NONE SEEN
Casts: NONE SEEN /lpf
RBC, Urine: NONE SEEN /hpf (ref 0–2)

## 2021-02-27 LAB — UA/M W/RFLX CULTURE, ROUTINE
Bilirubin, UA: NEGATIVE
Glucose, UA: NEGATIVE
Ketones, UA: NEGATIVE
Nitrite, UA: NEGATIVE
Protein,UA: NEGATIVE
RBC, UA: NEGATIVE
Specific Gravity, UA: 1.011 (ref 1.005–1.030)
Urobilinogen, Ur: 0.2 mg/dL (ref 0.2–1.0)
pH, UA: 5.5 (ref 5.0–7.5)

## 2021-02-27 LAB — URINE CULTURE, REFLEX

## 2021-02-27 NOTE — Telephone Encounter (Signed)
Lvm and sent mychart message to schedule pft-Toni

## 2021-03-05 NOTE — Progress Notes (Signed)
Urine culture positive for proteus mirabilis. If patient not having symptoms, no need to treat. If she has symptoms, I can prescribed antibiotic treatment.

## 2021-03-13 ENCOUNTER — Encounter: Payer: Self-pay | Admitting: Internal Medicine

## 2021-03-18 ENCOUNTER — Encounter: Payer: Self-pay | Admitting: Nurse Practitioner

## 2021-03-18 ENCOUNTER — Encounter: Payer: Self-pay | Admitting: Internal Medicine

## 2021-03-18 ENCOUNTER — Ambulatory Visit (INDEPENDENT_AMBULATORY_CARE_PROVIDER_SITE_OTHER): Payer: Managed Care, Other (non HMO) | Admitting: Nurse Practitioner

## 2021-03-18 ENCOUNTER — Other Ambulatory Visit: Payer: Self-pay

## 2021-03-18 VITALS — BP 138/88 | HR 82 | Temp 98.4°F | Resp 16 | Ht 66.0 in | Wt 303.4 lb

## 2021-03-18 DIAGNOSIS — I10 Essential (primary) hypertension: Secondary | ICD-10-CM | POA: Diagnosis not present

## 2021-03-18 DIAGNOSIS — F411 Generalized anxiety disorder: Secondary | ICD-10-CM

## 2021-03-18 DIAGNOSIS — R252 Cramp and spasm: Secondary | ICD-10-CM

## 2021-03-18 MED ORDER — BUSPIRONE HCL 5 MG PO TABS: 5.0000 mg | ORAL_TABLET | Freq: Three times a day (TID) | ORAL | 2 refills | Status: DC | PRN

## 2021-03-18 NOTE — Progress Notes (Signed)
[facility] [place], [place] [address]  Internal MEDICINE  Office Visit Note  Patient Name: April George  [id number]  [id number]  Date of Service: 03/18/2021  Chief Complaint  Patient presents with   Follow-up   Hypertension    HPI April George presents for a follow-up visit for hypertension and anxiety.  She was started on duloxetine 30 mg daily and buspirone 5 mg 2-3 times daily as needed for anxiety.  She reports that this regimen has been very effective for her and her family members have noticed a positive change in her behavior.  She needs refills of the buspirone.  She also reports having muscle cramps in her legs as well as in her abdominal muscles at night.   Patient blood pressure is significantly elevated but much improved when rechecked see vitals.     Current Medication: Outpatient Encounter Medications as of 03/18/2021  Medication Sig   albuterol (VENTOLIN HFA) 108 (90 Base) MCG/ACT inhaler Inhale 2 puffs into the lungs every 6 (six) hours as needed for wheezing or shortness of breath.   amLODipine (NORVASC) 5 MG tablet Take 1 tablet (5 mg total) by mouth daily.   desloratadine (CLARINEX REDITAB) 5 MG disintegrating tablet Take 1 tablet (5 mg total) by mouth daily as needed.   DULoxetine (CYMBALTA) 30 MG capsule Take 1 capsule (30 mg total) by mouth daily.   fluticasone (FLONASE) 50 MCG/ACT nasal spray Place 2 sprays into both nostrils daily.   furosemide (LASIX) 20 MG tablet Take 1 tablet (20 mg total) by mouth daily.   HYDROcodone-acetaminophen (NORCO/VICODIN) 5-325 MG tablet Take 1 tablet by mouth 2 (two) times daily as needed for moderate pain.   lisinopril (ZESTRIL) 20 MG tablet Take 2 tablets (40 mg total) by mouth daily.   Zoster Vaccine Adjuvanted [facility]) injection Inject 0.5 mLs into the muscle once.   [DISCONTINUED] busPIRone (BUSPAR) 5 MG tablet Take 1 tablet (5 mg total) by mouth 3 (three) times daily as needed (anxiety).   busPIRone  (BUSPAR) 5 MG tablet Take 1 tablet (5 mg total) by mouth 3 (three) times daily as needed (anxiety).   No facility-administered encounter medications on file as of 03/18/2021.    Surgical History: Past Surgical History:  Procedure Laterality Date   ABDOMINAL HYSTERECTOMY     CARPAL TUNNEL RELEASE Bilateral    COLONOSCOPY     COLONOSCOPY WITH PROPOFOL N/A 02/23/2019   Procedure: COLONOSCOPY WITH PROPOFOL;  Surgeon: Jonathon Bellows, MD;  Location: [facility] ENDOSCOPY;  Service: Gastroenterology;  Laterality: N/A;    Medical History: Past Medical History:  Diagnosis Date   Asthma    Hypertension     Family History: Family History  Problem Relation Age of Onset   Breast cancer Mother 48   Breast cancer Cousin    Depression Sister    Anxiety disorder Sister     Social History   Socioeconomic History   Marital status: Married    Spouse name: keith   Number of children: 2   Years of education: Not on file   Highest education level: Associate degree: occupational, Hotel manager, or vocational program  Occupational History   Not on file  Tobacco Use   Smoking status: Never   Smokeless tobacco: Never  Vaping Use   Vaping Use: Never used  Substance and Sexual Activity   Alcohol use: No   Drug use: No   Sexual activity: Yes  Other Topics Concern   Not on file  Social History Narrative  Not on file   Social Determinants of Health   Financial Resource Strain: Not on file  Food Insecurity: Not on file  Transportation Needs: Not on file  Physical Activity: Not on file  Stress: Not on file  Social Connections: Not on file  Intimate Partner Violence: Not on file      Review of Systems  Constitutional:  Negative for chills, fatigue and unexpected weight change.  HENT:  Negative for congestion, rhinorrhea, sneezing and sore throat.   Eyes:  Negative for redness.  Respiratory:  Negative for cough, chest tightness and shortness of breath.   Cardiovascular:  Negative for chest  pain and palpitations.  Gastrointestinal:  Negative for abdominal pain, constipation, diarrhea, nausea and vomiting.  Genitourinary:  Negative for dysuria and frequency.  Musculoskeletal:  Negative for arthralgias, back pain, joint swelling and neck pain.  Skin:  Negative for rash.  Neurological: Negative.  Negative for tremors and numbness.  Hematological:  Negative for adenopathy. Does not bruise/bleed easily.  Psychiatric/Behavioral:  Negative for behavioral problems (Depression), sleep disturbance and suicidal ideas. The patient is not nervous/anxious.    Vital Signs: BP 138/88 Comment: 171/85   Pulse 82    Temp 98.4 F (36.9 C)    Resp 16    Ht 5\' 6"  (1.676 m)    Wt (!) 303 lb 6.4 oz (137.6 kg)    SpO2 98%    BMI 48.97 kg/m    Physical Exam Vitals reviewed.  Constitutional:      General: She is not in acute distress.    Appearance: Normal appearance. She is obese. She is not ill-appearing.  HENT:     Head: Normocephalic and atraumatic.  Eyes:     Pupils: Pupils are equal, round, and reactive to light.  Cardiovascular:     Rate and Rhythm: Normal rate and regular rhythm.  Pulmonary:     Effort: Pulmonary effort is normal. No respiratory distress.  Neurological:     Mental Status: She is alert and oriented to person, place, and time.     Cranial Nerves: No cranial nerve deficit.     Coordination: Coordination normal.     Gait: Gait normal.  Psychiatric:        Mood and Affect: Mood normal.        Behavior: Behavior normal.       Assessment/Plan: 1. Primary hypertension Blood pressure was significantly elevated significantly improved when rechecked, see vitals.  Currently patient is taking lisinopril and metoprolol.  2. Generalized anxiety disorder Duloxetine and buspirone have been effective, refills ordered.  Follow-up in 3 months to determine continued effectiveness. - busPIRone (BUSPAR) 5 MG tablet; Take 1 tablet (5 mg total) by mouth 3 (three) times daily as  needed (anxiety).  Dispense: 60 tablet; Refill: 2  3. Muscle cramps Patient instructed to do leg stretches before bedtime or take a B complex vitamin or vitamin D supplement.  If these do not help, recommended patient to try taking Benadryl at night as needed.  If none of these interventions help encourage patient to call the clinic for further instruction.     General Counseling: aily tzeng understanding of the findings of todays visit and agrees with plan of treatment. I have discussed any further diagnostic evaluation that may be needed or ordered today. We also reviewed her medications today. she has been encouraged to call the office with any questions or concerns that should arise related to todays visit.    No orders of the defined  types were placed in this encounter.   Meds ordered this encounter  Medications   busPIRone (BUSPAR) 5 MG tablet    Sig: Take 1 tablet (5 mg total) by mouth 3 (three) times daily as needed (anxiety).    Dispense:  60 tablet    Refill:  2    Return in about 3 months (around 06/16/2021) for F/U, anxiety med refill, Camila Maita PCP.   Total time spent:30 Minutes Time spent includes review of chart, medications, test results, and follow up plan with the patient.   [place] Controlled Substance Database was reviewed by me.  This patient was seen by Jonetta Osgood, FNP-C in collaboration with Dr. Clayborn Bigness as a part of collaborative care agreement.   Amairany Schumpert R. Valetta Fuller, MSN, FNP-C Internal medicine

## 2021-03-19 ENCOUNTER — Other Ambulatory Visit: Payer: Self-pay

## 2021-03-19 DIAGNOSIS — R0602 Shortness of breath: Secondary | ICD-10-CM

## 2021-03-21 LAB — TSH+FREE T4
Free T4: 1.01 ng/dL (ref 0.82–1.77)
TSH: 1.27 u[IU]/mL (ref 0.450–4.500)

## 2021-03-21 LAB — CBC WITH DIFFERENTIAL/PLATELET
Basophils Absolute: 0 10*3/uL (ref 0.0–0.2)
Basos: 1 %
EOS (ABSOLUTE): 0.3 10*3/uL (ref 0.0–0.4)
Eos: 7 %
Hematocrit: 40.1 % (ref 34.0–46.6)
Hemoglobin: 13.1 g/dL (ref 11.1–15.9)
Immature Grans (Abs): 0 10*3/uL (ref 0.0–0.1)
Immature Granulocytes: 0 %
Lymphocytes Absolute: 1.2 10*3/uL (ref 0.7–3.1)
Lymphs: 31 %
MCH: 27.1 pg (ref 26.6–33.0)
MCHC: 32.7 g/dL (ref 31.5–35.7)
MCV: 83 fL (ref 79–97)
Monocytes Absolute: 0.4 10*3/uL (ref 0.1–0.9)
Monocytes: 11 %
Neutrophils Absolute: 2 10*3/uL (ref 1.4–7.0)
Neutrophils: 50 %
Platelets: 257 10*3/uL (ref 150–450)
RBC: 4.83 x10E6/uL (ref 3.77–5.28)
RDW: 13 % (ref 11.7–15.4)
WBC: 3.9 10*3/uL (ref 3.4–10.8)

## 2021-03-21 LAB — CMP14+EGFR
ALT: 14 IU/L (ref 0–32)
AST: 19 IU/L (ref 0–40)
Albumin/Globulin Ratio: 1.3 (ref 1.2–2.2)
Albumin: 4 g/dL (ref 3.8–4.9)
Alkaline Phosphatase: 70 IU/L (ref 44–121)
BUN/Creatinine Ratio: 16 (ref 9–23)
BUN: 16 mg/dL (ref 6–24)
Bilirubin Total: 0.3 mg/dL (ref 0.0–1.2)
CO2: 24 mmol/L (ref 20–29)
Calcium: 9.3 mg/dL (ref 8.7–10.2)
Chloride: 101 mmol/L (ref 96–106)
Creatinine, Ser: 1 mg/dL (ref 0.57–1.00)
Globulin, Total: 3.1 g/dL (ref 1.5–4.5)
Glucose: 106 mg/dL — ABNORMAL HIGH (ref 70–99)
Potassium: 4.5 mmol/L (ref 3.5–5.2)
Sodium: 138 mmol/L (ref 134–144)
Total Protein: 7.1 g/dL (ref 6.0–8.5)
eGFR: 65 mL/min/{1.73_m2} (ref >59)

## 2021-03-21 LAB — VITAMIN D 25 HYDROXY (VIT D DEFICIENCY, FRACTURES): Vit D, 25-Hydroxy: 24.5 ng/mL — ABNORMAL LOW (ref 30.0–100.0)

## 2021-03-21 LAB — LIPID PANEL
Chol/HDL Ratio: 2.1 ratio (ref 0.0–4.4)
Cholesterol, Total: 149 mg/dL (ref 100–199)
HDL: 71 mg/dL (ref >39)
LDL Chol Calc (NIH): 63 mg/dL (ref 0–99)
Triglycerides: 79 mg/dL (ref 0–149)
VLDL Cholesterol Cal: 15 mg/dL (ref 5–40)

## 2021-03-28 ENCOUNTER — Telehealth: Payer: Self-pay

## 2021-03-28 NOTE — Telephone Encounter (Signed)
Left vm to confirm 01/11/23appointment-Toni

## 2021-04-02 ENCOUNTER — Other Ambulatory Visit: Payer: Self-pay

## 2021-04-02 ENCOUNTER — Telehealth: Payer: Self-pay

## 2021-04-02 ENCOUNTER — Ambulatory Visit: Payer: Managed Care, Other (non HMO) | Admitting: Internal Medicine

## 2021-04-02 DIAGNOSIS — R0602 Shortness of breath: Secondary | ICD-10-CM | POA: Diagnosis not present

## 2021-04-02 LAB — PULMONARY FUNCTION TEST

## 2021-04-02 NOTE — Telephone Encounter (Signed)
-----   Message from Jonetta Osgood, NP sent at 04/02/2021  8:33 AM EST ----- Please call patient and let her know her lab results: -CBC is normal - her metabolic panel is normal - her cholesterol levels are all normal - her thyroid levels are normal.  -her vitamin D level is slight low but significantly improved from the last level done 1 year ago. She should continue taking a vitamin D supplement.   Please remind her to call [facility] to schedule her mammogram and bone density scan, these were ordered on 03/20/21 thank you

## 2021-04-02 NOTE — Telephone Encounter (Signed)
Spoke to pt and informed her of lab results that they were normal except  her vitamin D level is slight low but significantly  improved from the last level done 1 year ago.  She should continue taking a vitamin D  supplement.    I also reminded pt to call [facility] to schedule her mammogram and  bone density scan, these were ordered  on 03/20/21 thank you

## 2021-04-02 NOTE — Telephone Encounter (Signed)
LMOM for pt to return call about lab results

## 2021-04-02 NOTE — Progress Notes (Signed)
Please call patient and let her know her lab results: -CBC is normal - her metabolic panel is normal - her cholesterol levels are all normal - her thyroid levels are normal.  -her vitamin D level is slight low but significantly improved from the last level done 1 year ago. She should continue taking a vitamin D supplement.   Please remind her to call [facility] to schedule her mammogram and bone density scan, these were ordered on 03/20/21 thank you

## 2021-04-08 NOTE — Procedures (Signed)
[facility] [address] [place], [address]    Complete Pulmonary Function Testing Interpretation:  FINDINGS:  Forced vital capacity is mildly decreased.  FEV1 is 1.53 L which is 71% of predicted and is mildly decreased.  FEV1 FVC ratio is mildly decreased.  Postbronchodilator there was no significant change in the FEV1.  Total lung capacity is mildly decreased.  Residual volume is mildly decreased.  Residual volume total lung capacity ratio is increased.  FRC was mildly decreased.  DLCO was normal.  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  [name], MD [facility] Pulmonary Critical Care Medicine Sleep Medicine

## 2021-05-06 ENCOUNTER — Encounter: Payer: Self-pay | Admitting: Internal Medicine

## 2021-06-12 ENCOUNTER — Ambulatory Visit: Payer: Managed Care, Other (non HMO) | Admitting: Nurse Practitioner

## 2021-06-12 ENCOUNTER — Encounter: Payer: Self-pay | Admitting: Nurse Practitioner

## 2021-06-12 ENCOUNTER — Other Ambulatory Visit: Payer: Self-pay

## 2021-06-12 ENCOUNTER — Telehealth: Payer: Self-pay

## 2021-06-12 VITALS — BP 140/90 | HR 70 | Temp 98.3°F | Resp 16 | Ht 65.0 in | Wt 304.2 lb

## 2021-06-12 DIAGNOSIS — R29818 Other symptoms and signs involving the nervous system: Secondary | ICD-10-CM

## 2021-06-12 DIAGNOSIS — R29898 Other symptoms and signs involving the musculoskeletal system: Secondary | ICD-10-CM

## 2021-06-12 DIAGNOSIS — G5603 Carpal tunnel syndrome, bilateral upper limbs: Secondary | ICD-10-CM | POA: Diagnosis not present

## 2021-06-12 DIAGNOSIS — F411 Generalized anxiety disorder: Secondary | ICD-10-CM | POA: Diagnosis not present

## 2021-06-12 DIAGNOSIS — I10 Essential (primary) hypertension: Secondary | ICD-10-CM | POA: Diagnosis not present

## 2021-06-12 DIAGNOSIS — F321 Major depressive disorder, single episode, moderate: Secondary | ICD-10-CM

## 2021-06-12 MED ORDER — DULOXETINE HCL 60 MG PO CPEP: 60.0000 mg | ORAL_CAPSULE | Freq: Every day | ORAL | 3 refills | Status: DC

## 2021-06-12 NOTE — Progress Notes (Signed)
[facility] ?[address] ?[place], [place] [address] ? ?Internal MEDICINE  ?Office Visit Note ? ?Patient Name: April George ? [id number]  ?[id number] ? ?Date of Service: 06/12/2021 ? ?Chief Complaint  ?Patient presents with  ? Follow-up  ?  Mostly right hand but both hands have difficulty doing fine motor skills, right hand is unable to hold a pencil correctly, has gotten worse recently   ? Anxiety  ? Results  ?  PFT  ? Hypertension  ? Asthma  ? ? ?HPI ?[name] presents for a follow-up visit for hypertension, asthma, anxiety and depression.  She recently had a pulmonary function test done that showed stable mild restrictive and mild obstructive lung disease.  Her blood pressure is slightly elevated but stable today. ?She has been taking duloxetine 30 mg daily and buspirone 5 mg 3 times daily as needed for her anxiety and depression.  At 1 point she did run out of duloxetine and wanted to see if she felt better without it, she reports she did not feel better without the medication and then filled her prescription and started taking it again.  She states that she feels fine for most of the day and does well at work but shuts down when she gets home at the end of the day.  She reports that when she gets home all she wants to do is high in her room and go to sleep and states that she does not function when she gets home.  She states that she feels like her anxiety level is increased mostly when she gets home and she does not want to do anything or leave the house. ?She also has a history of bilateral carpal tunnel syndrome and states that she has been having problems with fine motor skills such as holding a pencil in her hand.  She states that the problem is worse in her right hand.  She is just having difficulty doing skills that involve smaller more precise hand movements ? ? ?Current Medication: ?Outpatient Encounter Medications as of 06/12/2021  ?Medication Sig  ? albuterol (VENTOLIN HFA) 108 (90 Base) MCG/ACT  inhaler Inhale 2 puffs into the lungs every 6 (six) hours as needed for wheezing or shortness of breath.  ? amLODipine (NORVASC) 5 MG tablet Take 1 tablet (5 mg total) by mouth daily.  ? busPIRone (BUSPAR) 5 MG tablet Take 1 tablet (5 mg total) by mouth 3 (three) times daily as needed (anxiety).  ? desloratadine (CLARINEX REDITAB) 5 MG disintegrating tablet Take 1 tablet (5 mg total) by mouth daily as needed.  ? DULoxetine (CYMBALTA) 60 MG capsule Take 1 capsule (60 mg total) by mouth daily.  ? fluticasone (FLONASE) 50 MCG/ACT nasal spray Place 2 sprays into both nostrils daily.  ? furosemide (LASIX) 20 MG tablet Take 1 tablet (20 mg total) by mouth daily.  ? HYDROcodone-acetaminophen (NORCO/VICODIN) 5-325 MG tablet Take 1 tablet by mouth 2 (two) times daily as needed for moderate pain.  ? lisinopril (ZESTRIL) 20 MG tablet Take 2 tablets (40 mg total) by mouth daily.  ? Zoster Vaccine Adjuvanted [facility]) injection Inject 0.5 mLs into the muscle once.  ? [DISCONTINUED] DULoxetine (CYMBALTA) 30 MG capsule Take 1 capsule (30 mg total) by mouth daily.  ? ?No facility-administered encounter medications on file as of 06/12/2021.  ? ? ?Surgical History: ?Past Surgical History:  ?Procedure Laterality Date  ? ABDOMINAL HYSTERECTOMY    ? CARPAL TUNNEL RELEASE Bilateral   ? COLONOSCOPY    ?  COLONOSCOPY WITH PROPOFOL N/A 02/23/2019  ? Procedure: COLONOSCOPY WITH PROPOFOL;  Surgeon: Jonathon Bellows, MD;  Location: [facility] ENDOSCOPY;  Service: Gastroenterology;  Laterality: N/A;  ? ? ?Medical History: ?Past Medical History:  ?Diagnosis Date  ? Asthma   ? Hypertension   ? ? ?Family History: ?Family History  ?Problem Relation Age of Onset  ? Breast cancer Mother 27  ? Breast cancer Cousin   ? Depression Sister   ? Anxiety disorder Sister   ? ? ?Social History  ? ?Socioeconomic History  ? Marital status: Married  ?  Spouse name: April George  ? Number of children: 2  ? Years of education: Not on file  ? Highest education level: Associate degree:  occupational, Hotel manager, or vocational program  ?Occupational History  ? Not on file  ?Tobacco Use  ? Smoking status: Never  ? Smokeless tobacco: Never  ?Vaping Use  ? Vaping Use: Never used  ?Substance and Sexual Activity  ? Alcohol use: No  ? Drug use: No  ? Sexual activity: Yes  ?Other Topics Concern  ? Not on file  ?Social History Narrative  ? Not on file  ? ?Social Determinants of Health  ? ?Financial Resource Strain: Not on file  ?Food Insecurity: Not on file  ?Transportation Needs: Not on file  ?Physical Activity: Not on file  ?Stress: Not on file  ?Social Connections: Not on file  ?Intimate Partner Violence: Not on file  ? ? ? ? ?Review of Systems  ?Constitutional:  Negative for chills, fatigue and unexpected weight change.  ?HENT:  Negative for congestion, rhinorrhea, sneezing and sore throat.   ?Eyes:  Negative for redness.  ?Respiratory: Negative.  Negative for cough, chest tightness, shortness of breath and wheezing.   ?Cardiovascular: Negative.  Negative for chest pain and palpitations.  ?Gastrointestinal:  Negative for abdominal pain, constipation, diarrhea, nausea and vomiting.  ?Genitourinary:  Negative for dysuria and frequency.  ?Musculoskeletal:  Negative for arthralgias, back pain, joint swelling and neck pain.  ?Skin:  Negative for rash.  ?Neurological: Negative.  Negative for tremors and numbness.  ?Hematological:  Negative for adenopathy. Does not bruise/bleed easily.  ?Psychiatric/Behavioral:  Positive for behavioral problems (Depression) and dysphoric mood. Negative for self-injury, sleep disturbance and suicidal ideas. The patient is nervous/anxious.   ?     Hypersomnia  ? ?Vital Signs: ?BP 140/90   Pulse 70   Temp 98.3 ?F (36.8 ?C)   Resp 16   Ht '5\' 5"'$  (1.651 m)   Wt (!) 304 lb 3.2 oz (138 kg)   SpO2 99%   BMI 50.62 kg/m?  ? ? ?Physical Exam ?Vitals reviewed.  ?Constitutional:   ?   General: She is not in acute distress. ?   Appearance: Normal appearance. She is obese. She is not  ill-appearing.  ?HENT:  ?   Head: Normocephalic and atraumatic.  ?Eyes:  ?   Pupils: Pupils are equal, round, and reactive to light.  ?Cardiovascular:  ?   Rate and Rhythm: Normal rate and regular rhythm.  ?Pulmonary:  ?   Effort: Pulmonary effort is normal. No respiratory distress.  ?Neurological:  ?   Mental Status: She is alert and oriented to person, place, and time.  ?   Gait: Gait normal.  ?Psychiatric:     ?   Mood and Affect: Mood normal.     ?   Behavior: Behavior normal.  ? ? ? ? ? ?Assessment/Plan: ?1. Primary hypertension ?Blood pressure is slightly elevated but stable today, will  reevaluate blood pressure at her next office visit in 1 month, will adjust medications if needed at her next office visit ? ?2. Bilateral carpal tunnel syndrome ?Patient has bilateral carpal tunnel syndrome and has not seen orthopedic in some time for this, patient referred to Dr. Peggye Ley for evaluation ?- Ambulatory referral to Orthopedic Surgery ? ?3. Fine motor impairment ?In conjunction with her diagnosis of carpal tunnel syndrome, patient referred to Dr. Peggye Ley for further evaluation ?- Ambulatory referral to Orthopedic Surgery ? ?4. Generalized anxiety disorder ?Duloxetine increased to 60 mg daily, patient may continue buspirone as prescribed.  We will follow-up with patient in 1 month to evaluate effectiveness of increased dose of duloxetine.  We will discuss adjusting buspirone dose if necessary at next office visit. ?- DULoxetine (CYMBALTA) 60 MG capsule; Take 1 capsule (60 mg total) by mouth daily.  Dispense: 30 capsule; Refill: 3 ? ?5. Depression, major, single episode, moderate ([place]) ?Duloxetine dose increased to 60 mg daily, will follow-up with patient in 1 month to evaluate effectiveness of increased dose. ?- DULoxetine (CYMBALTA) 60 MG capsule; Take 1 capsule (60 mg total) by mouth daily.  Dispense: 30 capsule; Refill: 3 ? ? ?General Counseling: margit batte understanding of the findings of todays visit and  agrees with plan of treatment. I have discussed any further diagnostic evaluation that may be needed or ordered today. We also reviewed her medications today. she has been encouraged to call the office with any qu

## 2021-06-12 NOTE — Telephone Encounter (Signed)
Referral to orthopedic surgery printed and put in April George's folder. ?

## 2021-06-13 ENCOUNTER — Telehealth: Payer: Self-pay

## 2021-06-13 ENCOUNTER — Encounter: Payer: Self-pay | Admitting: Nurse Practitioner

## 2021-06-13 NOTE — Telephone Encounter (Signed)
Ortho surgery referral sent via proficient to Dr. Verita Lamb w/ [facility] ?

## 2021-07-15 ENCOUNTER — Ambulatory Visit: Payer: Managed Care, Other (non HMO) | Admitting: Nurse Practitioner

## 2021-07-22 ENCOUNTER — Ambulatory Visit: Payer: Managed Care, Other (non HMO) | Admitting: Nurse Practitioner

## 2021-10-22 ENCOUNTER — Telehealth: Payer: Self-pay

## 2021-10-22 NOTE — Telephone Encounter (Signed)
Pt called that her ankle are swelling she went for vacation as per alyssa advised that elevated her feet and take 1 extra furosemide today and take Advil and see how she is feeling if not better need appt

## 2022-02-02 ENCOUNTER — Telehealth: Payer: Self-pay

## 2022-02-02 NOTE — Telephone Encounter (Signed)
Left patient message stating that it is best to see [facility] for trigger finger and hand pain per Alyssa.

## 2022-02-03 ENCOUNTER — Encounter: Payer: Managed Care, Other (non HMO) | Admitting: Nurse Practitioner

## 2022-02-16 ENCOUNTER — Telehealth: Payer: Self-pay | Admitting: Nurse Practitioner

## 2022-02-16 NOTE — Telephone Encounter (Signed)
Left vm and sent mychart message to confirm 02/23/22 appointment-April George

## 2022-02-23 ENCOUNTER — Ambulatory Visit (INDEPENDENT_AMBULATORY_CARE_PROVIDER_SITE_OTHER): Payer: Managed Care, Other (non HMO) | Admitting: Nurse Practitioner

## 2022-02-23 ENCOUNTER — Encounter: Payer: Self-pay | Admitting: Nurse Practitioner

## 2022-02-23 DIAGNOSIS — Z0001 Encounter for general adult medical examination with abnormal findings: Secondary | ICD-10-CM | POA: Diagnosis not present

## 2022-02-23 DIAGNOSIS — E782 Mixed hyperlipidemia: Secondary | ICD-10-CM

## 2022-02-23 DIAGNOSIS — Z760 Encounter for issue of repeat prescription: Secondary | ICD-10-CM

## 2022-02-23 DIAGNOSIS — Z1211 Encounter for screening for malignant neoplasm of colon: Secondary | ICD-10-CM

## 2022-02-23 DIAGNOSIS — Z1231 Encounter for screening mammogram for malignant neoplasm of breast: Secondary | ICD-10-CM

## 2022-02-23 DIAGNOSIS — Z6841 Body Mass Index (BMI) 40.0 and over, adult: Secondary | ICD-10-CM

## 2022-02-23 DIAGNOSIS — E559 Vitamin D deficiency, unspecified: Secondary | ICD-10-CM

## 2022-02-23 DIAGNOSIS — M170 Bilateral primary osteoarthritis of knee: Secondary | ICD-10-CM | POA: Diagnosis not present

## 2022-02-23 DIAGNOSIS — E66813 Obesity, class 3: Secondary | ICD-10-CM

## 2022-02-23 MED ORDER — DULOXETINE HCL 60 MG PO CPEP: 60.0000 mg | ORAL_CAPSULE | Freq: Every day | ORAL | 3 refills | Status: DC

## 2022-02-23 MED ORDER — HYDROCODONE-ACETAMINOPHEN 5-325 MG PO TABS: 1.0000 | ORAL_TABLET | Freq: Two times a day (BID) | ORAL | 0 refills | Status: DC | PRN

## 2022-02-23 MED ORDER — AMLODIPINE BESYLATE 5 MG PO TABS: 5.0000 mg | ORAL_TABLET | Freq: Every day | ORAL | 1 refills | Status: DC

## 2022-02-23 MED ORDER — LISINOPRIL 20 MG PO TABS: 40.0000 mg | ORAL_TABLET | Freq: Every day | ORAL | 1 refills | Status: DC

## 2022-02-23 MED ORDER — ZOSTER VAC RECOMB ADJUVANTED 50 MCG/0.5ML IM SUSR: 0.5000 mL | Freq: Once | INTRAMUSCULAR | 0 refills | Status: AC

## 2022-02-23 MED ORDER — DICLOFENAC SODIUM 75 MG PO TBEC: 75.0000 mg | DELAYED_RELEASE_TABLET | Freq: Two times a day (BID) | ORAL | 0 refills | Status: DC

## 2022-02-23 MED ORDER — PHENTERMINE HCL 37.5 MG PO TABS: 37.5000 mg | ORAL_TABLET | Freq: Every day | ORAL | 0 refills | Status: DC

## 2022-02-23 MED ORDER — FUROSEMIDE 20 MG PO TABS: 20.0000 mg | ORAL_TABLET | Freq: Every day | ORAL | 3 refills | Status: DC

## 2022-02-23 NOTE — Progress Notes (Signed)
[facility] [place], [place] [address]  Internal MEDICINE  Office Visit Note  Patient Name: April George  [id number]  [id number]  Date of Service: 02/23/2022  Chief Complaint  Patient presents with   Annual Exam   Hypertension    HPI [name] presents for an annual well visit and physical exam.  Well-appearing 61 y.o. female with hypertension, allergic rhinitis, osteoarthritis, and depression.  Routine CRC screening: due for colonoscopy  Routine mammogram: due for routine mammogram Pap smear: due in 2025 Labs:  due for routine labs  New or worsening pain: none Other concerns: overdue to refill medications, has been without medications for a long time, BP is elevated. Also wants to lose weight, knees hurt and it is due to holding up her body weight.  --wants to try sample of Zepbound when available.     Current Medication: Outpatient Encounter Medications as of 02/23/2022  Medication Sig   diclofenac (VOLTAREN) 75 MG EC tablet Take 1 tablet (75 mg total) by mouth 2 (two) times daily.   phentermine (ADIPEX-P) 37.5 MG tablet Take 1 tablet (37.5 mg total) by mouth daily before breakfast.   [EXPIRED] Zoster Vaccine Adjuvanted [facility]) injection Inject 0.5 mLs into the muscle once for 1 dose.   albuterol (VENTOLIN HFA) 108 (90 Base) MCG/ACT inhaler Inhale 2 puffs into the lungs every 6 (six) hours as needed for wheezing or shortness of breath.   amLODipine (NORVASC) 5 MG tablet Take 1 tablet (5 mg total) by mouth daily.   busPIRone (BUSPAR) 5 MG tablet Take 1 tablet (5 mg total) by mouth 3 (three) times daily as needed (anxiety).   desloratadine (CLARINEX REDITAB) 5 MG disintegrating tablet Take 1 tablet (5 mg total) by mouth daily as needed.   DULoxetine (CYMBALTA) 60 MG capsule Take 1 capsule (60 mg total) by mouth daily.   fluticasone (FLONASE) 50 MCG/ACT nasal spray Place 2 sprays into both nostrils daily.   furosemide (LASIX) 20 MG tablet Take 1 tablet  (20 mg total) by mouth daily.   HYDROcodone-acetaminophen (NORCO/VICODIN) 5-325 MG tablet Take 1 tablet by mouth 2 (two) times daily as needed for moderate pain.   lisinopril (ZESTRIL) 20 MG tablet Take 2 tablets (40 mg total) by mouth daily.   [DISCONTINUED] amLODipine (NORVASC) 5 MG tablet Take 1 tablet (5 mg total) by mouth daily.   [DISCONTINUED] DULoxetine (CYMBALTA) 60 MG capsule Take 1 capsule (60 mg total) by mouth daily.   [DISCONTINUED] furosemide (LASIX) 20 MG tablet Take 1 tablet (20 mg total) by mouth daily.   [DISCONTINUED] HYDROcodone-acetaminophen (NORCO/VICODIN) 5-325 MG tablet Take 1 tablet by mouth 2 (two) times daily as needed for moderate pain.   [DISCONTINUED] lisinopril (ZESTRIL) 20 MG tablet Take 2 tablets (40 mg total) by mouth daily.   [DISCONTINUED] Zoster Vaccine Adjuvanted [facility]) injection Inject 0.5 mLs into the muscle once.   No facility-administered encounter medications on file as of 02/23/2022.    Surgical History: Past Surgical History:  Procedure Laterality Date   ABDOMINAL HYSTERECTOMY     CARPAL TUNNEL RELEASE Bilateral    COLONOSCOPY     COLONOSCOPY WITH PROPOFOL N/A 02/23/2019   Procedure: COLONOSCOPY WITH PROPOFOL;  Surgeon: Jonathon Bellows, MD;  Location: [facility] ENDOSCOPY;  Service: Gastroenterology;  Laterality: N/A;    Medical History: Past Medical History:  Diagnosis Date   Asthma    Hypertension     Family History: Family History  Problem Relation Age of Onset   Breast cancer Mother 60  Breast cancer Cousin    Depression Sister    Anxiety disorder Sister     Social History   Socioeconomic History   Marital status: Married    Spouse name: April George   Number of children: 2   Years of education: Not on file   Highest education level: Associate degree: occupational, Hotel manager, or vocational program  Occupational History   Not on file  Tobacco Use   Smoking status: Never   Smokeless tobacco: Never  Vaping Use   Vaping Use: Never  used  Substance and Sexual Activity   Alcohol use: No   Drug use: No   Sexual activity: Yes  Other Topics Concern   Not on file  Social History Narrative   Not on file   Social Determinants of Health   Financial Resource Strain: Low Risk  (07/12/2018)   Overall Financial Resource Strain (CARDIA)    Difficulty of Paying Living Expenses: Not hard at all  Food Insecurity: No Food Insecurity (07/12/2018)   Hunger Vital Sign    Worried About Running Out of Food in the Last Year: Never true    Ran Out of Food in the Last Year: Never true  Transportation Needs: No Transportation Needs (07/12/2018)   PRAPARE - Hydrologist (Medical): No    Lack of Transportation (Non-Medical): No  Physical Activity: Inactive (07/12/2018)   Exercise Vital Sign    Days of Exercise per Week: 0 days    Minutes of Exercise per Session: 0 min  Stress: No Stress Concern Present (07/12/2018)   [place]    Feeling of Stress : Not at all  Social Connections: Unknown (07/12/2018)   Social Connection and Isolation Panel [NHANES]    Frequency of Communication with Friends and Family: Not on file    Frequency of Social Gatherings with Friends and Family: Not on file    Attends Religious Services: More than 4 times per year    Active Member of [employer] or Organizations: No    Attends Archivist Meetings: Never    Marital Status: Married  Human resources officer Violence: Not At Risk (07/12/2018)   Humiliation, Afraid, Rape, and Kick questionnaire    Fear of Current or Ex-Partner: No    Emotionally Abused: No    Physically Abused: No    Sexually Abused: No      Review of Systems  Constitutional:  Negative for activity change, appetite change, chills, fatigue, fever and unexpected weight change.  HENT: Negative.  Negative for congestion, ear pain, rhinorrhea, sore throat and trouble swallowing.   Eyes: Negative.    Respiratory: Negative.  Negative for cough, chest tightness, shortness of breath and wheezing.   Cardiovascular: Negative.  Negative for chest pain.  Gastrointestinal: Negative.  Negative for abdominal pain, blood in stool, constipation, diarrhea, nausea and vomiting.  Endocrine: Negative.   Genitourinary: Negative.  Negative for difficulty urinating, dysuria, frequency, hematuria and urgency.  Musculoskeletal: Negative.  Negative for arthralgias, back pain, joint swelling, myalgias and neck pain.  Skin: Negative.  Negative for rash and wound.  Allergic/Immunologic: Negative.  Negative for immunocompromised state.  Neurological: Negative.  Negative for dizziness, seizures, numbness and headaches.  Hematological: Negative.   Psychiatric/Behavioral: Negative.  Negative for behavioral problems, self-injury and suicidal ideas. The patient is not nervous/anxious.     Vital Signs: BP (!) 160/85   Pulse 90   Temp (!) 97 F (36.1 C)   Resp 16  Ht _0  (1.651 m)   Wt (!) 317 lb 3.2 oz (143.9 kg)   SpO2 97%   BMI 52.78 kg/m    Physical Exam Vitals reviewed.  Constitutional:      General: She is awake. She is not in acute distress.    Appearance: Normal appearance. She is well-developed and well-groomed. She is morbidly obese. She is not ill-appearing or diaphoretic.  HENT:     Head: Normocephalic and atraumatic.     Right Ear: Tympanic membrane, ear canal and external ear normal.     Left Ear: Tympanic membrane, ear canal and external ear normal.     Nose: Nose normal. No congestion or rhinorrhea.     Mouth/Throat:     Lips: Pink.     Mouth: Mucous membranes are moist.     Pharynx: Oropharynx is clear. Uvula midline. No oropharyngeal exudate or posterior oropharyngeal erythema.  Eyes:     General: Lids are normal. Vision grossly intact. Gaze aligned appropriately. No scleral icterus.       Right eye: No discharge.        Left eye: No discharge.     Extraocular Movements:  Extraocular movements intact.     Conjunctiva/sclera: Conjunctivae normal.     Pupils: Pupils are equal, round, and reactive to light.     Funduscopic exam:    Right eye: Red reflex present.        Left eye: Red reflex present. Neck:     Thyroid: No thyromegaly.     Vascular: No JVD.     Trachea: Trachea and phonation normal. No tracheal deviation.  Cardiovascular:     Rate and Rhythm: Normal rate and regular rhythm.     Pulses: Normal pulses.     Heart sounds: Normal heart sounds, S1 normal and S2 normal. No murmur heard.    No friction rub. No gallop.  Pulmonary:     Effort: Pulmonary effort is normal. No accessory muscle usage or respiratory distress.     Breath sounds: Normal breath sounds and air entry. No stridor. No wheezing or rales.  Chest:     Chest wall: No tenderness.     Comments: Declined clinical breast exam Abdominal:     General: Bowel sounds are normal. There is no distension.     Palpations: Abdomen is soft. There is no shifting dullness, fluid wave, mass or pulsatile mass.     Tenderness: There is no abdominal tenderness. There is no guarding or rebound.  Musculoskeletal:        General: No tenderness or deformity. Normal range of motion.     Cervical back: Normal range of motion and neck supple.     Right lower leg: No edema.     Left lower leg: No edema.  Lymphadenopathy:     Cervical: No cervical adenopathy.  Skin:    General: Skin is warm and dry.     Capillary Refill: Capillary refill takes less than 2 seconds.     Coloration: Skin is not pale.     Findings: No erythema or rash.  Neurological:     Mental Status: She is alert and oriented to person, place, and time.     Cranial Nerves: No cranial nerve deficit.     Motor: No abnormal muscle tone.     Coordination: Coordination normal.     Deep Tendon Reflexes: Reflexes are normal and symmetric.  Psychiatric:        Mood and Affect: Mood and affect  normal.        Behavior: Behavior normal. Behavior  is cooperative.        Thought Content: Thought content normal.        Judgment: Judgment normal.        Assessment/Plan: 1. Encounter for general adult medical examination with abnormal findings Age-appropriate preventive screenings and vaccinations discussed, annual physical exam completed. Routine labs for health maintenance ordered, see below. PHM updated.  - Zoster Vaccine Adjuvanted [facility]) injection; Inject 0.5 mLs into the muscle once for 1 dose.  Dispense: 0.5 mL; Refill: 0 - CBC with Differential/Platelet - CMP14+EGFR - Lipid Profile - Vitamin D (25 hydroxy)  2. Mixed hyperlipidemia Routine labs ordered - CBC with Differential/Platelet - CMP14+EGFR - Lipid Profile  3. Vitamin D deficiency Routine lab ordered - Vitamin D (25 hydroxy)  4. Primary osteoarthritis of both knees Start diclofenac twice daily, may take hydrocodone for severe pain - diclofenac (VOLTAREN) 75 MG EC tablet; Take 1 tablet (75 mg total) by mouth 2 (two) times daily.  Dispense: 60 tablet; Refill: 0  5. Class 3 severe obesity due to excess calories with serious comorbidity and body mass index (BMI) of 45.0 to 49.9 in adult [facility]) Will try 1 month of phentermine for now but plan to try Zepbound or Rybelsus in January if insurance will cover it. - phentermine (ADIPEX-P) 37.5 MG tablet; Take 1 tablet (37.5 mg total) by mouth daily before breakfast.  Dispense: 30 tablet; Refill: 0  6. Medication refill - DULoxetine (CYMBALTA) 60 MG capsule; Take 1 capsule (60 mg total) by mouth daily.  Dispense: 90 capsule; Refill: 3 - amLODipine (NORVASC) 5 MG tablet; Take 1 tablet (5 mg total) by mouth daily.  Dispense: 90 tablet; Refill: 1 - furosemide (LASIX) 20 MG tablet; Take 1 tablet (20 mg total) by mouth daily.  Dispense: 90 tablet; Refill: 3 - lisinopril (ZESTRIL) 20 MG tablet; Take 2 tablets (40 mg total) by mouth daily.  Dispense: 180 tablet; Refill: 1 - HYDROcodone-acetaminophen (NORCO/VICODIN) 5-325 MG  tablet; Take 1 tablet by mouth 2 (two) times daily as needed for moderate pain.  Dispense: 10 tablet; Refill: 0  7. Encounter for screening mammogram for malignant neoplasm of breast Routine mammogram ordered - MM 3D SCREEN BREAST BILATERAL; Future  8. Screening for colon cancer Due for repeat colonoscopy - Ambulatory referral to Gastroenterology      General Counseling: nareh matzke understanding of the findings of todays visit and agrees with plan of treatment. I have discussed any further diagnostic evaluation that may be needed or ordered today. We also reviewed her medications today. she has been encouraged to call the office with any questions or concerns that should arise related to todays visit.    Orders Placed This Encounter  Procedures   MM 3D SCREEN BREAST BILATERAL   CBC with Differential/Platelet   CMP14+EGFR   Lipid Profile   Vitamin D (25 hydroxy)   Ambulatory referral to Gastroenterology    Meds ordered this encounter  Medications   phentermine (ADIPEX-P) 37.5 MG tablet    Sig: Take 1 tablet (37.5 mg total) by mouth daily before breakfast.    Dispense:  30 tablet    Refill:  0    Fill with good rx discount   Zoster Vaccine Adjuvanted [facility]) injection    Sig: Inject 0.5 mLs into the muscle once for 1 dose.    Dispense:  0.5 mL    Refill:  0   DULoxetine (CYMBALTA) 60 MG capsule  Sig: Take 1 capsule (60 mg total) by mouth daily.    Dispense:  90 capsule    Refill:  3   amLODipine (NORVASC) 5 MG tablet    Sig: Take 1 tablet (5 mg total) by mouth daily.    Dispense:  90 tablet    Refill:  1   furosemide (LASIX) 20 MG tablet    Sig: Take 1 tablet (20 mg total) by mouth daily.    Dispense:  90 tablet    Refill:  3   lisinopril (ZESTRIL) 20 MG tablet    Sig: Take 2 tablets (40 mg total) by mouth daily.    Dispense:  180 tablet    Refill:  1    Increased dose to 41m daily.   HYDROcodone-acetaminophen (NORCO/VICODIN) 5-325 MG tablet    Sig:  Take 1 tablet by mouth 2 (two) times daily as needed for moderate pain.    Dispense:  10 tablet    Refill:  0   diclofenac (VOLTAREN) 75 MG EC tablet    Sig: Take 1 tablet (75 mg total) by mouth 2 (two) times daily.    Dispense:  60 tablet    Refill:  0    Return in about 1 month (around 03/26/2022) for F/U, eval new med, Jacksyn Beeks PCP, Labs.   Total time spent:30 Minutes Time spent includes review of chart, medications, test results, and follow up plan with the patient.   [place] Controlled Substance Database was reviewed by me.  This patient was seen by AJonetta Osgood FNP-C in collaboration with Dr. FClayborn Bignessas a part of collaborative care agreement.  Ruthia Person R. AValetta Fuller MSN, FNP-C Internal medicine

## 2022-02-24 ENCOUNTER — Encounter: Payer: Self-pay | Admitting: Nurse Practitioner

## 2022-02-26 ENCOUNTER — Other Ambulatory Visit: Payer: Self-pay

## 2022-02-26 ENCOUNTER — Telehealth: Payer: Self-pay

## 2022-02-26 DIAGNOSIS — Z86010 Personal history of colonic polyps: Secondary | ICD-10-CM

## 2022-02-26 MED ORDER — NA SULFATE-K SULFATE-MG SULF 17.5-3.13-1.6 GM/177ML PO SOLN: 1.0000 | Freq: Once | ORAL | 0 refills | Status: AC

## 2022-02-26 NOTE — Telephone Encounter (Signed)
Gastroenterology Pre-Procedure Review  Request Date: 03/31/22 Requesting Physician: Dr. Vicente Males  PATIENT REVIEW QUESTIONS: The patient responded to the following health history questions as indicated:    1. Are you having any GI issues? no 2. Do you have a personal history of Polyps? yes (02/23/19 colonoscopy performed by Dr. Vicente Males) 3. Do you have a family history of Colon Cancer or Polyps? no 4. Diabetes Mellitus? no 5. Joint replacements in the past 12 months?no 6. Major health problems in the past 3 months?no 7. Any artificial heart valves, MVP, or defibrillator?no    MEDICATIONS & ALLERGIES:    Patient reports the following regarding taking any anticoagulation/antiplatelet therapy:   Plavix, Coumadin, Eliquis, Xarelto, Lovenox, Pradaxa, Brilinta, or Effient? no Aspirin? no  Patient confirms/reports the following medications:  Current Outpatient Medications  Medication Sig Dispense Refill   albuterol (VENTOLIN HFA) 108 (90 Base) MCG/ACT inhaler Inhale 2 puffs into the lungs every 6 (six) hours as needed for wheezing or shortness of breath. 18 g 3   amLODipine (NORVASC) 5 MG tablet Take 1 tablet (5 mg total) by mouth daily. 90 tablet 1   busPIRone (BUSPAR) 5 MG tablet Take 1 tablet (5 mg total) by mouth 3 (three) times daily as needed (anxiety). 60 tablet 2   desloratadine (CLARINEX REDITAB) 5 MG disintegrating tablet Take 1 tablet (5 mg total) by mouth daily as needed. 30 tablet 5   diclofenac (VOLTAREN) 75 MG EC tablet Take 1 tablet (75 mg total) by mouth 2 (two) times daily. 60 tablet 0   DULoxetine (CYMBALTA) 60 MG capsule Take 1 capsule (60 mg total) by mouth daily. 90 capsule 3   fluticasone (FLONASE) 50 MCG/ACT nasal spray Place 2 sprays into both nostrils daily. 48 g 3   furosemide (LASIX) 20 MG tablet Take 1 tablet (20 mg total) by mouth daily. 90 tablet 3   HYDROcodone-acetaminophen (NORCO/VICODIN) 5-325 MG tablet Take 1 tablet by mouth 2 (two) times daily as needed for  moderate pain. 10 tablet 0   lisinopril (ZESTRIL) 20 MG tablet Take 2 tablets (40 mg total) by mouth daily. 180 tablet 1   phentermine (ADIPEX-P) 37.5 MG tablet Take 1 tablet (37.5 mg total) by mouth daily before breakfast. 30 tablet 0   No current facility-administered medications for this visit.    Patient confirms/reports the following allergies:  Allergies  Allergen Reactions   Dust Mite Extract     Other reaction(s): Wheezing   Codeine Other (See Comments)    Altered mental status     No orders of the defined types were placed in this encounter.   AUTHORIZATION INFORMATION Primary Insurance: 1D#: Group #:  Secondary Insurance: 1D#: Group #:  SCHEDULE INFORMATION: Date: 03/31/22 Time: Location: [facility]

## 2022-03-20 LAB — CBC WITH DIFFERENTIAL/PLATELET
Basophils Absolute: 0 10*3/uL (ref 0.0–0.2)
Basos: 1 %
EOS (ABSOLUTE): 0.2 10*3/uL (ref 0.0–0.4)
Eos: 7 %
Hematocrit: 44.2 % (ref 34.0–46.6)
Hemoglobin: 14.7 g/dL (ref 11.1–15.9)
Immature Grans (Abs): 0 10*3/uL (ref 0.0–0.1)
Immature Granulocytes: 0 %
Lymphocytes Absolute: 1.4 10*3/uL (ref 0.7–3.1)
Lymphs: 39 %
MCH: 27.2 pg (ref 26.6–33.0)
MCHC: 33.3 g/dL (ref 31.5–35.7)
MCV: 82 fL (ref 79–97)
Monocytes Absolute: 0.4 10*3/uL (ref 0.1–0.9)
Monocytes: 12 %
Neutrophils Absolute: 1.4 10*3/uL (ref 1.4–7.0)
Neutrophils: 41 %
Platelets: 255 10*3/uL (ref 150–450)
RBC: 5.41 x10E6/uL — ABNORMAL HIGH (ref 3.77–5.28)
RDW: 12.8 % (ref 11.7–15.4)
WBC: 3.5 10*3/uL (ref 3.4–10.8)

## 2022-03-20 LAB — CMP14+EGFR
ALT: 16 IU/L (ref 0–32)
AST: 11 IU/L (ref 0–40)
Albumin/Globulin Ratio: 1.4 (ref 1.2–2.2)
Albumin: 4.3 g/dL (ref 3.8–4.9)
Alkaline Phosphatase: 73 IU/L (ref 44–121)
BUN/Creatinine Ratio: 17 (ref 12–28)
BUN: 17 mg/dL (ref 8–27)
Bilirubin Total: 0.2 mg/dL (ref 0.0–1.2)
CO2: 23 mmol/L (ref 20–29)
Calcium: 9.2 mg/dL (ref 8.7–10.3)
Chloride: 103 mmol/L (ref 96–106)
Creatinine, Ser: 1.02 mg/dL — ABNORMAL HIGH (ref 0.57–1.00)
Globulin, Total: 3.1 g/dL (ref 1.5–4.5)
Glucose: 97 mg/dL (ref 70–99)
Potassium: 4.6 mmol/L (ref 3.5–5.2)
Sodium: 140 mmol/L (ref 134–144)
Total Protein: 7.4 g/dL (ref 6.0–8.5)
eGFR: 63 mL/min/{1.73_m2} (ref >59)

## 2022-03-20 LAB — LIPID PANEL
Chol/HDL Ratio: 2.3 ratio (ref 0.0–4.4)
Cholesterol, Total: 137 mg/dL (ref 100–199)
HDL: 60 mg/dL (ref >39)
LDL Chol Calc (NIH): 57 mg/dL (ref 0–99)
Triglycerides: 112 mg/dL (ref 0–149)
VLDL Cholesterol Cal: 20 mg/dL (ref 5–40)

## 2022-03-20 LAB — VITAMIN D 25 HYDROXY (VIT D DEFICIENCY, FRACTURES): Vit D, 25-Hydroxy: 13.8 ng/mL — ABNORMAL LOW (ref 30.0–100.0)

## 2022-03-24 ENCOUNTER — Encounter: Payer: Self-pay | Admitting: Nurse Practitioner

## 2022-03-24 ENCOUNTER — Ambulatory Visit: Payer: Managed Care, Other (non HMO) | Admitting: Nurse Practitioner

## 2022-03-24 VITALS — BP 138/75 | HR 100 | Temp 98.1°F | Resp 16 | Ht 65.0 in | Wt 309.8 lb

## 2022-03-24 DIAGNOSIS — Z760 Encounter for issue of repeat prescription: Secondary | ICD-10-CM | POA: Diagnosis not present

## 2022-03-24 DIAGNOSIS — Z6841 Body Mass Index (BMI) 40.0 and over, adult: Secondary | ICD-10-CM

## 2022-03-24 DIAGNOSIS — E66813 Obesity, class 3: Secondary | ICD-10-CM

## 2022-03-24 DIAGNOSIS — Z23 Encounter for immunization: Secondary | ICD-10-CM | POA: Diagnosis not present

## 2022-03-24 DIAGNOSIS — M170 Bilateral primary osteoarthritis of knee: Secondary | ICD-10-CM | POA: Diagnosis not present

## 2022-03-24 MED ORDER — PHENTERMINE HCL 37.5 MG PO TABS: 37.5000 mg | ORAL_TABLET | Freq: Every day | ORAL | 0 refills | Status: DC

## 2022-03-24 MED ORDER — ALBUTEROL SULFATE HFA 108 (90 BASE) MCG/ACT IN AERS: 2.0000 | INHALATION_SPRAY | Freq: Four times a day (QID) | RESPIRATORY_TRACT | 3 refills | Status: DC | PRN

## 2022-03-24 MED ORDER — ZOSTER VAC RECOMB ADJUVANTED 50 MCG/0.5ML IM SUSR: 0.5000 mL | Freq: Once | INTRAMUSCULAR | 0 refills | Status: AC

## 2022-03-24 MED ORDER — TETANUS-DIPHTH-ACELL PERTUSSIS 5-2.5-18.5 LF-MCG/0.5 IM SUSP: 0.5000 mL | Freq: Once | INTRAMUSCULAR | 0 refills | Status: AC

## 2022-03-24 NOTE — Progress Notes (Signed)
[facility] [place], [place] [address]  Internal MEDICINE  Office Visit Note  Patient Name: April George  [id number]  [id number]  Date of Service: 03/24/2022  Chief Complaint  Patient presents with   Follow-up   Hypertension    HPI Diksha presents for a follow up visit for hypertension, weight loss management and osteoarthritis.  Osteoarthritis of both knees -- on diclofenac and it is working well. Pain well managed.  Weight loss management -- lost 8 lbs since last visit on phentermine Hypertension -- recheck BP, improved and stable when rechecked.     Current Medication: Outpatient Encounter Medications as of 03/24/2022  Medication Sig   amLODipine (NORVASC) 5 MG tablet Take 1 tablet (5 mg total) by mouth daily.   busPIRone (BUSPAR) 5 MG tablet Take 1 tablet (5 mg total) by mouth 3 (three) times daily as needed (anxiety).   desloratadine (CLARINEX REDITAB) 5 MG disintegrating tablet Take 1 tablet (5 mg total) by mouth daily as needed.   diclofenac (VOLTAREN) 75 MG EC tablet Take 1 tablet (75 mg total) by mouth 2 (two) times daily.   DULoxetine (CYMBALTA) 60 MG capsule Take 1 capsule (60 mg total) by mouth daily.   fluticasone (FLONASE) 50 MCG/ACT nasal spray Place 2 sprays into both nostrils daily.   furosemide (LASIX) 20 MG tablet Take 1 tablet (20 mg total) by mouth daily.   HYDROcodone-acetaminophen (NORCO/VICODIN) 5-325 MG tablet Take 1 tablet by mouth 2 (two) times daily as needed for moderate pain.   lisinopril (ZESTRIL) 20 MG tablet Take 2 tablets (40 mg total) by mouth daily.   [DISCONTINUED] albuterol (VENTOLIN HFA) 108 (90 Base) MCG/ACT inhaler Inhale 2 puffs into the lungs every 6 (six) hours as needed for wheezing or shortness of breath.   [DISCONTINUED] phentermine (ADIPEX-P) 37.5 MG tablet Take 1 tablet (37.5 mg total) by mouth daily before breakfast.   [DISCONTINUED] Tdap (BOOSTRIX) 5-2.5-18.5 LF-MCG/0.5 injection Inject 0.5 mLs into the  muscle once.   [DISCONTINUED] Zoster Vaccine Adjuvanted [facility]) injection Inject 0.5 mLs into the muscle once.   albuterol (VENTOLIN HFA) 108 (90 Base) MCG/ACT inhaler Inhale 2 puffs into the lungs every 6 (six) hours as needed for wheezing or shortness of breath.   phentermine (ADIPEX-P) 37.5 MG tablet Take 1 tablet (37.5 mg total) by mouth daily before breakfast.   Tdap (BOOSTRIX) 5-2.5-18.5 LF-MCG/0.5 injection Inject 0.5 mLs into the muscle once for 1 dose.   Zoster Vaccine Adjuvanted [facility]) injection Inject 0.5 mLs into the muscle once for 1 dose.   No facility-administered encounter medications on file as of 03/24/2022.    Surgical History: Past Surgical History:  Procedure Laterality Date   ABDOMINAL HYSTERECTOMY     CARPAL TUNNEL RELEASE Bilateral    COLONOSCOPY     COLONOSCOPY WITH PROPOFOL N/A 02/23/2019   Procedure: COLONOSCOPY WITH PROPOFOL;  Surgeon: Jonathon Bellows, MD;  Location: [facility] ENDOSCOPY;  Service: Gastroenterology;  Laterality: N/A;    Medical History: Past Medical History:  Diagnosis Date   Asthma    Hypertension     Family History: Family History  Problem Relation Age of Onset   Breast cancer Mother 68   Breast cancer Cousin    Depression Sister    Anxiety disorder Sister     Social History   Socioeconomic History   Marital status: Married    Spouse name: keith   Number of children: 2   Years of education: Not on file   Highest education level:  Associate degree: occupational, technical, or vocational program  Occupational History   Not on file  Tobacco Use   Smoking status: Never   Smokeless tobacco: Never  Vaping Use   Vaping Use: Never used  Substance and Sexual Activity   Alcohol use: No   Drug use: No   Sexual activity: Yes  Other Topics Concern   Not on file  Social History Narrative   Not on file   Social Determinants of Health   Financial Resource Strain: Low Risk  (07/12/2018)   Overall Financial Resource Strain (CARDIA)     Difficulty of Paying Living Expenses: Not hard at all  Food Insecurity: No Food Insecurity (07/12/2018)   Hunger Vital Sign    Worried About Running Out of Food in the Last Year: Never true    Ran Out of Food in the Last Year: Never true  Transportation Needs: No Transportation Needs (07/12/2018)   PRAPARE - Hydrologist (Medical): No    Lack of Transportation (Non-Medical): No  Physical Activity: Inactive (07/12/2018)   Exercise Vital Sign    Days of Exercise per Week: 0 days    Minutes of Exercise per Session: 0 min  Stress: No Stress Concern Present (07/12/2018)   [place]    Feeling of Stress : Not at all  Social Connections: Unknown (07/12/2018)   Social Connection and Isolation Panel [NHANES]    Frequency of Communication with Friends and Family: Not on file    Frequency of Social Gatherings with Friends and Family: Not on file    Attends Religious Services: More than 4 times per year    Active Member of [employer] or Organizations: No    Attends Archivist Meetings: Never    Marital Status: Married  Human resources officer Violence: Not At Risk (07/12/2018)   Humiliation, Afraid, Rape, and Kick questionnaire    Fear of Current or Ex-Partner: No    Emotionally Abused: No    Physically Abused: No    Sexually Abused: No      Review of Systems  Constitutional:  Negative for chills, fatigue and unexpected weight change.  HENT:  Negative for congestion, rhinorrhea, sneezing and sore throat.   Eyes:  Negative for redness.  Respiratory: Negative.  Negative for cough, chest tightness, shortness of breath and wheezing.   Cardiovascular: Negative.  Negative for chest pain and palpitations.  Gastrointestinal:  Negative for abdominal pain, constipation, diarrhea, nausea and vomiting.  Genitourinary:  Negative for dysuria and frequency.  Musculoskeletal:  Negative for arthralgias, back pain,  joint swelling and neck pain.  Skin:  Negative for rash.  Neurological: Negative.  Negative for tremors and numbness.  Hematological:  Negative for adenopathy. Does not bruise/bleed easily.  Psychiatric/Behavioral:  Positive for behavioral problems (Depression) and dysphoric mood. Negative for self-injury, sleep disturbance and suicidal ideas. The patient is nervous/anxious.        Hypersomnia    Vital Signs: BP 138/75 Comment: 177/92  Pulse 100   Temp 98.1 F (36.7 C)   Resp 16   Ht '5\' 5"'$  (1.651 m)   Wt (!) 309 lb 12.8 oz (140.5 kg)   SpO2 94%   BMI 51.55 kg/m    Physical Exam Vitals reviewed.  Constitutional:      General: She is not in acute distress.    Appearance: Normal appearance. She is obese. She is not ill-appearing.  HENT:     Head: Normocephalic and  atraumatic.  Eyes:     Pupils: Pupils are equal, round, and reactive to light.  Cardiovascular:     Rate and Rhythm: Normal rate and regular rhythm.  Pulmonary:     Effort: Pulmonary effort is normal. No respiratory distress.  Neurological:     Mental Status: She is alert and oriented to person, place, and time.     Gait: Gait normal.  Psychiatric:        Mood and Affect: Mood normal.        Behavior: Behavior normal.       Assessment/Plan: 1. Primary osteoarthritis of both knees Arthritic pain is improved with diclofenac 75 mg twice daily. Continue as prescribed.   2. Class 3 severe obesity due to excess calories with serious comorbidity and body mass index (BMI) of 45.0 to 49.9 in adult [facility]) Lost 8 lbs with phentermine, continue for another month, follow up in 4 weeks.  - phentermine (ADIPEX-P) 37.5 MG tablet; Take 1 tablet (37.5 mg total) by mouth daily before breakfast.  Dispense: 30 tablet; Refill: 0  3. Medication refill - phentermine (ADIPEX-P) 37.5 MG tablet; Take 1 tablet (37.5 mg total) by mouth daily before breakfast.  Dispense: 30 tablet; Refill: 0 - albuterol (VENTOLIN HFA) 108 (90 Base)  MCG/ACT inhaler; Inhale 2 puffs into the lungs every 6 (six) hours as needed for wheezing or shortness of breath.  Dispense: 18 g; Refill: 3  4. Need for vaccination - Tdap (Gate) 5-2.5-18.5 LF-MCG/0.5 injection; Inject 0.5 mLs into the muscle once for 1 dose.  Dispense: 0.5 mL; Refill: 0 - Zoster Vaccine Adjuvanted [facility]) injection; Inject 0.5 mLs into the muscle once for 1 dose.  Dispense: 0.5 mL; Refill: 0   General Counseling: Zoee verbalizes understanding of the findings of todays visit and agrees with plan of treatment. I have discussed any further diagnostic evaluation that may be needed or ordered today. We also reviewed her medications today. she has been encouraged to call the office with any questions or concerns that should arise related to todays visit.    No orders of the defined types were placed in this encounter.   Meds ordered this encounter  Medications   Tdap (BOOSTRIX) 5-2.5-18.5 LF-MCG/0.5 injection    Sig: Inject 0.5 mLs into the muscle once for 1 dose.    Dispense:  0.5 mL    Refill:  0   Zoster Vaccine Adjuvanted [facility]) injection    Sig: Inject 0.5 mLs into the muscle once for 1 dose.    Dispense:  0.5 mL    Refill:  0   phentermine (ADIPEX-P) 37.5 MG tablet    Sig: Take 1 tablet (37.5 mg total) by mouth daily before breakfast.    Dispense:  30 tablet    Refill:  0    Fill with good rx discount   albuterol (VENTOLIN HFA) 108 (90 Base) MCG/ACT inhaler    Sig: Inhale 2 puffs into the lungs every 6 (six) hours as needed for wheezing or shortness of breath.    Dispense:  18 g    Refill:  3    Does not need filled now, will call when she needs it filled.    Return in about 4 weeks (around 04/21/2022) for F/U, Weight loss, Willodean Leven PCP.   Total time spent:30 Minutes Time spent includes review of chart, medications, test results, and follow up plan with the patient.   [place] Controlled Substance Database was reviewed by me.  This patient was seen by  Jonetta Osgood, FNP-C in collaboration with Dr. Clayborn Bigness as a part of collaborative care agreement.   Yarethzy Croak R. Valetta Fuller, MSN, FNP-C Internal medicine

## 2022-03-30 ENCOUNTER — Encounter: Payer: Self-pay | Admitting: Gastroenterology

## 2022-03-31 ENCOUNTER — Encounter: Payer: Self-pay | Admitting: Gastroenterology

## 2022-03-31 ENCOUNTER — Ambulatory Visit: Payer: Managed Care, Other (non HMO) | Admitting: Anesthesiology

## 2022-03-31 ENCOUNTER — Ambulatory Visit
Admission: RE | Admit: 2022-03-31 | Discharge: 2022-03-31 | Disposition: A | Discharge status: Home / Self Care | Payer: Managed Care, Other (non HMO) | Attending: Gastroenterology | Admitting: Gastroenterology

## 2022-03-31 ENCOUNTER — Encounter
Admission: RE | Disposition: A | Discharge status: Home / Self Care | Payer: Self-pay | Source: Home / Self Care | Attending: Gastroenterology

## 2022-03-31 DIAGNOSIS — Z86010 Personal history of colonic polyps: Secondary | ICD-10-CM | POA: Diagnosis not present

## 2022-03-31 DIAGNOSIS — Z1211 Encounter for screening for malignant neoplasm of colon: Secondary | ICD-10-CM | POA: Insufficient documentation

## 2022-03-31 DIAGNOSIS — I10 Essential (primary) hypertension: Secondary | ICD-10-CM | POA: Insufficient documentation

## 2022-03-31 DIAGNOSIS — J45909 Unspecified asthma, uncomplicated: Secondary | ICD-10-CM | POA: Insufficient documentation

## 2022-03-31 HISTORY — PX: COLONOSCOPY WITH PROPOFOL: SHX5780

## 2022-03-31 SURGERY — COLONOSCOPY WITH PROPOFOL
Anesthesia: General

## 2022-03-31 MED ORDER — SODIUM CHLORIDE 0.9 % IV SOLN: INTRAVENOUS | Status: DC

## 2022-03-31 MED ORDER — PROPOFOL 10 MG/ML IV BOLUS
INTRAVENOUS | Status: DC | PRN
  Administered 2022-03-31: 100 mg via INTRAVENOUS

## 2022-03-31 MED ORDER — PROPOFOL 500 MG/50ML IV EMUL
INTRAVENOUS | Status: DC | PRN
  Administered 2022-03-31: 120 ug/kg/min via INTRAVENOUS

## 2022-03-31 MED ORDER — LIDOCAINE HCL (CARDIAC) PF 100 MG/5ML IV SOSY
PREFILLED_SYRINGE | INTRAVENOUS | Status: DC | PRN
  Administered 2022-03-31: 50 mg via INTRAVENOUS

## 2022-03-31 NOTE — Anesthesia Preprocedure Evaluation (Signed)
Anesthesia Evaluation  Patient identified by MRN, date of birth, ID band Patient awake    Reviewed: Allergy & Precautions, NPO status , Patient's Chart, lab work & pertinent test results  History of Anesthesia Complications Negative for: history of anesthetic complications  Airway Mallampati: III  TM Distance: >3 FB Neck ROM: full    Dental  (+) Chipped   Pulmonary neg shortness of breath, asthma    Pulmonary exam normal        Cardiovascular Exercise Tolerance: Good hypertension, (-) angina (-) Past MI Normal cardiovascular exam     Neuro/Psych  PSYCHIATRIC DISORDERS      negative neurological ROS     GI/Hepatic negative GI ROS, Neg liver ROS,neg GERD  ,,  Endo/Other  negative endocrine ROS    Renal/GU negative Renal ROS  negative genitourinary   Musculoskeletal   Abdominal   Peds  Hematology negative hematology ROS (+)   Anesthesia Other Findings Past Medical History: No date: Asthma No date: Hypertension  Past Surgical History: No date: ABDOMINAL HYSTERECTOMY No date: CARPAL TUNNEL RELEASE; Bilateral No date: COLONOSCOPY 02/23/2019: COLONOSCOPY WITH PROPOFOL; N/A     Comment:  Procedure: COLONOSCOPY WITH PROPOFOL;  Surgeon: Jonathon Bellows, MD;  Location: [facility] ENDOSCOPY;  Service:               Gastroenterology;  Laterality: N/A;  BMI    Body Mass Index: 50.89 kg/m      Reproductive/Obstetrics negative OB ROS                             Anesthesia Physical Anesthesia Plan  ASA: 3  Anesthesia Plan: General   Post-op Pain Management:    Induction: Intravenous  PONV Risk Score and Plan: Propofol infusion and TIVA  Airway Management Planned: Natural Airway and Nasal Cannula  Additional Equipment:   Intra-op Plan:   Post-operative Plan:   Informed Consent: I have reviewed the patients History and Physical, chart, labs and discussed the procedure  including the risks, benefits and alternatives for the proposed anesthesia with the patient or authorized representative who has indicated his/her understanding and acceptance.     Dental Advisory Given  Plan Discussed with: Anesthesiologist, CRNA and Surgeon  Anesthesia Plan Comments: (Patient consented for risks of anesthesia including but not limited to:  - adverse reactions to medications - risk of airway placement if required - damage to eyes, teeth, lips or other oral mucosa - nerve damage due to positioning  - sore throat or hoarseness - Damage to heart, brain, nerves, lungs, other parts of body or loss of life  Patient voiced understanding.)       Anesthesia Quick Evaluation

## 2022-03-31 NOTE — Transfer of Care (Signed)
Immediate Anesthesia Transfer of Care Note  Patient: April George  Procedure(s) Performed: COLONOSCOPY WITH PROPOFOL  Patient Location: PACU and Endoscopy Unit  Anesthesia Type:General  Level of Consciousness: drowsy and patient cooperative  Airway & Oxygen Therapy: Patient Spontanous Breathing  Post-op Assessment: Report given to RN and Post -op Vital signs reviewed and stable  Post vital signs: Reviewed and stable  Last Vitals:  Vitals Value Taken Time  BP 136/74 03/31/22 0935  Temp    Pulse 86 03/31/22 0935  Resp 15 03/31/22 0935  SpO2 99 % 03/31/22 0935    Last Pain:  Vitals:   03/31/22 0935  TempSrc:   PainSc: Asleep         Complications: No notable events documented.

## 2022-03-31 NOTE — Op Note (Signed)
[facility] Gastroenterology Patient Name: April George Procedure Date: 03/31/2022 9:06 AM MRN: [id number] Account #: [id number] Date of Birth: 1961-02-17 Admit Type: Outpatient Age: 62 Room: [facility] ENDO ROOM 2 Gender: Female Note Status: Finalized Instrument Name: April George [id number] Procedure:             Colonoscopy Indications:           Surveillance: Personal history of adenomatous polyps                         on last colonoscopy 3 years ago Providers:             Jonathon Bellows MD, MD Referring MD:          Jonetta Osgood (Referring MD) Medicines:             Monitored Anesthesia Care Complications:         No immediate complications. Procedure:             Pre-Anesthesia Assessment:                        - Prior to the procedure, a History and Physical was                         performed, and patient medications, allergies and                         sensitivities were reviewed. The patient's tolerance                         of previous anesthesia was reviewed.                        - The risks and benefits of the procedure and the                         sedation options and risks were discussed with the                         patient. All questions were answered and informed                         consent was obtained.                        - ASA Grade Assessment: II - A patient with mild                         systemic disease.                        After obtaining informed consent, the colonoscope was                         passed under direct vision. Throughout the procedure,                         the patient's blood pressure, pulse, and oxygen                         saturations were  monitored continuously. The                         Colonoscope was introduced through the anus and                         advanced to the the cecum, identified by the                         appendiceal orifice. The colonoscopy was performed                          with ease. The patient tolerated the procedure well.                         The quality of the bowel preparation was excellent. Findings:      The perianal and digital rectal examinations were normal.      The colon (entire examined portion) appeared normal.      The exam was otherwise without abnormality on direct and retroflexion       views. Impression:            - The entire examined colon is normal.                        - The examination was otherwise normal on direct and                         retroflexion views.                        - No specimens collected. Recommendation:        - Discharge patient to home (with escort).                        - Resume previous diet.                        - Continue present medications.                        - Repeat colonoscopy in 10 years for surveillance. Procedure Code(s):     --- Professional ---                        [phone], Colonoscopy, flexible; diagnostic, including                         collection of specimen(s) by brushing or washing, when                         performed (separate procedure) Diagnosis Code(s):     --- Professional ---                        Z86.010, Personal history of colonic polyps CPT copyright 2022 American Medical Association. All rights reserved. The codes documented in this report are preliminary and upon coder review may  be revised to meet current compliance requirements. Jonathon Bellows, MD Jonathon Bellows MD, MD 03/31/2022 9:34:14 AM This report has been signed electronically. Number of Addenda: 0 Note Initiated On:  03/31/2022 9:06 AM Scope Withdrawal Time: 0 hours 8 minutes 28 seconds  Total Procedure Duration: 0 hours 11 minutes 1 second  Estimated Blood Loss:  Estimated blood loss: none.      [facility]

## 2022-03-31 NOTE — Anesthesia Postprocedure Evaluation (Signed)
Anesthesia Post Note  Patient: April George  Procedure(s) Performed: COLONOSCOPY WITH PROPOFOL  Patient location during evaluation: Endoscopy Anesthesia Type: General Level of consciousness: awake and alert Pain management: pain level controlled Vital Signs Assessment: post-procedure vital signs reviewed and stable Respiratory status: spontaneous breathing, nonlabored ventilation, respiratory function stable and patient connected to nasal cannula oxygen Cardiovascular status: blood pressure returned to baseline and stable Postop Assessment: no apparent nausea or vomiting Anesthetic complications: no   No notable events documented.   Last Vitals:  Vitals:   03/31/22 0945 03/31/22 0955  BP: (!) 158/90 (!) 164/83  Pulse: 76 76  Resp: 20 16  Temp:    SpO2: 100% 100%    Last Pain:  Vitals:   03/31/22 0955  TempSrc:   PainSc: 0-No pain                 Precious Haws Gwynevere Lizana

## 2022-03-31 NOTE — H&P (Signed)
Jonathon Bellows, MD [address] [place], [place], [place], [address] [address], [place], [place], [place], [address] Phone: [phone]  Fax: [phone]  Primary Care Physician:  Jonetta Osgood, NP   Pre-Procedure History & Physical: HPI:  April George is a 62 y.o. female is here for an colonoscopy.   Past Medical History:  Diagnosis Date   Asthma    Hypertension     Past Surgical History:  Procedure Laterality Date   ABDOMINAL HYSTERECTOMY     CARPAL TUNNEL RELEASE Bilateral    COLONOSCOPY     COLONOSCOPY WITH PROPOFOL N/A 02/23/2019   Procedure: COLONOSCOPY WITH PROPOFOL;  Surgeon: Jonathon Bellows, MD;  Location: [facility] ENDOSCOPY;  Service: Gastroenterology;  Laterality: N/A;    Prior to Admission medications   Medication Sig Start Date End Date Taking? Authorizing Provider  amLODipine (NORVASC) 5 MG tablet Take 1 tablet (5 mg total) by mouth daily. 02/23/22  Yes Abernathy, Yetta Flock, NP  diclofenac (VOLTAREN) 75 MG EC tablet Take 1 tablet (75 mg total) by mouth 2 (two) times daily. 02/23/22  Yes Abernathy, Yetta Flock, NP  DULoxetine (CYMBALTA) 60 MG capsule Take 1 capsule (60 mg total) by mouth daily. 02/23/22  Yes Abernathy, Yetta Flock, NP  fluticasone (FLONASE) 50 MCG/ACT nasal spray Place 2 sprays into both nostrils daily. 02/18/21  Yes Abernathy, Yetta Flock, NP  furosemide (LASIX) 20 MG tablet Take 1 tablet (20 mg total) by mouth daily. 02/23/22  Yes Abernathy, Alyssa, NP  lisinopril (ZESTRIL) 20 MG tablet Take 2 tablets (40 mg total) by mouth daily. 02/23/22  Yes Abernathy, Alyssa, NP  albuterol (VENTOLIN HFA) 108 (90 Base) MCG/ACT inhaler Inhale 2 puffs into the lungs every 6 (six) hours as needed for wheezing or shortness of breath. 03/24/22   Jonetta Osgood, NP  busPIRone (BUSPAR) 5 MG tablet Take 1 tablet (5 mg total) by mouth 3 (three) times daily as needed (anxiety). 03/18/21   Jonetta Osgood, NP  desloratadine (CLARINEX REDITAB) 5 MG disintegrating tablet Take 1 tablet (5 mg  total) by mouth daily as needed. 02/18/21   Jonetta Osgood, NP  HYDROcodone-acetaminophen (NORCO/VICODIN) 5-325 MG tablet Take 1 tablet by mouth 2 (two) times daily as needed for moderate pain. 02/23/22   Jonetta Osgood, NP  phentermine (ADIPEX-P) 37.5 MG tablet Take 1 tablet (37.5 mg total) by mouth daily before breakfast. 03/24/22   Jonetta Osgood, NP    Allergies as of 02/26/2022 - Review Complete 02/24/2022  Allergen Reaction Noted   Dust mite extract  11/03/2020   Codeine Other (See Comments) 12/02/2014    Family History  Problem Relation Age of Onset   Breast cancer Mother 37   Breast cancer Cousin    Depression Sister    Anxiety disorder Sister     Social History   Socioeconomic History   Marital status: Married    Spouse name: keith   Number of children: 2   Years of education: Not on file   Highest education level: Associate degree: occupational, Hotel manager, or vocational program  Occupational History   Not on file  Tobacco Use   Smoking status: Never   Smokeless tobacco: Never  Vaping Use   Vaping Use: Never used  Substance and Sexual Activity   Alcohol use: No   Drug use: No   Sexual activity: Yes  Other Topics Concern   Not on file  Social History Narrative   Not on file   Social Determinants of Health   Financial Resource Strain: Low Risk  (07/12/2018)  Overall Financial Resource Strain (CARDIA)    Difficulty of Paying Living Expenses: Not hard at all  Food Insecurity: No Food Insecurity (07/12/2018)   Hunger Vital Sign    Worried About Running Out of Food in the Last Year: Never true    Ran Out of Food in the Last Year: Never true  Transportation Needs: No Transportation Needs (07/12/2018)   PRAPARE - Hydrologist (Medical): No    Lack of Transportation (Non-Medical): No  Physical Activity: Inactive (07/12/2018)   Exercise Vital Sign    Days of Exercise per Week: 0 days    Minutes of Exercise per Session: 0 min   Stress: No Stress Concern Present (07/12/2018)   [place]    Feeling of Stress : Not at all  Social Connections: Unknown (07/12/2018)   Social Connection and Isolation Panel [NHANES]    Frequency of Communication with Friends and Family: Not on file    Frequency of Social Gatherings with Friends and Family: Not on file    Attends Religious Services: More than 4 times per year    Active Member of [employer] or Organizations: No    Attends Archivist Meetings: Never    Marital Status: Married  Human resources officer Violence: Not At Risk (07/12/2018)   Humiliation, Afraid, Rape, and Kick questionnaire    Fear of Current or Ex-Partner: No    Emotionally Abused: No    Physically Abused: No    Sexually Abused: No    Review of Systems: See HPI, otherwise negative ROS  Physical Exam: BP (!) 149/69   Pulse 88   Temp (!) 97.2 F (36.2 C) (Temporal)   Resp 17   Ht '5\' 5"'$  (1.651 m)   Wt (!) 138.7 kg   SpO2 97%   BMI 50.89 kg/m  General:   Alert,  pleasant and cooperative in NAD Head:  Normocephalic and atraumatic. Neck:  Supple; no masses or thyromegaly. Lungs:  Clear throughout to auscultation, normal respiratory effort.    Heart:  +S1, +S2, Regular rate and rhythm, No edema. Abdomen:  Soft, nontender and nondistended. Normal bowel sounds, without guarding, and without rebound.   Neurologic:  Alert and  oriented x4;  grossly normal neurologically.  Impression/Plan: April George is here for an colonoscopy to be performed for surveillance due to prior history of colon polyps   Risks, benefits, limitations, and alternatives regarding  colonoscopy have been reviewed with the patient.  Questions have been answered.  All parties agreeable.   Jonathon Bellows, MD  03/31/2022, 9:01 AM

## 2022-04-01 ENCOUNTER — Encounter: Payer: Self-pay | Admitting: Gastroenterology

## 2022-04-07 ENCOUNTER — Other Ambulatory Visit: Payer: Self-pay

## 2022-04-07 DIAGNOSIS — R0602 Shortness of breath: Secondary | ICD-10-CM

## 2022-04-09 ENCOUNTER — Telehealth: Payer: Self-pay | Admitting: Internal Medicine

## 2022-04-09 NOTE — Telephone Encounter (Signed)
Left vm and sent mychart message to confirm 04/15/22 appointment-Toni

## 2022-04-14 ENCOUNTER — Ambulatory Visit
Admission: RE | Admit: 2022-04-14 | Discharge: 2022-04-14 | Disposition: A | Discharge status: Home / Self Care | Payer: Managed Care, Other (non HMO) | Source: Ambulatory Visit | Attending: Nurse Practitioner | Admitting: Nurse Practitioner

## 2022-04-14 DIAGNOSIS — Z1231 Encounter for screening mammogram for malignant neoplasm of breast: Secondary | ICD-10-CM | POA: Diagnosis not present

## 2022-04-15 ENCOUNTER — Ambulatory Visit: Payer: Managed Care, Other (non HMO) | Admitting: Internal Medicine

## 2022-04-21 ENCOUNTER — Ambulatory Visit: Payer: Managed Care, Other (non HMO) | Admitting: Nurse Practitioner

## 2022-04-21 ENCOUNTER — Encounter: Payer: Self-pay | Admitting: Nurse Practitioner

## 2022-04-21 VITALS — BP 140/82 | HR 85 | Temp 98.3°F | Resp 16 | Ht 65.0 in | Wt 310.2 lb

## 2022-04-21 DIAGNOSIS — M5441 Lumbago with sciatica, right side: Secondary | ICD-10-CM

## 2022-04-21 DIAGNOSIS — I10 Essential (primary) hypertension: Secondary | ICD-10-CM | POA: Diagnosis not present

## 2022-04-21 DIAGNOSIS — G8929 Other chronic pain: Secondary | ICD-10-CM

## 2022-04-21 DIAGNOSIS — Z6841 Body Mass Index (BMI) 40.0 and over, adult: Secondary | ICD-10-CM

## 2022-04-21 DIAGNOSIS — Z760 Encounter for issue of repeat prescription: Secondary | ICD-10-CM

## 2022-04-21 MED ORDER — PHENTERMINE HCL 37.5 MG PO TABS: 37.5000 mg | ORAL_TABLET | Freq: Every day | ORAL | 0 refills | Status: DC

## 2022-04-21 MED ORDER — METHOCARBAMOL 500 MG PO TABS: 500.0000 mg | ORAL_TABLET | Freq: Four times a day (QID) | ORAL | 1 refills | Status: DC | PRN

## 2022-04-21 NOTE — Progress Notes (Unsigned)
[facility] [place], [place] [address]  Internal MEDICINE  Office Visit Note  Patient Name: April George  [id number]  [id number]  Date of Service: 04/21/2022  Chief Complaint  Patient presents with   Follow-up    Weight loss    Hypertension    HPI Persais presents for a follow-up visit for  Recently went back to work, has been sleeping a lot more lately. Work has been very tiring.  Elevated BP, rechecked.  Med refills     Current Medication: Outpatient Encounter Medications as of 04/21/2022  Medication Sig   albuterol (VENTOLIN HFA) 108 (90 Base) MCG/ACT inhaler Inhale 2 puffs into the lungs every 6 (six) hours as needed for wheezing or shortness of breath.   amLODipine (NORVASC) 5 MG tablet Take 1 tablet (5 mg total) by mouth daily.   busPIRone (BUSPAR) 5 MG tablet Take 1 tablet (5 mg total) by mouth 3 (three) times daily as needed (anxiety).   desloratadine (CLARINEX REDITAB) 5 MG disintegrating tablet Take 1 tablet (5 mg total) by mouth daily as needed.   diclofenac (VOLTAREN) 75 MG EC tablet Take 1 tablet (75 mg total) by mouth 2 (two) times daily.   DULoxetine (CYMBALTA) 60 MG capsule Take 1 capsule (60 mg total) by mouth daily.   fluticasone (FLONASE) 50 MCG/ACT nasal spray Place 2 sprays into both nostrils daily.   furosemide (LASIX) 20 MG tablet Take 1 tablet (20 mg total) by mouth daily.   HYDROcodone-acetaminophen (NORCO/VICODIN) 5-325 MG tablet Take 1 tablet by mouth 2 (two) times daily as needed for moderate pain.   lisinopril (ZESTRIL) 20 MG tablet Take 2 tablets (40 mg total) by mouth daily.   methocarbamol (ROBAXIN) 500 MG tablet Take 1 tablet (500 mg total) by mouth every 6 (six) hours as needed for muscle spasms.   [DISCONTINUED] phentermine (ADIPEX-P) 37.5 MG tablet Take 1 tablet (37.5 mg total) by mouth daily before breakfast.   phentermine (ADIPEX-P) 37.5 MG tablet Take 1 tablet (37.5 mg total) by mouth daily before breakfast.   No  facility-administered encounter medications on file as of 04/21/2022.    Surgical History: Past Surgical History:  Procedure Laterality Date   ABDOMINAL HYSTERECTOMY     CARPAL TUNNEL RELEASE Bilateral    COLONOSCOPY     COLONOSCOPY WITH PROPOFOL N/A 02/23/2019   Procedure: COLONOSCOPY WITH PROPOFOL;  Surgeon: Jonathon Bellows, MD;  Location: [facility] ENDOSCOPY;  Service: Gastroenterology;  Laterality: N/A;   COLONOSCOPY WITH PROPOFOL N/A 03/31/2022   Procedure: COLONOSCOPY WITH PROPOFOL;  Surgeon: Jonathon Bellows, MD;  Location: [facility] ENDOSCOPY;  Service: Gastroenterology;  Laterality: N/A;    Medical History: Past Medical History:  Diagnosis Date   Asthma    Hypertension     Family History: Family History  Problem Relation Age of Onset   Breast cancer Mother 56   Breast cancer Cousin    Depression Sister    Anxiety disorder Sister     Social History   Socioeconomic History   Marital status: Married    Spouse name: keith   Number of children: 2   Years of education: Not on file   Highest education level: Associate degree: occupational, Hotel manager, or vocational program  Occupational History   Not on file  Tobacco Use   Smoking status: Never   Smokeless tobacco: Never  Vaping Use   Vaping Use: Never used  Substance and Sexual Activity   Alcohol use: No   Drug use: No   Sexual  activity: Yes  Other Topics Concern   Not on file  Social History Narrative   Not on file   Social Determinants of Health   Financial Resource Strain: Low Risk  (07/12/2018)   Overall Financial Resource Strain (CARDIA)    Difficulty of Paying Living Expenses: Not hard at all  Food Insecurity: No Food Insecurity (07/12/2018)   Hunger Vital Sign    Worried About Running Out of Food in the Last Year: Never true    Ran Out of Food in the Last Year: Never true  Transportation Needs: No Transportation Needs (07/12/2018)   PRAPARE - Hydrologist (Medical): No    Lack of  Transportation (Non-Medical): No  Physical Activity: Inactive (07/12/2018)   Exercise Vital Sign    Days of Exercise per Week: 0 days    Minutes of Exercise per Session: 0 min  Stress: No Stress Concern Present (07/12/2018)   [place]    Feeling of Stress : Not at all  Social Connections: Unknown (07/12/2018)   Social Connection and Isolation Panel [NHANES]    Frequency of Communication with Friends and Family: Not on file    Frequency of Social Gatherings with Friends and Family: Not on file    Attends Religious Services: More than 4 times per year    Active Member of [employer] or Organizations: No    Attends Archivist Meetings: Never    Marital Status: Married  Human resources officer Violence: Not At Risk (07/12/2018)   Humiliation, Afraid, Rape, and Kick questionnaire    Fear of Current or Ex-Partner: No    Emotionally Abused: No    Physically Abused: No    Sexually Abused: No      Review of Systems  Vital Signs: BP (!) 140/82 Comment: 162/85  Pulse 85   Temp 98.3 F (36.8 C)   Resp 16   Ht '5\' 5"'$  (1.651 m)   Wt (!) 310 lb 3.2 oz (140.7 kg)   SpO2 97%   BMI 51.62 kg/m    Physical Exam     Assessment/Plan:   General Counseling: Hamda verbalizes understanding of the findings of todays visit and agrees with plan of treatment. I have discussed any further diagnostic evaluation that may be needed or ordered today. We also reviewed her medications today. she has been encouraged to call the office with any questions or concerns that should arise related to todays visit.    No orders of the defined types were placed in this encounter.   Meds ordered this encounter  Medications   methocarbamol (ROBAXIN) 500 MG tablet    Sig: Take 1 tablet (500 mg total) by mouth every 6 (six) hours as needed for muscle spasms.    Dispense:  90 tablet    Refill:  1    New script, fill today.   phentermine (ADIPEX-P)  37.5 MG tablet    Sig: Take 1 tablet (37.5 mg total) by mouth daily before breakfast.    Dispense:  30 tablet    Refill:  0    Fill with good rx discount    Return in about 4 weeks (around 05/19/2022) for F/U, Weight loss, Dantae Meunier PCP.   Total time spent:*** Minutes Time spent includes review of chart, medications, test results, and follow up plan with the patient.   [place] Controlled Substance Database was reviewed by me.  This patient was seen by Jonetta Osgood, FNP-C in collaboration with  Dr. Clayborn Bigness as a part of collaborative care agreement.   Ambrie Carte R. Valetta Fuller, MSN, FNP-C Internal medicine

## 2022-04-22 ENCOUNTER — Encounter: Payer: Self-pay | Admitting: Nurse Practitioner

## 2022-04-29 ENCOUNTER — Ambulatory Visit: Payer: Managed Care, Other (non HMO) | Admitting: Internal Medicine

## 2022-05-19 ENCOUNTER — Ambulatory Visit (INDEPENDENT_AMBULATORY_CARE_PROVIDER_SITE_OTHER): Payer: Managed Care, Other (non HMO) | Admitting: Nurse Practitioner

## 2022-05-19 ENCOUNTER — Encounter: Payer: Self-pay | Admitting: Nurse Practitioner

## 2022-05-19 VITALS — BP 140/88 | HR 97 | Temp 98.4°F | Resp 16 | Ht 65.0 in | Wt 303.0 lb

## 2022-05-19 DIAGNOSIS — Z6841 Body Mass Index (BMI) 40.0 and over, adult: Secondary | ICD-10-CM

## 2022-05-19 DIAGNOSIS — M5441 Lumbago with sciatica, right side: Secondary | ICD-10-CM

## 2022-05-19 DIAGNOSIS — F3341 Major depressive disorder, recurrent, in partial remission: Secondary | ICD-10-CM

## 2022-05-19 DIAGNOSIS — G8929 Other chronic pain: Secondary | ICD-10-CM

## 2022-05-19 DIAGNOSIS — Z760 Encounter for issue of repeat prescription: Secondary | ICD-10-CM

## 2022-05-19 MED ORDER — METHOCARBAMOL 500 MG PO TABS: 500.0000 mg | ORAL_TABLET | Freq: Four times a day (QID) | ORAL | 1 refills | Status: DC | PRN

## 2022-05-19 MED ORDER — PHENTERMINE HCL 37.5 MG PO TABS: 37.5000 mg | ORAL_TABLET | Freq: Every day | ORAL | 0 refills | Status: DC

## 2022-05-19 MED ORDER — DULOXETINE HCL 60 MG PO CPEP: 60.0000 mg | ORAL_CAPSULE | Freq: Every day | ORAL | 11 refills | Status: DC

## 2022-05-19 MED ORDER — HYDROCODONE-ACETAMINOPHEN 5-325 MG PO TABS: 1.0000 | ORAL_TABLET | Freq: Two times a day (BID) | ORAL | 0 refills | Status: DC | PRN

## 2022-05-19 NOTE — Progress Notes (Signed)
[facility] [place], [place] [address]  Internal MEDICINE  Office Visit Note  Patient Name: April George  [id number]  [id number]  Date of Service: 05/19/2022  Chief Complaint  Patient presents with   Follow-up    HPI Kaaliyah presents for a follow-up visit for weight loss and muscle/leg pains  Muscle and leg pains are improving --lost 7 lbs  Wants to Continue phentermine Stressed out, a lot of drama at home but handling well.  Getting used to being back at work.     Current Medication: Outpatient Encounter Medications as of 05/19/2022  Medication Sig   albuterol (VENTOLIN HFA) 108 (90 Base) MCG/ACT inhaler Inhale 2 puffs into the lungs every 6 (six) hours as needed for wheezing or shortness of breath.   amLODipine (NORVASC) 5 MG tablet Take 1 tablet (5 mg total) by mouth daily.   busPIRone (BUSPAR) 5 MG tablet Take 1 tablet (5 mg total) by mouth 3 (three) times daily as needed (anxiety).   desloratadine (CLARINEX REDITAB) 5 MG disintegrating tablet Take 1 tablet (5 mg total) by mouth daily as needed.   diclofenac (VOLTAREN) 75 MG EC tablet Take 1 tablet (75 mg total) by mouth 2 (two) times daily.   fluticasone (FLONASE) 50 MCG/ACT nasal spray Place 2 sprays into both nostrils daily.   furosemide (LASIX) 20 MG tablet Take 1 tablet (20 mg total) by mouth daily.   lisinopril (ZESTRIL) 20 MG tablet Take 2 tablets (40 mg total) by mouth daily.   [DISCONTINUED] DULoxetine (CYMBALTA) 60 MG capsule Take 1 capsule (60 mg total) by mouth daily.   [DISCONTINUED] HYDROcodone-acetaminophen (NORCO/VICODIN) 5-325 MG tablet Take 1 tablet by mouth 2 (two) times daily as needed for moderate pain.   [DISCONTINUED] methocarbamol (ROBAXIN) 500 MG tablet Take 1 tablet (500 mg total) by mouth every 6 (six) hours as needed for muscle spasms.   [DISCONTINUED] phentermine (ADIPEX-P) 37.5 MG tablet Take 1 tablet (37.5 mg total) by mouth daily before breakfast.   DULoxetine (CYMBALTA)  60 MG capsule Take 1 capsule (60 mg total) by mouth daily.   HYDROcodone-acetaminophen (NORCO/VICODIN) 5-325 MG tablet Take 1 tablet by mouth 2 (two) times daily as needed for moderate pain.   methocarbamol (ROBAXIN) 500 MG tablet Take 1 tablet (500 mg total) by mouth every 6 (six) hours as needed for muscle spasms.   phentermine (ADIPEX-P) 37.5 MG tablet Take 1 tablet (37.5 mg total) by mouth daily before breakfast.   No facility-administered encounter medications on file as of 05/19/2022.    Surgical History: Past Surgical History:  Procedure Laterality Date   ABDOMINAL HYSTERECTOMY     CARPAL TUNNEL RELEASE Bilateral    COLONOSCOPY     COLONOSCOPY WITH PROPOFOL N/A 02/23/2019   Procedure: COLONOSCOPY WITH PROPOFOL;  Surgeon: Jonathon Bellows, MD;  Location: [facility] ENDOSCOPY;  Service: Gastroenterology;  Laterality: N/A;   COLONOSCOPY WITH PROPOFOL N/A 03/31/2022   Procedure: COLONOSCOPY WITH PROPOFOL;  Surgeon: Jonathon Bellows, MD;  Location: [facility] ENDOSCOPY;  Service: Gastroenterology;  Laterality: N/A;    Medical History: Past Medical History:  Diagnosis Date   Asthma    Hypertension     Family History: Family History  Problem Relation Age of Onset   Breast cancer Mother 42   Breast cancer Cousin    Depression Sister    Anxiety disorder Sister     Social History   Socioeconomic History   Marital status: Married    Spouse name: keith   Number of children:  2   Years of education: Not on file   Highest education level: Associate degree: occupational, Hotel manager, or vocational program  Occupational History   Not on file  Tobacco Use   Smoking status: Never   Smokeless tobacco: Never  Vaping Use   Vaping Use: Never used  Substance and Sexual Activity   Alcohol use: No   Drug use: No   Sexual activity: Yes  Other Topics Concern   Not on file  Social History Narrative   Not on file   Social Determinants of Health   Financial Resource Strain: Low Risk  (07/12/2018)   Overall  Financial Resource Strain (CARDIA)    Difficulty of Paying Living Expenses: Not hard at all  Food Insecurity: No Food Insecurity (07/12/2018)   Hunger Vital Sign    Worried About Running Out of Food in the Last Year: Never true    Ran Out of Food in the Last Year: Never true  Transportation Needs: No Transportation Needs (07/12/2018)   PRAPARE - Hydrologist (Medical): No    Lack of Transportation (Non-Medical): No  Physical Activity: Inactive (07/12/2018)   Exercise Vital Sign    Days of Exercise per Week: 0 days    Minutes of Exercise per Session: 0 min  Stress: No Stress Concern Present (07/12/2018)   [place]    Feeling of Stress : Not at all  Social Connections: Unknown (07/12/2018)   Social Connection and Isolation Panel [NHANES]    Frequency of Communication with Friends and Family: Not on file    Frequency of Social Gatherings with Friends and Family: Not on file    Attends Religious Services: More than 4 times per year    Active Member of [employer] or Organizations: No    Attends Archivist Meetings: Never    Marital Status: Married  Human resources officer Violence: Not At Risk (07/12/2018)   Humiliation, Afraid, Rape, and Kick questionnaire    Fear of Current or Ex-Partner: No    Emotionally Abused: No    Physically Abused: No    Sexually Abused: No      Review of Systems  Constitutional:  Positive for fatigue and unexpected weight change. Negative for chills.  HENT:  Negative for congestion, rhinorrhea, sneezing and sore throat.   Eyes:  Negative for redness.  Respiratory: Negative.  Negative for cough, chest tightness, shortness of breath and wheezing.   Cardiovascular: Negative.  Negative for chest pain and palpitations.  Gastrointestinal:  Negative for abdominal pain, constipation, diarrhea, nausea and vomiting.  Genitourinary:  Negative for dysuria and frequency.   Musculoskeletal:  Negative for arthralgias, back pain, joint swelling and neck pain.  Skin:  Negative for rash.  Neurological: Negative.  Negative for tremors and numbness.  Hematological:  Negative for adenopathy. Does not bruise/bleed easily.  Psychiatric/Behavioral:  Positive for behavioral problems (Depression). Negative for self-injury, sleep disturbance and suicidal ideas. The patient is nervous/anxious.        Hypersomnia    Vital Signs: BP (!) 140/88 Comment: 151/87  Pulse 97   Temp 98.4 F (36.9 C)   Resp 16   Ht '5\' 5"'$  (1.651 m)   Wt (!) 303 lb (137.4 kg)   SpO2 96%   BMI 50.42 kg/m    Physical Exam Vitals reviewed.  Constitutional:      General: She is not in acute distress.    Appearance: Normal appearance. She is obese. She  is not ill-appearing.  HENT:     Head: Normocephalic and atraumatic.  Eyes:     Pupils: Pupils are equal, round, and reactive to light.  Cardiovascular:     Rate and Rhythm: Normal rate and regular rhythm.  Pulmonary:     Effort: Pulmonary effort is normal. No respiratory distress.  Neurological:     Mental Status: She is alert and oriented to person, place, and time.     Gait: Gait normal.  Psychiatric:        Mood and Affect: Mood normal.        Behavior: Behavior normal.        Assessment/Plan: 1. Chronic right-sided low back pain with right-sided sciatica Continue prn methocarbamol and hydrocodone as prescribed.  - methocarbamol (ROBAXIN) 500 MG tablet; Take 1 tablet (500 mg total) by mouth every 6 (six) hours as needed for muscle spasms.  Dispense: 90 tablet; Refill: 1 - HYDROcodone-acetaminophen (NORCO/VICODIN) 5-325 MG tablet; Take 1 tablet by mouth 2 (two) times daily as needed for moderate pain.  Dispense: 30 tablet; Refill: 0  2. Class 3 severe obesity due to excess calories with serious comorbidity and body mass index (BMI) of 45.0 to 49.9 in adult [facility]) Lost 7 lbs, continue phentermine for another month, follow up in 4  weeks.  - phentermine (ADIPEX-P) 37.5 MG tablet; Take 1 tablet (37.5 mg total) by mouth daily before breakfast.  Dispense: 30 tablet; Refill: 0  3. Recurrent major depression in partial remission (HCC) Continue duloxetine as prescribed.  - DULoxetine (CYMBALTA) 60 MG capsule; Take 1 capsule (60 mg total) by mouth daily.  Dispense: 30 capsule; Refill: 11   General Counseling: Marlayna verbalizes understanding of the findings of todays visit and agrees with plan of treatment. I have discussed any further diagnostic evaluation that may be needed or ordered today. We also reviewed her medications today. she has been encouraged to call the office with any questions or concerns that should arise related to todays visit.    No orders of the defined types were placed in this encounter.   Meds ordered this encounter  Medications   methocarbamol (ROBAXIN) 500 MG tablet    Sig: Take 1 tablet (500 mg total) by mouth every 6 (six) hours as needed for muscle spasms.    Dispense:  90 tablet    Refill:  1    New script, fill today.   DULoxetine (CYMBALTA) 60 MG capsule    Sig: Take 1 capsule (60 mg total) by mouth daily.    Dispense:  30 capsule    Refill:  11    Change prescription to monthly please.   HYDROcodone-acetaminophen (NORCO/VICODIN) 5-325 MG tablet    Sig: Take 1 tablet by mouth 2 (two) times daily as needed for moderate pain.    Dispense:  30 tablet    Refill:  0    refill   phentermine (ADIPEX-P) 37.5 MG tablet    Sig: Take 1 tablet (37.5 mg total) by mouth daily before breakfast.    Dispense:  30 tablet    Refill:  0    Fill with good rx discount    Return in about 4 weeks (around 06/16/2022) for F/U, Weight loss, Abdulrahim Siddiqi PCP.   Total time spent:30 Minutes Time spent includes review of chart, medications, test results, and follow up plan with the patient.   [place] Controlled Substance Database was reviewed by me.  This patient was seen by Jonetta Osgood, FNP-C in collaboration  with Dr.  Clayborn Bigness as a part of collaborative care agreement.   Linkin Vizzini R. Valetta Fuller, MSN, FNP-C Internal medicine

## 2022-06-01 ENCOUNTER — Encounter: Payer: Self-pay | Admitting: Nurse Practitioner

## 2022-06-24 ENCOUNTER — Encounter: Payer: Self-pay | Admitting: Nurse Practitioner

## 2022-06-24 ENCOUNTER — Ambulatory Visit: Payer: Managed Care, Other (non HMO) | Admitting: Nurse Practitioner

## 2022-06-24 VITALS — BP 140/82 | HR 100 | Temp 97.8°F | Resp 16 | Ht 65.0 in | Wt 301.0 lb

## 2022-06-24 DIAGNOSIS — Z23 Encounter for immunization: Secondary | ICD-10-CM

## 2022-06-24 DIAGNOSIS — I10 Essential (primary) hypertension: Secondary | ICD-10-CM

## 2022-06-24 DIAGNOSIS — Z6841 Body Mass Index (BMI) 40.0 and over, adult: Secondary | ICD-10-CM

## 2022-06-24 DIAGNOSIS — M5441 Lumbago with sciatica, right side: Secondary | ICD-10-CM

## 2022-06-24 DIAGNOSIS — G8929 Other chronic pain: Secondary | ICD-10-CM

## 2022-06-24 DIAGNOSIS — Z9189 Other specified personal risk factors, not elsewhere classified: Secondary | ICD-10-CM | POA: Diagnosis not present

## 2022-06-24 MED ORDER — SEMAGLUTIDE-WEIGHT MANAGEMENT 0.25 MG/0.5ML ~~LOC~~ SOAJ: 0.2500 mg | SUBCUTANEOUS | 1 refills | Status: DC

## 2022-06-24 MED ORDER — PHENTERMINE HCL 37.5 MG PO TABS: 37.5000 mg | ORAL_TABLET | Freq: Every day | ORAL | 0 refills | Status: DC

## 2022-06-24 NOTE — Progress Notes (Signed)
[facility] [address] [place], [place] [address]  Internal MEDICINE  Office Visit Note  Patient Name: April George  [id number]  [id number]  Date of Service: 06/24/2022  Chief Complaint  Patient presents with   Hypertension   Follow-up    Weight loss     HPI April George presents for a follow-up visit for weight loss management, hypertension and back pain.  Elevated BP -- improved when rechecked Weight loss -- lost 2 more lbs. Lays down a lot due to pain, if pain is controlled, she may be able to active Back pain and knees hurt Getting surgery on both hands again    Current Medication: Outpatient Encounter Medications as of 06/24/2022  Medication Sig   albuterol (VENTOLIN HFA) 108 (90 Base) MCG/ACT inhaler Inhale 2 puffs into the lungs every 6 (six) hours as needed for wheezing or shortness of breath.   amLODipine (NORVASC) 5 MG tablet Take 1 tablet (5 mg total) by mouth daily.   busPIRone (BUSPAR) 5 MG tablet Take 1 tablet (5 mg total) by mouth 3 (three) times daily as needed (anxiety).   desloratadine (CLARINEX REDITAB) 5 MG disintegrating tablet Take 1 tablet (5 mg total) by mouth daily as needed.   diclofenac (VOLTAREN) 75 MG EC tablet Take 1 tablet (75 mg total) by mouth 2 (two) times daily.   DULoxetine (CYMBALTA) 60 MG capsule Take 1 capsule (60 mg total) by mouth daily.   fluticasone (FLONASE) 50 MCG/ACT nasal spray Place 2 sprays into both nostrils daily.   furosemide (LASIX) 20 MG tablet Take 1 tablet (20 mg total) by mouth daily.   HYDROcodone-acetaminophen (NORCO/VICODIN) 5-325 MG tablet Take 1 tablet by mouth 2 (two) times daily as needed for moderate pain.   lisinopril (ZESTRIL) 20 MG tablet Take 2 tablets (40 mg total) by mouth daily.   methocarbamol (ROBAXIN) 500 MG tablet Take 1 tablet (500 mg total) by mouth every 6 (six) hours as needed for muscle spasms.   Semaglutide-Weight Management 0.25 MG/0.5ML SOAJ Inject 0.25 mg into the skin once a week.    [DISCONTINUED] phentermine (ADIPEX-P) 37.5 MG tablet Take 1 tablet (37.5 mg total) by mouth daily before breakfast.   phentermine (ADIPEX-P) 37.5 MG tablet Take 1 tablet (37.5 mg total) by mouth daily before breakfast.   No facility-administered encounter medications on file as of 06/24/2022.    Surgical History: Past Surgical History:  Procedure Laterality Date   ABDOMINAL HYSTERECTOMY     CARPAL TUNNEL RELEASE Bilateral    COLONOSCOPY     COLONOSCOPY WITH PROPOFOL N/A 02/23/2019   Procedure: COLONOSCOPY WITH PROPOFOL;  Surgeon: Wyline Mood, MD;  Location: [facility] ENDOSCOPY;  Service: Gastroenterology;  Laterality: N/A;   COLONOSCOPY WITH PROPOFOL N/A 03/31/2022   Procedure: COLONOSCOPY WITH PROPOFOL;  Surgeon: Wyline Mood, MD;  Location: [facility] ENDOSCOPY;  Service: Gastroenterology;  Laterality: N/A;    Medical History: Past Medical History:  Diagnosis Date   Asthma    Hypertension     Family History: Family History  Problem Relation Age of Onset   Breast cancer Mother 36   Breast cancer Cousin    Depression Sister    Anxiety disorder Sister     Social History   Socioeconomic History   Marital status: Married    Spouse name: keith   Number of children: 2   Years of education: Not on file   Highest education level: Associate degree: occupational, Scientist, product/process development, or vocational program  Occupational History   Not on file  Tobacco Use   Smoking status: Never   Smokeless tobacco: Never  Vaping Use   Vaping Use: Never used  Substance and Sexual Activity   Alcohol use: No   Drug use: No   Sexual activity: Yes  Other Topics Concern   Not on file  Social History Narrative   Not on file   Social Determinants of Health   Financial Resource Strain: Low Risk  (07/12/2018)   Overall Financial Resource Strain (CARDIA)    Difficulty of Paying Living Expenses: Not hard at all  Food Insecurity: No Food Insecurity (07/12/2018)   Hunger Vital Sign    Worried About Running Out of Food in  the Last Year: Never true    Ran Out of Food in the Last Year: Never true  Transportation Needs: No Transportation Needs (07/12/2018)   PRAPARE - Administrator, Civil Service (Medical): No    Lack of Transportation (Non-Medical): No  Physical Activity: Inactive (07/12/2018)   Exercise Vital Sign    Days of Exercise per Week: 0 days    Minutes of Exercise per Session: 0 min  Stress: No Stress Concern Present (07/12/2018)   [employer] of Occupational Health - Occupational Stress Questionnaire    Feeling of Stress : Not at all  Social Connections: Unknown (07/12/2018)   Social Connection and Isolation Panel [NHANES]    Frequency of Communication with Friends and Family: Not on file    Frequency of Social Gatherings with Friends and Family: Not on file    Attends Religious Services: More than 4 times per year    Active Member of [employer] or Organizations: No    Attends Banker Meetings: Never    Marital Status: Married  Catering manager Violence: Not At Risk (07/12/2018)   Humiliation, Afraid, Rape, and Kick questionnaire    Fear of Current or Ex-Partner: No    Emotionally Abused: No    Physically Abused: No    Sexually Abused: No      Review of Systems  Constitutional:  Positive for fatigue and unexpected weight change. Negative for chills.  HENT:  Negative for congestion, rhinorrhea, sneezing and sore throat.   Eyes:  Negative for redness.  Respiratory: Negative.  Negative for cough, chest tightness, shortness of breath and wheezing.   Cardiovascular: Negative.  Negative for chest pain and palpitations.  Gastrointestinal:  Negative for abdominal pain, constipation, diarrhea, nausea and vomiting.  Genitourinary:  Negative for dysuria and frequency.  Musculoskeletal:  Negative for arthralgias, back pain, joint swelling and neck pain.  Skin:  Negative for rash.  Neurological: Negative.  Negative for tremors and numbness.  Hematological:  Negative for  adenopathy. Does not bruise/bleed easily.  Psychiatric/Behavioral:  Positive for behavioral problems (Depression). Negative for self-injury, sleep disturbance and suicidal ideas. The patient is nervous/anxious.        Hypersomnia    Vital Signs: BP (!) 140/82 Comment: 192/89  Pulse 100   Temp 97.8 F (36.6 C)   Resp 16   Ht 5\' 5"  (1.651 m)   Wt (!) 301 lb (136.5 kg)   SpO2 94%   BMI 50.09 kg/m    Physical Exam Vitals reviewed.  Constitutional:      General: She is not in acute distress.    Appearance: Normal appearance. She is obese. She is not ill-appearing.  HENT:     Head: Normocephalic and atraumatic.  Eyes:     Pupils: Pupils are equal, round, and reactive to light.  Cardiovascular:  Rate and Rhythm: Normal rate and regular rhythm.  Pulmonary:     Effort: Pulmonary effort is normal. No respiratory distress.  Neurological:     Mental Status: She is alert and oriented to person, place, and time.     Gait: Gait normal.  Psychiatric:        Mood and Affect: Mood normal.        Behavior: Behavior normal.        Assessment/Plan: 1. Primary hypertension Reginal Lutes will help with decreasing cardiac risk and weight loss which will help improve blood pressure  - Semaglutide-Weight Management 0.25 MG/0.5ML SOAJ; Inject 0.25 mg into the skin once a week.  Dispense: 2 mL; Refill: 1  2. Chronic right-sided low back pain with right-sided sciatica Weight loss will help alleviate her back pain   3. Class 3 severe obesity due to excess calories with serious comorbidity and body mass index (BMI) of 45.0 to 49.9 in adult Lost 2 lbs, current weight 301 lbs. Continue phentermine as prescribed while waiting for prior authorization for wegovy.  - Semaglutide-Weight Management 0.25 MG/0.5ML SOAJ; Inject 0.25 mg into the skin once a week.  Dispense: 2 mL; Refill: 1 - phentermine (ADIPEX-P) 37.5 MG tablet; Take 1 tablet (37.5 mg total) by mouth daily before breakfast.  Dispense: 30  tablet; Refill: 0  4. At increased risk for cardiovascular disease Wegovy prescribed, waiting prior authorization - Semaglutide-Weight Management 0.25 MG/0.5ML SOAJ; Inject 0.25 mg into the skin once a week.  Dispense: 2 mL; Refill: 1  5. Need for vaccination - Tdap (BOOSTRIX) 5-2.5-18.5 LF-MCG/0.5 injection; Inject 0.5 mLs into the muscle once for 1 dose.  Dispense: 0.5 mL; Refill: 0 - Zoster Vaccine Adjuvanted [facility]) injection; Inject 0.5 mLs into the muscle once for 1 dose.  Dispense: 0.5 mL; Refill: 0   General Counseling: Payslie verbalizes understanding of the findings of todays visit and agrees with plan of treatment. I have discussed any further diagnostic evaluation that may be needed or ordered today. We also reviewed her medications today. she has been encouraged to call the office with any questions or concerns that should arise related to todays visit.    No orders of the defined types were placed in this encounter.   Meds ordered this encounter  Medications   Semaglutide-Weight Management 0.25 MG/0.5ML SOAJ    Sig: Inject 0.25 mg into the skin once a week.    Dispense:  2 mL    Refill:  1    Dx code E66.01, Z91.89   phentermine (ADIPEX-P) 37.5 MG tablet    Sig: Take 1 tablet (37.5 mg total) by mouth daily before breakfast.    Dispense:  30 tablet    Refill:  0    Fill with good rx discount    Return in about 4 weeks (around 07/22/2022) for F/U, Weight loss, Jazzy Parmer PCP.   Total time spent:30 Minutes Time spent includes review of chart, medications, test results, and follow up plan with the patient.   [place] Controlled Substance Database was reviewed by me.  This patient was seen by Sallyanne Kuster, FNP-C in collaboration with Dr. Beverely Risen as a part of collaborative care agreement.   Taneya Conkel R. Tedd Sias, MSN, FNP-C Internal medicine

## 2022-06-26 ENCOUNTER — Telehealth: Payer: Self-pay

## 2022-06-26 NOTE — Telephone Encounter (Signed)
Completed P.A. for patient's Ozempic,

## 2022-07-04 MED ORDER — ZOSTER VAC RECOMB ADJUVANTED 50 MCG/0.5ML IM SUSR: 0.5000 mL | Freq: Once | INTRAMUSCULAR | 0 refills | Status: AC

## 2022-07-04 MED ORDER — TETANUS-DIPHTH-ACELL PERTUSSIS 5-2.5-18.5 LF-MCG/0.5 IM SUSP: 0.5000 mL | Freq: Once | INTRAMUSCULAR | 0 refills | Status: AC

## 2022-07-11 ENCOUNTER — Encounter: Payer: Self-pay | Admitting: Nurse Practitioner

## 2022-07-22 ENCOUNTER — Ambulatory Visit: Payer: Managed Care, Other (non HMO) | Admitting: Nurse Practitioner

## 2022-07-22 ENCOUNTER — Encounter: Payer: Self-pay | Admitting: Nurse Practitioner

## 2022-07-22 VITALS — BP 138/78 | HR 91 | Temp 97.6°F | Resp 16 | Ht 65.0 in | Wt 305.2 lb

## 2022-07-22 DIAGNOSIS — L989 Disorder of the skin and subcutaneous tissue, unspecified: Secondary | ICD-10-CM | POA: Diagnosis not present

## 2022-07-22 DIAGNOSIS — I10 Essential (primary) hypertension: Secondary | ICD-10-CM | POA: Diagnosis not present

## 2022-07-22 DIAGNOSIS — Z9189 Other specified personal risk factors, not elsewhere classified: Secondary | ICD-10-CM

## 2022-07-22 DIAGNOSIS — Z6841 Body Mass Index (BMI) 40.0 and over, adult: Secondary | ICD-10-CM

## 2022-07-22 MED ORDER — WEGOVY 0.5 MG/0.5ML ~~LOC~~ SOAJ: 0.5000 mg | SUBCUTANEOUS | 3 refills | Status: DC

## 2022-07-22 NOTE — Progress Notes (Signed)
[facility] [address] [place], [place] [address]  Internal MEDICINE  Office Visit Note  Patient Name: April George  [id number]  [id number]  Date of Service: 07/22/2022  Chief Complaint  Patient presents with   Hypertension   Follow-up    Weight loss     HPI April George presents for a follow-up visit for weight loss.  Weight loss -- had recent surgery and was not very active and has been eating more. Gained 4 lbs. Ozempic denied, should be wegovy.  Hypertension -- elevated blood pressure, improved with recheck.  Increased risk of CVD       Current Medication: Outpatient Encounter Medications as of 07/22/2022  Medication Sig   albuterol (VENTOLIN HFA) 108 (90 Base) MCG/ACT inhaler Inhale 2 puffs into the lungs every 6 (six) hours as needed for wheezing or shortness of breath.   amLODipine (NORVASC) 5 MG tablet Take 1 tablet (5 mg total) by mouth daily.   busPIRone (BUSPAR) 5 MG tablet Take 1 tablet (5 mg total) by mouth 3 (three) times daily as needed (anxiety).   desloratadine (CLARINEX REDITAB) 5 MG disintegrating tablet Take 1 tablet (5 mg total) by mouth daily as needed.   diclofenac (VOLTAREN) 75 MG EC tablet Take 1 tablet (75 mg total) by mouth 2 (two) times daily.   DULoxetine (CYMBALTA) 60 MG capsule Take 1 capsule (60 mg total) by mouth daily.   fluticasone (FLONASE) 50 MCG/ACT nasal spray Place 2 sprays into both nostrils daily.   furosemide (LASIX) 20 MG tablet Take 1 tablet (20 mg total) by mouth daily.   HYDROcodone-acetaminophen (NORCO/VICODIN) 5-325 MG tablet Take 1 tablet by mouth 2 (two) times daily as needed for moderate pain.   lisinopril (ZESTRIL) 20 MG tablet Take 2 tablets (40 mg total) by mouth daily.   methocarbamol (ROBAXIN) 500 MG tablet Take 1 tablet (500 mg total) by mouth every 6 (six) hours as needed for muscle spasms.   phentermine (ADIPEX-P) 37.5 MG tablet Take 1 tablet (37.5 mg total) by mouth daily before breakfast.   WEGOVY 0.5 MG/0.5ML  SOAJ Inject 0.5 mg into the skin once a week.   [DISCONTINUED] Semaglutide-Weight Management 0.25 MG/0.5ML SOAJ Inject 0.25 mg into the skin once a week.   No facility-administered encounter medications on file as of 07/22/2022.    Surgical History: Past Surgical History:  Procedure Laterality Date   ABDOMINAL HYSTERECTOMY     CARPAL TUNNEL RELEASE Bilateral    COLONOSCOPY     COLONOSCOPY WITH PROPOFOL N/A 02/23/2019   Procedure: COLONOSCOPY WITH PROPOFOL;  Surgeon: Wyline Mood, MD;  Location: [facility] ENDOSCOPY;  Service: Gastroenterology;  Laterality: N/A;   COLONOSCOPY WITH PROPOFOL N/A 03/31/2022   Procedure: COLONOSCOPY WITH PROPOFOL;  Surgeon: Wyline Mood, MD;  Location: [facility] ENDOSCOPY;  Service: Gastroenterology;  Laterality: N/A;    Medical History: Past Medical History:  Diagnosis Date   Asthma    Hypertension     Family History: Family History  Problem Relation Age of Onset   Breast cancer Mother 39   Breast cancer Cousin    Depression Sister    Anxiety disorder Sister     Social History   Socioeconomic History   Marital status: Married    Spouse name: keith   Number of children: 2   Years of education: Not on file   Highest education level: Associate degree: occupational, Scientist, product/process development, or vocational program  Occupational History   Not on file  Tobacco Use   Smoking status: Never  Smokeless tobacco: Never  Vaping Use   Vaping Use: Never used  Substance and Sexual Activity   Alcohol use: No   Drug use: No   Sexual activity: Yes  Other Topics Concern   Not on file  Social History Narrative   Not on file   Social Determinants of Health   Financial Resource Strain: Low Risk  (07/12/2018)   Overall Financial Resource Strain (CARDIA)    Difficulty of Paying Living Expenses: Not hard at all  Food Insecurity: No Food Insecurity (07/12/2018)   Hunger Vital Sign    Worried About Running Out of Food in the Last Year: Never true    Ran Out of Food in the Last Year:  Never true  Transportation Needs: No Transportation Needs (07/12/2018)   PRAPARE - Administrator, Civil Service (Medical): No    Lack of Transportation (Non-Medical): No  Physical Activity: Inactive (07/12/2018)   Exercise Vital Sign    Days of Exercise per Week: 0 days    Minutes of Exercise per Session: 0 min  Stress: No Stress Concern Present (07/12/2018)   [employer] of Occupational Health - Occupational Stress Questionnaire    Feeling of Stress : Not at all  Social Connections: Unknown (07/12/2018)   Social Connection and Isolation Panel [NHANES]    Frequency of Communication with Friends and Family: Not on file    Frequency of Social Gatherings with Friends and Family: Not on file    Attends Religious Services: More than 4 times per year    Active Member of [employer] or Organizations: No    Attends Banker Meetings: Never    Marital Status: Married  Catering manager Violence: Not At Risk (07/12/2018)   Humiliation, Afraid, Rape, and Kick questionnaire    Fear of Current or Ex-Partner: No    Emotionally Abused: No    Physically Abused: No    Sexually Abused: No      Review of Systems  Constitutional:  Positive for fatigue and unexpected weight change. Negative for chills.  HENT:  Negative for congestion, rhinorrhea, sneezing and sore throat.   Eyes:  Negative for redness.  Respiratory: Negative.  Negative for cough, chest tightness, shortness of breath and wheezing.   Cardiovascular: Negative.  Negative for chest pain and palpitations.  Gastrointestinal:  Negative for abdominal pain, constipation, diarrhea, nausea and vomiting.  Genitourinary:  Negative for dysuria and frequency.  Musculoskeletal:  Negative for arthralgias, back pain, joint swelling and neck pain.  Skin:  Negative for rash.  Neurological: Negative.  Negative for tremors and numbness.  Hematological:  Negative for adenopathy. Does not bruise/bleed easily.  Psychiatric/Behavioral:   Positive for behavioral problems (Depression). Negative for self-injury, sleep disturbance and suicidal ideas. The patient is nervous/anxious.        Hypersomnia    Vital Signs: BP 138/78 Comment: 161/77  Pulse 91   Temp 97.6 F (36.4 C)   Resp 16   Ht 5\' 5"  (1.651 m)   Wt (!) 305 lb 3.2 oz (138.4 kg)   SpO2 99%   BMI 50.79 kg/m    Physical Exam Vitals reviewed.  Constitutional:      General: She is not in acute distress.    Appearance: Normal appearance. She is obese. She is not ill-appearing.  HENT:     Head: Normocephalic and atraumatic.  Eyes:     Pupils: Pupils are equal, round, and reactive to light.  Cardiovascular:     Rate and Rhythm: Normal  rate and regular rhythm.  Pulmonary:     Effort: Pulmonary effort is normal. No respiratory distress.  Neurological:     Mental Status: She is alert and oriented to person, place, and time.     Gait: Gait normal.  Psychiatric:        Mood and Affect: Mood normal.        Behavior: Behavior normal.        Assessment/Plan: 1. Primary hypertension BP stable, continue medications, wegovy will help - WEGOVY 0.5 MG/0.5ML SOAJ; Inject 0.5 mg into the skin once a week.  Dispense: 2 mL; Refill: 3  2. Skin lesion of left leg Lesion on posterior surface of left upper leg, needs to be removed. Referred to dermatology.  - Ambulatory referral to Dermatology  3. At increased risk for cardiovascular disease Sample of ozempic given to patient and wegovy prescribed.  - WEGOVY 0.5 MG/0.5ML SOAJ; Inject 0.5 mg into the skin once a week.  Dispense: 2 mL; Refill: 3  4. Class 3 severe obesity due to excess calories with serious comorbidity and body mass index (BMI) of 45.0 to 49.9 in adult April George) Wegovy prescribed. Sample of ozempic given to patient.  - WEGOVY 0.5 MG/0.5ML SOAJ; Inject 0.5 mg into the skin once a week.  Dispense: 2 mL; Refill: 3   General Counseling: April George verbalizes understanding of the findings of todays visit and  agrees with plan of treatment. I have discussed any further diagnostic evaluation that may be needed or ordered today. We also reviewed her medications today. she has been encouraged to call the office with any questions or concerns that should arise related to todays visit.    Orders Placed This Encounter  Procedures   Ambulatory referral to Dermatology    Meds ordered this encounter  Medications   WEGOVY 0.5 MG/0.5ML SOAJ    Sig: Inject 0.5 mg into the skin once a week.    Dispense:  2 mL    Refill:  3    Please send prior authorization for [facility]    Return in about 6 weeks (around 09/02/2022) for F/U, Weight loss, April George PCP.   Total time spent:30 Minutes Time spent includes review of chart, medications, test results, and follow up plan with the patient.   [place] Controlled Substance Database was reviewed by me.  This patient was seen by Sallyanne Kuster, FNP-C in collaboration with Dr. Beverely Risen as a part of collaborative care agreement.   Alondra Vandeven R. Tedd Sias, MSN, FNP-C Internal medicine

## 2022-07-24 ENCOUNTER — Telehealth: Payer: Self-pay | Admitting: Nurse Practitioner

## 2022-07-24 NOTE — Telephone Encounter (Signed)
Dermatology referral sent via [facility] to [facility] 

## 2022-08-10 ENCOUNTER — Other Ambulatory Visit: Payer: Self-pay | Admitting: Nurse Practitioner

## 2022-08-10 DIAGNOSIS — Z760 Encounter for issue of repeat prescription: Secondary | ICD-10-CM

## 2022-09-02 ENCOUNTER — Ambulatory Visit: Payer: Managed Care, Other (non HMO) | Admitting: Nurse Practitioner

## 2022-09-02 ENCOUNTER — Encounter: Payer: Self-pay | Admitting: Nurse Practitioner

## 2022-09-02 VITALS — BP 135/85 | HR 95 | Temp 98.2°F | Resp 16 | Ht 65.0 in | Wt 300.6 lb

## 2022-09-02 DIAGNOSIS — Z6841 Body Mass Index (BMI) 40.0 and over, adult: Secondary | ICD-10-CM

## 2022-09-02 DIAGNOSIS — Z9189 Other specified personal risk factors, not elsewhere classified: Secondary | ICD-10-CM | POA: Diagnosis not present

## 2022-09-02 DIAGNOSIS — R635 Abnormal weight gain: Secondary | ICD-10-CM

## 2022-09-02 DIAGNOSIS — I10 Essential (primary) hypertension: Secondary | ICD-10-CM | POA: Diagnosis not present

## 2022-09-02 NOTE — Progress Notes (Signed)
[facility] [address] [place], [place] [address]  Internal MEDICINE  Office Visit Note  Patient Name: April George  [id number]  [id number]  Date of Service: 09/02/2022  Chief Complaint  Patient presents with   Hypertension   Follow-up    Weight loss     HPI Aniaya presents for a follow-up visit for weight loss management and hypertension Weight loss management -- lost 5 lbs so far. Waiting for prior authorization for wegovy.  Hypertension -- controlled with current medication At increased risk of CV events     Current Medication: Outpatient Encounter Medications as of 09/02/2022  Medication Sig   albuterol (VENTOLIN HFA) 108 (90 Base) MCG/ACT inhaler Inhale 2 puffs into the lungs every 6 (six) hours as needed for wheezing or shortness of breath.   amLODipine (NORVASC) 5 MG tablet Take 1 tablet by mouth once daily   busPIRone (BUSPAR) 5 MG tablet Take 1 tablet (5 mg total) by mouth 3 (three) times daily as needed (anxiety).   desloratadine (CLARINEX REDITAB) 5 MG disintegrating tablet Take 1 tablet (5 mg total) by mouth daily as needed.   diclofenac (VOLTAREN) 75 MG EC tablet Take 1 tablet (75 mg total) by mouth 2 (two) times daily.   DULoxetine (CYMBALTA) 60 MG capsule Take 1 capsule (60 mg total) by mouth daily.   fluticasone (FLONASE) 50 MCG/ACT nasal spray Place 2 sprays into both nostrils daily.   furosemide (LASIX) 20 MG tablet Take 1 tablet (20 mg total) by mouth daily.   HYDROcodone-acetaminophen (NORCO/VICODIN) 5-325 MG tablet Take 1 tablet by mouth 2 (two) times daily as needed for moderate pain.   lisinopril (ZESTRIL) 20 MG tablet Take 2 tablets (40 mg total) by mouth daily.   methocarbamol (ROBAXIN) 500 MG tablet Take 1 tablet (500 mg total) by mouth every 6 (six) hours as needed for muscle spasms.   WEGOVY 0.5 MG/0.5ML SOAJ Inject 0.5 mg into the skin once a week.   [DISCONTINUED] phentermine (ADIPEX-P) 37.5 MG tablet Take 1 tablet (37.5 mg total) by  mouth daily before breakfast.   No facility-administered encounter medications on file as of 09/02/2022.    Surgical History: Past Surgical History:  Procedure Laterality Date   ABDOMINAL HYSTERECTOMY     CARPAL TUNNEL RELEASE Bilateral    COLONOSCOPY     COLONOSCOPY WITH PROPOFOL N/A 02/23/2019   Procedure: COLONOSCOPY WITH PROPOFOL;  Surgeon: Wyline Mood, MD;  Location: [facility] ENDOSCOPY;  Service: Gastroenterology;  Laterality: N/A;   COLONOSCOPY WITH PROPOFOL N/A 03/31/2022   Procedure: COLONOSCOPY WITH PROPOFOL;  Surgeon: Wyline Mood, MD;  Location: [facility] ENDOSCOPY;  Service: Gastroenterology;  Laterality: N/A;    Medical History: Past Medical History:  Diagnosis Date   Asthma    Hypertension     Family History: Family History  Problem Relation Age of Onset   Breast cancer Mother 23   Breast cancer Cousin    Depression Sister    Anxiety disorder Sister     Social History   Socioeconomic History   Marital status: Married    Spouse name: keith   Number of children: 2   Years of education: Not on file   Highest education level: Associate degree: occupational, Scientist, product/process development, or vocational program  Occupational History   Not on file  Tobacco Use   Smoking status: Never   Smokeless tobacco: Never  Vaping Use   Vaping Use: Never used  Substance and Sexual Activity   Alcohol use: No   Drug use: No  Sexual activity: Yes  Other Topics Concern   Not on file  Social History Narrative   Not on file   Social Determinants of Health   Financial Resource Strain: Low Risk  (07/12/2018)   Overall Financial Resource Strain (CARDIA)    Difficulty of Paying Living Expenses: Not hard at all  Food Insecurity: No Food Insecurity (07/12/2018)   Hunger Vital Sign    Worried About Running Out of Food in the Last Year: Never true    Ran Out of Food in the Last Year: Never true  Transportation Needs: No Transportation Needs (07/12/2018)   PRAPARE - Scientist, research (physical sciences) (Medical): No    Lack of Transportation (Non-Medical): No  Physical Activity: Inactive (07/12/2018)   Exercise Vital Sign    Days of Exercise per Week: 0 days    Minutes of Exercise per Session: 0 min  Stress: No Stress Concern Present (07/12/2018)   [employer] of Occupational Health - Occupational Stress Questionnaire    Feeling of Stress : Not at all  Social Connections: Unknown (07/12/2018)   Social Connection and Isolation Panel [NHANES]    Frequency of Communication with Friends and Family: Not on file    Frequency of Social Gatherings with Friends and Family: Not on file    Attends Religious Services: More than 4 times per year    Active Member of [employer] or Organizations: No    Attends Banker Meetings: Never    Marital Status: Married  Catering manager Violence: Not At Risk (07/12/2018)   Humiliation, Afraid, Rape, and Kick questionnaire    Fear of Current or Ex-Partner: No    Emotionally Abused: No    Physically Abused: No    Sexually Abused: No      Review of Systems  Constitutional:  Positive for fatigue and unexpected weight change. Negative for chills.  HENT:  Negative for congestion, rhinorrhea, sneezing and sore throat.   Eyes:  Negative for redness.  Respiratory: Negative.  Negative for cough, chest tightness, shortness of breath and wheezing.   Cardiovascular: Negative.  Negative for chest pain and palpitations.  Gastrointestinal:  Negative for abdominal pain, constipation, diarrhea, nausea and vomiting.  Genitourinary:  Negative for dysuria and frequency.  Musculoskeletal:  Negative for arthralgias, back pain, joint swelling and neck pain.  Skin:  Negative for rash.  Neurological: Negative.  Negative for tremors and numbness.  Hematological:  Negative for adenopathy. Does not bruise/bleed easily.  Psychiatric/Behavioral:  Positive for behavioral problems (Depression). Negative for self-injury, sleep disturbance and suicidal ideas.  The patient is nervous/anxious.        Hypersomnia    Vital Signs: BP 135/85 Comment: 140/90  Pulse 95   Temp 98.2 F (36.8 C)   Resp 16   Ht 5\' 5"  (1.651 m)   Wt (!) 300 lb 9.6 oz (136.4 kg)   SpO2 97%   BMI 50.02 kg/m    Physical Exam Vitals reviewed.  Constitutional:      General: She is not in acute distress.    Appearance: Normal appearance. She is obese. She is not ill-appearing.  HENT:     Head: Normocephalic and atraumatic.  Eyes:     Pupils: Pupils are equal, round, and reactive to light.  Cardiovascular:     Rate and Rhythm: Normal rate and regular rhythm.  Pulmonary:     Effort: Pulmonary effort is normal. No respiratory distress.  Neurological:     Mental Status: She is alert  and oriented to person, place, and time.     Gait: Gait normal.  Psychiatric:        Mood and Affect: Mood normal.        Behavior: Behavior normal.        Assessment/Plan: 1. Primary hypertension Stable, continue medications as prescribed.   2. Abnormal weight gain Sample of wegovy given, working on prior authorization now  3. Class 3 severe obesity due to excess calories with serious comorbidity and body mass index (BMI) of 45.0 to 49.9 in adult [facility]) Wegovy sample given, working on prior authorization  4. At increased risk for cardiovascular disease Waiting for prior approval of wegovy   General Counseling: celita hegger understanding of the findings of todays visit and agrees with plan of treatment. I have discussed any further diagnostic evaluation that may be needed or ordered today. We also reviewed her medications today. she has been encouraged to call the office with any questions or concerns that should arise related to todays visit.    No orders of the defined types were placed in this encounter.   No orders of the defined types were placed in this encounter.   Return in about 4 weeks (around 09/30/2022) for F/U, Weight loss, Koltyn Kelsay PCP.   Total time  spent:30 Minutes Time spent includes review of chart, medications, test results, and follow up plan with the patient.   [place] Controlled Substance Database was reviewed by me.  This patient was seen by Sallyanne Kuster, FNP-C in collaboration with Dr. Beverely Risen as a part of collaborative care agreement.   Perri Aragones R. Tedd Sias, MSN, FNP-C Internal medicine

## 2022-09-03 ENCOUNTER — Encounter: Payer: Self-pay | Admitting: Nurse Practitioner

## 2022-09-29 ENCOUNTER — Telehealth: Payer: Self-pay | Admitting: Nurse Practitioner

## 2022-09-29 NOTE — Telephone Encounter (Signed)
Lvm and sent message regarding dermatology referral-Toni 

## 2022-09-30 ENCOUNTER — Ambulatory Visit: Payer: Managed Care, Other (non HMO) | Admitting: Nurse Practitioner

## 2022-09-30 ENCOUNTER — Encounter: Payer: Self-pay | Admitting: Nurse Practitioner

## 2022-09-30 VITALS — BP 135/82 | HR 92 | Temp 98.3°F | Resp 16 | Ht 65.0 in | Wt 299.6 lb

## 2022-09-30 DIAGNOSIS — R635 Abnormal weight gain: Secondary | ICD-10-CM | POA: Diagnosis not present

## 2022-09-30 DIAGNOSIS — I10 Essential (primary) hypertension: Secondary | ICD-10-CM

## 2022-09-30 DIAGNOSIS — Z6841 Body Mass Index (BMI) 40.0 and over, adult: Secondary | ICD-10-CM

## 2022-09-30 NOTE — Progress Notes (Signed)
[facility] [address] [place], [place] [address]  Internal MEDICINE  Office Visit Note  Patient Name: April George  [id number]  [id number]  Date of Service: 09/30/2022  Chief Complaint  Patient presents with   Hypertension   Follow-up    HPI April George presents for a follow-up visit for hypertension and weight loss.  Cannot get wegovy unless she tries weight watchers according to her insurance. Wants to try on her own for now. Lost another lbs and is at 299 lbs.  Hypertension -- elevated today, rechecked. Improved.     Current Medication: Outpatient Encounter Medications as of 09/30/2022  Medication Sig   albuterol (VENTOLIN HFA) 108 (90 Base) MCG/ACT inhaler Inhale 2 puffs into the lungs every 6 (six) hours as needed for wheezing or shortness of breath.   amLODipine (NORVASC) 5 MG tablet Take 1 tablet by mouth once daily   busPIRone (BUSPAR) 5 MG tablet Take 1 tablet (5 mg total) by mouth 3 (three) times daily as needed (anxiety).   desloratadine (CLARINEX REDITAB) 5 MG disintegrating tablet Take 1 tablet (5 mg total) by mouth daily as needed.   diclofenac (VOLTAREN) 75 MG EC tablet Take 1 tablet (75 mg total) by mouth 2 (two) times daily.   DULoxetine (CYMBALTA) 60 MG capsule Take 1 capsule (60 mg total) by mouth daily.   fluticasone (FLONASE) 50 MCG/ACT nasal spray Place 2 sprays into both nostrils daily.   furosemide (LASIX) 20 MG tablet Take 1 tablet (20 mg total) by mouth daily.   HYDROcodone-acetaminophen (NORCO/VICODIN) 5-325 MG tablet Take 1 tablet by mouth 2 (two) times daily as needed for moderate pain.   lisinopril (ZESTRIL) 20 MG tablet Take 2 tablets (40 mg total) by mouth daily.   methocarbamol (ROBAXIN) 500 MG tablet Take 1 tablet (500 mg total) by mouth every 6 (six) hours as needed for muscle spasms.   [DISCONTINUED] WEGOVY 0.5 MG/0.5ML SOAJ Inject 0.5 mg into the skin once a week.   No facility-administered encounter medications on file as of  09/30/2022.    Surgical History: Past Surgical History:  Procedure Laterality Date   ABDOMINAL HYSTERECTOMY     CARPAL TUNNEL RELEASE Bilateral    COLONOSCOPY     COLONOSCOPY WITH PROPOFOL N/A 02/23/2019   Procedure: COLONOSCOPY WITH PROPOFOL;  Surgeon: Wyline Mood, MD;  Location: [facility] ENDOSCOPY;  Service: Gastroenterology;  Laterality: N/A;   COLONOSCOPY WITH PROPOFOL N/A 03/31/2022   Procedure: COLONOSCOPY WITH PROPOFOL;  Surgeon: Wyline Mood, MD;  Location: [facility] ENDOSCOPY;  Service: Gastroenterology;  Laterality: N/A;    Medical History: Past Medical History:  Diagnosis Date   Asthma    Hypertension     Family History: Family History  Problem Relation Age of Onset   Breast cancer Mother 38   Breast cancer Cousin    Depression Sister    Anxiety disorder Sister     Social History   Socioeconomic History   Marital status: Married    Spouse name: keith   Number of children: 2   Years of education: Not on file   Highest education level: Associate degree: occupational, Scientist, product/process development, or vocational program  Occupational History   Not on file  Tobacco Use   Smoking status: Never   Smokeless tobacco: Never  Vaping Use   Vaping status: Never Used  Substance and Sexual Activity   Alcohol use: No   Drug use: No   Sexual activity: Yes  Other Topics Concern   Not on file  Social History  Narrative   Not on file   Social Determinants of Health   Financial Resource Strain: Low Risk  (07/12/2018)   Overall Financial Resource Strain (CARDIA)    Difficulty of Paying Living Expenses: Not hard at all  Food Insecurity: No Food Insecurity (07/12/2018)   Hunger Vital Sign    Worried About Running Out of Food in the Last Year: Never true    Ran Out of Food in the Last Year: Never true  Transportation Needs: No Transportation Needs (07/12/2018)   PRAPARE - Administrator, Civil Service (Medical): No    Lack of Transportation (Non-Medical): No  Physical Activity: Inactive  (07/12/2018)   Exercise Vital Sign    Days of Exercise per Week: 0 days    Minutes of Exercise per Session: 0 min  Stress: No Stress Concern Present (07/12/2018)   [employer] of Occupational Health - Occupational Stress Questionnaire    Feeling of Stress : Not at all  Social Connections: Unknown (07/12/2018)   Social Connection and Isolation Panel [NHANES]    Frequency of Communication with Friends and Family: Not on file    Frequency of Social Gatherings with Friends and Family: Not on file    Attends Religious Services: More than 4 times per year    Active Member of [employer] or Organizations: No    Attends Banker Meetings: Never    Marital Status: Married  Catering manager Violence: Not At Risk (07/12/2018)   Humiliation, Afraid, Rape, and Kick questionnaire    Fear of Current or Ex-Partner: No    Emotionally Abused: No    Physically Abused: No    Sexually Abused: No      Review of Systems  Constitutional:  Positive for fatigue and unexpected weight change. Negative for chills.  HENT:  Negative for congestion, rhinorrhea, sneezing and sore throat.   Eyes:  Negative for redness.  Respiratory: Negative.  Negative for cough, chest tightness, shortness of breath and wheezing.   Cardiovascular: Negative.  Negative for chest pain and palpitations.  Gastrointestinal:  Negative for abdominal pain, constipation, diarrhea, nausea and vomiting.  Genitourinary:  Negative for dysuria and frequency.  Musculoskeletal:  Negative for arthralgias, back pain, joint swelling and neck pain.  Skin:  Negative for rash.  Neurological: Negative.  Negative for tremors and numbness.  Hematological:  Negative for adenopathy. Does not bruise/bleed easily.  Psychiatric/Behavioral:  Positive for behavioral problems (Depression). Negative for self-injury, sleep disturbance and suicidal ideas. The patient is nervous/anxious.        Hypersomnia    Vital Signs: BP 135/82 Comment: 140/96   Pulse 92   Temp 98.3 F (36.8 C)   Resp 16   Ht 5\' 5"  (1.651 m)   Wt 299 lb 9.6 oz (135.9 kg)   SpO2 97%   BMI 49.86 kg/m    Physical Exam Vitals reviewed.  Constitutional:      General: She is not in acute distress.    Appearance: Normal appearance. She is obese. She is not ill-appearing.  HENT:     Head: Normocephalic and atraumatic.  Eyes:     Pupils: Pupils are equal, round, and reactive to light.  Cardiovascular:     Rate and Rhythm: Normal rate and regular rhythm.  Pulmonary:     Effort: Pulmonary effort is normal. No respiratory distress.  Neurological:     Mental Status: She is alert and oriented to person, place, and time.     Gait: Gait normal.  Psychiatric:  Mood and Affect: Mood normal.        Behavior: Behavior normal.        Assessment/Plan: 1. Primary hypertension Continue amlodipine and lisinopril as prescribed.   2. Abnormal weight gain Working on her diet and lifestyle modification, follow up in 4 weeks   3. Class 3 severe obesity due to excess calories with serious comorbidity and body mass index (BMI) of 45.0 to 49.9 in adult [facility]) Continue with current diet and lifestyle modifications and follow up in 4 weeks.    General Counseling: tabia landowski understanding of the findings of todays visit and agrees with plan of treatment. I have discussed any further diagnostic evaluation that may be needed or ordered today. We also reviewed her medications today. she has been encouraged to call the office with any questions or concerns that should arise related to todays visit.    No orders of the defined types were placed in this encounter.   No orders of the defined types were placed in this encounter.   Return in about 4 weeks (around 10/28/2022) for F/U, Weight loss, Faiz Weber PCP.   Total time spent:30 Minutes Time spent includes review of chart, medications, test results, and follow up plan with the patient.   [place] Controlled Substance  Database was reviewed by me.  This patient was seen by Sallyanne Kuster, FNP-C in collaboration with Dr. Beverely Risen as a part of collaborative care agreement.   Azariyah Luhrs R. Tedd Sias, MSN, FNP-C Internal medicine

## 2022-10-03 ENCOUNTER — Encounter: Payer: Self-pay | Admitting: Nurse Practitioner

## 2022-10-28 ENCOUNTER — Ambulatory Visit: Payer: Managed Care, Other (non HMO) | Admitting: Nurse Practitioner

## 2022-12-01 ENCOUNTER — Encounter: Payer: Self-pay | Admitting: Nurse Practitioner

## 2022-12-01 ENCOUNTER — Ambulatory Visit: Payer: Managed Care, Other (non HMO) | Admitting: Nurse Practitioner

## 2022-12-01 VITALS — BP 161/77 | HR 99 | Temp 98.1°F | Resp 16 | Ht 65.0 in | Wt 305.6 lb

## 2022-12-01 DIAGNOSIS — Z760 Encounter for issue of repeat prescription: Secondary | ICD-10-CM

## 2022-12-01 DIAGNOSIS — I10 Essential (primary) hypertension: Secondary | ICD-10-CM

## 2022-12-01 DIAGNOSIS — M5441 Lumbago with sciatica, right side: Secondary | ICD-10-CM | POA: Diagnosis not present

## 2022-12-01 DIAGNOSIS — Z6841 Body Mass Index (BMI) 40.0 and over, adult: Secondary | ICD-10-CM

## 2022-12-01 DIAGNOSIS — M170 Bilateral primary osteoarthritis of knee: Secondary | ICD-10-CM

## 2022-12-01 DIAGNOSIS — G8929 Other chronic pain: Secondary | ICD-10-CM

## 2022-12-01 MED ORDER — LISINOPRIL 20 MG PO TABS: 40.0000 mg | ORAL_TABLET | Freq: Every day | ORAL | 1 refills | Status: DC

## 2022-12-01 MED ORDER — HYDROCODONE-ACETAMINOPHEN 5-325 MG PO TABS: 1.0000 | ORAL_TABLET | Freq: Two times a day (BID) | ORAL | 0 refills | Status: DC | PRN

## 2022-12-01 MED ORDER — FUROSEMIDE 20 MG PO TABS: 20.0000 mg | ORAL_TABLET | Freq: Every day | ORAL | Status: DC

## 2022-12-01 MED ORDER — DICLOFENAC SODIUM 75 MG PO TBEC: 75.0000 mg | DELAYED_RELEASE_TABLET | Freq: Two times a day (BID) | ORAL | 0 refills | Status: DC

## 2022-12-01 MED ORDER — AMLODIPINE BESYLATE 5 MG PO TABS: 5.0000 mg | ORAL_TABLET | Freq: Every day | ORAL | 1 refills | Status: DC

## 2022-12-01 MED ORDER — METHOCARBAMOL 500 MG PO TABS: 500.0000 mg | ORAL_TABLET | Freq: Four times a day (QID) | ORAL | 1 refills | Status: DC | PRN

## 2022-12-01 NOTE — Progress Notes (Signed)
[facility] [address] [place], [place] [address]  Internal MEDICINE  Office Visit Note  Patient Name: April George  [id number]  [id number]  Date of Service: 12/01/2022  Chief Complaint  Patient presents with   Follow-up    Weight loss     HPI April George presents for a follow-up visit for migraines, hypertension, back pain and weight loss.  Migraines -- had a migraine today Weight loss -- has other issues to straighten out right now, wants to readdress in January.  Chronic back pain -- has been bother her more, losing sleep which makes her tired at work  Hypertension -- elevated today, takes lisinopril, amlodipine and hctz      Current Medication: Outpatient Encounter Medications as of 12/01/2022  Medication Sig   albuterol (VENTOLIN HFA) 108 (90 Base) MCG/ACT inhaler Inhale 2 puffs into the lungs every 6 (six) hours as needed for wheezing or shortness of breath.   busPIRone (BUSPAR) 5 MG tablet Take 1 tablet (5 mg total) by mouth 3 (three) times daily as needed (anxiety).   desloratadine (CLARINEX REDITAB) 5 MG disintegrating tablet Take 1 tablet (5 mg total) by mouth daily as needed.   DULoxetine (CYMBALTA) 60 MG capsule Take 1 capsule (60 mg total) by mouth daily.   fluticasone (FLONASE) 50 MCG/ACT nasal spray Place 2 sprays into both nostrils daily.   [DISCONTINUED] amLODipine (NORVASC) 5 MG tablet Take 1 tablet by mouth once daily   [DISCONTINUED] diclofenac (VOLTAREN) 75 MG EC tablet Take 1 tablet (75 mg total) by mouth 2 (two) times daily.   [DISCONTINUED] furosemide (LASIX) 20 MG tablet Take 1 tablet (20 mg total) by mouth daily.   [DISCONTINUED] HYDROcodone-acetaminophen (NORCO/VICODIN) 5-325 MG tablet Take 1 tablet by mouth 2 (two) times daily as needed for moderate pain.   [DISCONTINUED] lisinopril (ZESTRIL) 20 MG tablet Take 2 tablets (40 mg total) by mouth daily.   [DISCONTINUED] methocarbamol (ROBAXIN) 500 MG tablet Take 1 tablet (500 mg total) by mouth  every 6 (six) hours as needed for muscle spasms.   amLODipine (NORVASC) 5 MG tablet Take 1 tablet (5 mg total) by mouth daily.   diclofenac (VOLTAREN) 75 MG EC tablet Take 1 tablet (75 mg total) by mouth 2 (two) times daily.   furosemide (LASIX) 20 MG tablet Take 1 tablet (20 mg total) by mouth daily.   HYDROcodone-acetaminophen (NORCO/VICODIN) 5-325 MG tablet Take 1 tablet by mouth 2 (two) times daily as needed for moderate pain.   lisinopril (ZESTRIL) 20 MG tablet Take 2 tablets (40 mg total) by mouth daily.   methocarbamol (ROBAXIN) 500 MG tablet Take 1 tablet (500 mg total) by mouth every 6 (six) hours as needed for muscle spasms.   No facility-administered encounter medications on file as of 12/01/2022.    Surgical History: Past Surgical History:  Procedure Laterality Date   ABDOMINAL HYSTERECTOMY     CARPAL TUNNEL RELEASE Bilateral    COLONOSCOPY     COLONOSCOPY WITH PROPOFOL N/A 02/23/2019   Procedure: COLONOSCOPY WITH PROPOFOL;  Surgeon: Wyline Mood, MD;  Location: [facility] ENDOSCOPY;  Service: Gastroenterology;  Laterality: N/A;   COLONOSCOPY WITH PROPOFOL N/A 03/31/2022   Procedure: COLONOSCOPY WITH PROPOFOL;  Surgeon: Wyline Mood, MD;  Location: [facility] ENDOSCOPY;  Service: Gastroenterology;  Laterality: N/A;    Medical History: Past Medical History:  Diagnosis Date   Asthma    Hypertension     Family History: Family History  Problem Relation Age of Onset   Breast cancer Mother 26  Breast cancer Cousin    Depression Sister    Anxiety disorder Sister     Social History   Socioeconomic History   Marital status: Married    Spouse name: keith   Number of children: 2   Years of education: Not on file   Highest education level: Associate degree: occupational, Scientist, product/process development, or vocational program  Occupational History   Not on file  Tobacco Use   Smoking status: Never   Smokeless tobacco: Never  Vaping Use   Vaping status: Never Used  Substance and Sexual Activity    Alcohol use: No   Drug use: No   Sexual activity: Yes  Other Topics Concern   Not on file  Social History Narrative   Not on file   Social Determinants of Health   Financial Resource Strain: Low Risk  (07/12/2018)   Overall Financial Resource Strain (CARDIA)    Difficulty of Paying Living Expenses: Not hard at all  Food Insecurity: No Food Insecurity (07/12/2018)   Hunger Vital Sign    Worried About Running Out of Food in the Last Year: Never true    Ran Out of Food in the Last Year: Never true  Transportation Needs: No Transportation Needs (07/12/2018)   PRAPARE - Administrator, Civil Service (Medical): No    Lack of Transportation (Non-Medical): No  Physical Activity: Inactive (07/12/2018)   Exercise Vital Sign    Days of Exercise per Week: 0 days    Minutes of Exercise per Session: 0 min  Stress: No Stress Concern Present (07/12/2018)   [employer] of Occupational Health - Occupational Stress Questionnaire    Feeling of Stress : Not at all  Social Connections: Unknown (07/12/2018)   Social Connection and Isolation Panel [NHANES]    Frequency of Communication with Friends and Family: Not on file    Frequency of Social Gatherings with Friends and Family: Not on file    Attends Religious Services: More than 4 times per year    Active Member of [employer] or Organizations: No    Attends Banker Meetings: Never    Marital Status: Married  Catering manager Violence: Not At Risk (07/12/2018)   Humiliation, Afraid, Rape, and Kick questionnaire    Fear of Current or Ex-Partner: No    Emotionally Abused: No    Physically Abused: No    Sexually Abused: No      Review of Systems  Constitutional:  Positive for fatigue and unexpected weight change. Negative for chills.  HENT:  Negative for congestion, rhinorrhea, sneezing and sore throat.   Eyes:  Negative for redness.  Respiratory: Negative.  Negative for cough, chest tightness, shortness of breath and  wheezing.   Cardiovascular: Negative.  Negative for chest pain and palpitations.  Gastrointestinal:  Negative for abdominal pain, constipation, diarrhea, nausea and vomiting.  Genitourinary:  Negative for dysuria and frequency.  Musculoskeletal:  Negative for arthralgias, back pain, joint swelling and neck pain.  Skin:  Negative for rash.  Neurological: Negative.  Negative for tremors and numbness.  Hematological:  Negative for adenopathy. Does not bruise/bleed easily.  Psychiatric/Behavioral:  Positive for behavioral problems (Depression). Negative for self-injury, sleep disturbance and suicidal ideas. The patient is nervous/anxious.        Hypersomnia    Vital Signs: BP (!) 161/77   Pulse 99   Temp 98.1 F (36.7 C)   Resp 16   Ht 5\' 5"  (1.651 m)   Wt (!) 305 lb 9.6 oz (138.6  kg)   SpO2 97%   BMI 50.85 kg/m    Physical Exam Vitals reviewed.  Constitutional:      General: She is not in acute distress.    Appearance: Normal appearance. She is obese. She is not ill-appearing.  HENT:     Head: Normocephalic and atraumatic.  Eyes:     Pupils: Pupils are equal, round, and reactive to light.  Cardiovascular:     Rate and Rhythm: Normal rate and regular rhythm.  Pulmonary:     Effort: Pulmonary effort is normal. No respiratory distress.  Neurological:     Mental Status: She is alert and oriented to person, place, and time.     Gait: Gait normal.  Psychiatric:        Mood and Affect: Mood normal.        Behavior: Behavior normal.        Assessment/Plan: 1. Chronic right-sided low back pain with right-sided sciatica Continue diclofenac, methocarbamol and prn hydrocodone as prescribed.  - HYDROcodone-acetaminophen (NORCO/VICODIN) 5-325 MG tablet; Take 1 tablet by mouth 2 (two) times daily as needed for moderate pain.  Dispense: 30 tablet; Refill: 0 - methocarbamol (ROBAXIN) 500 MG tablet; Take 1 tablet (500 mg total) by mouth every 6 (six) hours as needed for muscle  spasms.  Dispense: 90 tablet; Refill: 1 - diclofenac (VOLTAREN) 75 MG EC tablet; Take 1 tablet (75 mg total) by mouth 2 (two) times daily.  Dispense: 60 tablet; Refill: 0  2. Primary osteoarthritis of both knees Continue medications as prescribed.  - HYDROcodone-acetaminophen (NORCO/VICODIN) 5-325 MG tablet; Take 1 tablet by mouth 2 (two) times daily as needed for moderate pain.  Dispense: 30 tablet; Refill: 0 - methocarbamol (ROBAXIN) 500 MG tablet; Take 1 tablet (500 mg total) by mouth every 6 (six) hours as needed for muscle spasms.  Dispense: 90 tablet; Refill: 1 - diclofenac (VOLTAREN) 75 MG EC tablet; Take 1 tablet (75 mg total) by mouth 2 (two) times daily.  Dispense: 60 tablet; Refill: 0  3. Primary hypertension Continue lisinopril, amlodipine and furosemide as prescribed, refills ordered  - lisinopril (ZESTRIL) 20 MG tablet; Take 2 tablets (40 mg total) by mouth daily.  Dispense: 180 tablet; Refill: 1 - furosemide (LASIX) 20 MG tablet; Take 1 tablet (20 mg total) by mouth daily. - amLODipine (NORVASC) 5 MG tablet; Take 1 tablet (5 mg total) by mouth daily.  Dispense: 90 tablet; Refill: 1  4. Class 3 severe obesity due to excess calories with serious comorbidity and body mass index (BMI) of 45.0 to 49.9 in adult [facility]) On hold until January, will discuss again at that time. Wants to focus on general health for a while and then readdress weight loss.    General Counseling: lillyauna goya understanding of the findings of todays visit and agrees with plan of treatment. I have discussed any further diagnostic evaluation that may be needed or ordered today. We also reviewed her medications today. she has been encouraged to call the office with any questions or concerns that should arise related to todays visit.    No orders of the defined types were placed in this encounter.   Meds ordered this encounter  Medications   lisinopril (ZESTRIL) 20 MG tablet    Sig: Take 2 tablets (40 mg  total) by mouth daily.    Dispense:  180 tablet    Refill:  1    Increased dose to 40mg  daily.   furosemide (LASIX) 20 MG tablet    Sig:  Take 1 tablet (20 mg total) by mouth daily.   HYDROcodone-acetaminophen (NORCO/VICODIN) 5-325 MG tablet    Sig: Take 1 tablet by mouth 2 (two) times daily as needed for moderate pain.    Dispense:  30 tablet    Refill:  0    refill   amLODipine (NORVASC) 5 MG tablet    Sig: Take 1 tablet (5 mg total) by mouth daily.    Dispense:  90 tablet    Refill:  1   methocarbamol (ROBAXIN) 500 MG tablet    Sig: Take 1 tablet (500 mg total) by mouth every 6 (six) hours as needed for muscle spasms.    Dispense:  90 tablet    Refill:  1    New script, fill today.   diclofenac (VOLTAREN) 75 MG EC tablet    Sig: Take 1 tablet (75 mg total) by mouth 2 (two) times daily.    Dispense:  60 tablet    Refill:  0    Return in about 3 months (around 03/02/2023) for F/U, Noely Kuhnle PCP at patients preference. .   Total time spent:30 Minutes Time spent includes review of chart, medications, test results, and follow up plan with the patient.   [place] Controlled Substance Database was reviewed by me.  This patient was seen by Sallyanne Kuster, FNP-C in collaboration with Dr. Beverely Risen as a part of collaborative care agreement.   Drena Ham R. Tedd Sias, MSN, FNP-C Internal medicine

## 2022-12-09 ENCOUNTER — Telehealth: Payer: Self-pay | Admitting: Nurse Practitioner

## 2022-12-09 NOTE — Telephone Encounter (Signed)
Redirected dermatology referral via Epic to [facility]

## 2023-01-16 ENCOUNTER — Encounter: Payer: Self-pay | Admitting: Nurse Practitioner

## 2023-01-27 ENCOUNTER — Ambulatory Visit: Payer: Managed Care, Other (non HMO) | Admitting: Dermatology

## 2023-01-27 ENCOUNTER — Encounter: Payer: Self-pay | Admitting: Dermatology

## 2023-01-27 DIAGNOSIS — L821 Other seborrheic keratosis: Secondary | ICD-10-CM | POA: Diagnosis not present

## 2023-01-27 DIAGNOSIS — D492 Neoplasm of unspecified behavior of bone, soft tissue, and skin: Secondary | ICD-10-CM | POA: Diagnosis not present

## 2023-01-27 DIAGNOSIS — L820 Inflamed seborrheic keratosis: Secondary | ICD-10-CM | POA: Diagnosis not present

## 2023-01-27 DIAGNOSIS — D485 Neoplasm of uncertain behavior of skin: Secondary | ICD-10-CM

## 2023-01-27 NOTE — Progress Notes (Signed)
   New Patient Visit   Subjective  April George is a 62 y.o. female who presents for the following: a spot at left upper post thigh. Spot does bleed and gets irritated.   The patient has spots, moles and lesions to be evaluated, some may be new or changing and the patient may have concern these could be cancer.   The following portions of the chart were reviewed this encounter and updated as appropriate: medications, allergies, medical history  Review of Systems:  No other skin or systemic complaints except as noted in HPI or Assessment and Plan.  Objective  Well appearing patient in no apparent distress; mood and affect are within normal limits.    A focused examination was performed of the following areas: Legs  Relevant exam findings are noted in the Assessment and Plan.  Left Thigh - Posterior Skin colored pedunculated papule       Assessment & Plan     Neoplasm of uncertain behavior of skin Left Thigh - Posterior  Skin / nail biopsy Type of biopsy: tangential   Informed consent: discussed and consent obtained   Timeout: patient name, date of birth, surgical site, and procedure verified   Procedure prep:  Patient was prepped and draped in usual sterile fashion Prep type:  Isopropyl alcohol Anesthesia: the lesion was anesthetized in a standard fashion   Anesthetic:  1% lidocaine w/ epinephrine 1-100,000 buffered w/ 8.4% NaHCO3 Instrument used: DermaBlade   Hemostasis achieved with: pressure and aluminum chloride   Outcome: patient tolerated procedure well   Post-procedure details: sterile dressing applied and wound care instructions given   Dressing type: bandage and petrolatum    Specimen 1 - Surgical pathology Differential Diagnosis: Acrochordon vs Nevus  Check Margins: No Skin colored pedunculated papule  Dermatosis papulosa nigra face  SEBORRHEIC KERATOSIS - Stuck-on, waxy, tan-brown papules and/or plaques on face - Benign-appearing - Discussed  benign etiology and prognosis. - Observe. Cosmetic charge $60 first $15 additional if not inflamed or irritated - Call for any changes      Return if symptoms worsen or fail to improve.  Anise Salvo, RMA, am acting as scribe for April Goody, MD .   Documentation: I have reviewed the above documentation for accuracy and completeness, and I agree with the above.  April Goody, MD

## 2023-01-27 NOTE — Patient Instructions (Signed)
Wound Care Instructions  Cleanse wound gently with soap and water once a day then pat dry with clean gauze. Apply a thin coat of Petrolatum (petroleum jelly, "Vaseline") over the wound (unless you have an allergy to this). We recommend that you use a new, sterile tube of Vaseline. Do not pick or remove scabs. Do not remove the yellow or white "healing tissue" from the base of the wound.  Cover the wound with fresh, clean, nonstick gauze and secure with paper tape. You may use Band-Aids in place of gauze and tape if the wound is small enough, but would recommend trimming much of the tape off as there is often too much. Sometimes Band-Aids can irritate the skin.  You should call the office for your biopsy report after 1 week if you have not already been contacted.  If you experience any problems, such as abnormal amounts of bleeding, swelling, significant bruising, significant pain, or evidence of infection, please call the office immediately.  FOR ADULT SURGERY PATIENTS: If you need something for pain relief you may take 1 extra strength Tylenol (acetaminophen) AND 2 Ibuprofen (200mg  each) together every 4 hours as needed for pain. (do not take these if you are allergic to them or if you have a reason you should not take them.) Typically, you may only need pain medication for 1 to 3 days.   Due to recent changes in healthcare laws, you may see results of your pathology and/or laboratory studies on MyChart before the doctors have had a chance to review them. We understand that in some cases there may be results that are confusing or concerning to you. Please understand that not all results are received at the same time and often the doctors may need to interpret multiple results in order to provide you with the best plan of care or course of treatment. Therefore, we ask that you please give [place] 2 business days to thoroughly review all your results before contacting the office for clarification. Should we  see a critical lab result, you will be contacted sooner.   If You Need Anything After Your Visit  If you have any questions or concerns for your doctor, please call our main line at [phone] and press option 4 to reach your doctor's medical assistant. If no one answers, please leave a voicemail as directed and we will return your call as soon as possible. Messages left after 4 pm will be answered the following business day.   You may also send [place] a message via MyChart. We typically respond to MyChart messages within 1-2 business days.  For prescription refills, please ask your pharmacy to contact our office. Our fax number is [phone].  If you have an urgent issue when the clinic is closed that cannot wait until the next business day, you can page your doctor at the number below.    Please note that while we do our best to be available for urgent issues outside of office hours, we are not available 24/7.   If you have an urgent issue and are unable to reach [place], you may choose to seek medical care at your doctor's office, retail clinic, urgent care center, or emergency room.  If you have a medical emergency, please immediately call 911 or go to the emergency department.  Pager Numbers  - Dr. Gwen Pounds: [phone]  - Dr. Roseanne Reno: [phone]  - Dr. Katrinka Blazing: [phone]   In the event of inclement weather, please call our main line at [phone]  for an update on the status of any delays or closures.  Dermatology Medication Tips: Please keep the boxes that topical medications come in in order to help keep track of the instructions about where and how to use these. Pharmacies typically print the medication instructions only on the boxes and not directly on the medication tubes.   If your medication is too expensive, please contact our office at [phone] option 4 or send [place] a message through MyChart.   We are unable to tell what your co-pay for medications will be in  advance as this is different depending on your insurance coverage. However, we may be able to find a substitute medication at lower cost or fill out paperwork to get insurance to cover a needed medication.   If a prior authorization is required to get your medication covered by your insurance company, please allow [place] 1-2 business days to complete this process.  Drug prices often vary depending on where the prescription is filled and some pharmacies may offer cheaper prices.  The website www.goodrx.com contains coupons for medications through different pharmacies. The prices here do not account for what the cost may be with help from insurance (it may be cheaper with your insurance), but the website can give you the price if you did not use any insurance.  - You can print the associated coupon and take it with your prescription to the pharmacy.  - You may also stop by our office during regular business hours and pick up a GoodRx coupon card.  - If you need your prescription sent electronically to a different pharmacy, notify our office through [facility] or by phone at [phone] option 4.

## 2023-02-01 LAB — SURGICAL PATHOLOGY

## 2023-02-02 ENCOUNTER — Telehealth: Payer: Self-pay

## 2023-02-02 NOTE — Telephone Encounter (Signed)
-----   Message from [place] sent at 02/02/2023  9:02 AM EST ----- Diagnosis: SEBORRHEIC KERATOSIS, IRRITATED  Plan: none, sent mychart message

## 2023-02-17 ENCOUNTER — Telehealth: Payer: Self-pay

## 2023-02-17 NOTE — Telephone Encounter (Signed)
Have tried to call patient 3 times 11/12, 11/18 and 11/27 with bx results showing benign seborrheic keratosis. No answer, VM full. Butch Penny., RMA

## 2023-02-17 NOTE — Telephone Encounter (Signed)
-----  Message from [place] sent at 02/02/2023  9:02 AM EST ----- Diagnosis: SEBORRHEIC KERATOSIS, IRRITATED  Plan: none, sent mychart message

## 2023-02-23 ENCOUNTER — Telehealth: Payer: Self-pay | Admitting: Nurse Practitioner

## 2023-02-23 NOTE — Telephone Encounter (Signed)
Mailbox full,sent mychart message to confirm 03/01/23 appointment-[facility]

## 2023-03-01 ENCOUNTER — Encounter: Payer: Managed Care, Other (non HMO) | Admitting: Nurse Practitioner

## 2023-03-02 ENCOUNTER — Ambulatory Visit: Payer: Managed Care, Other (non HMO) | Admitting: Nurse Practitioner

## 2023-05-31 ENCOUNTER — Other Ambulatory Visit: Payer: Self-pay | Admitting: Nurse Practitioner

## 2023-05-31 ENCOUNTER — Telehealth: Payer: Self-pay | Admitting: Nurse Practitioner

## 2023-05-31 DIAGNOSIS — I10 Essential (primary) hypertension: Secondary | ICD-10-CM

## 2023-05-31 NOTE — Telephone Encounter (Signed)
 Lvm to reschedule missed physical appointment-[facility]

## 2023-05-31 NOTE — Telephone Encounter (Signed)
 Last here 9/24 2 no show and no next

## 2023-06-22 ENCOUNTER — Telehealth: Payer: Self-pay | Admitting: Nurse Practitioner

## 2023-06-22 NOTE — Telephone Encounter (Signed)
 Lvm to move 07/01/23 appointment-[facility]

## 2023-06-24 ENCOUNTER — Telehealth: Payer: Self-pay | Admitting: Nurse Practitioner

## 2023-06-24 NOTE — Telephone Encounter (Signed)
 Left vm and sent mychart message to confirm 07/01/23 appointment-[facility]

## 2023-07-01 ENCOUNTER — Encounter: Admitting: Nurse Practitioner

## 2023-07-06 ENCOUNTER — Ambulatory Visit (INDEPENDENT_AMBULATORY_CARE_PROVIDER_SITE_OTHER): Admitting: Nurse Practitioner

## 2023-07-06 ENCOUNTER — Encounter: Payer: Self-pay | Admitting: Nurse Practitioner

## 2023-07-06 VITALS — BP 150/92 | HR 76 | Temp 98.2°F | Resp 16 | Ht 65.0 in | Wt 304.0 lb

## 2023-07-06 DIAGNOSIS — E559 Vitamin D deficiency, unspecified: Secondary | ICD-10-CM

## 2023-07-06 DIAGNOSIS — Z833 Family history of diabetes mellitus: Secondary | ICD-10-CM

## 2023-07-06 DIAGNOSIS — M170 Bilateral primary osteoarthritis of knee: Secondary | ICD-10-CM

## 2023-07-06 DIAGNOSIS — Z0001 Encounter for general adult medical examination with abnormal findings: Secondary | ICD-10-CM | POA: Diagnosis not present

## 2023-07-06 DIAGNOSIS — F411 Generalized anxiety disorder: Secondary | ICD-10-CM

## 2023-07-06 DIAGNOSIS — E66813 Obesity, class 3: Secondary | ICD-10-CM

## 2023-07-06 DIAGNOSIS — Z6841 Body Mass Index (BMI) 40.0 and over, adult: Secondary | ICD-10-CM

## 2023-07-06 DIAGNOSIS — Z760 Encounter for issue of repeat prescription: Secondary | ICD-10-CM

## 2023-07-06 DIAGNOSIS — E782 Mixed hyperlipidemia: Secondary | ICD-10-CM

## 2023-07-06 DIAGNOSIS — Z1231 Encounter for screening mammogram for malignant neoplasm of breast: Secondary | ICD-10-CM

## 2023-07-06 DIAGNOSIS — F3341 Major depressive disorder, recurrent, in partial remission: Secondary | ICD-10-CM

## 2023-07-06 DIAGNOSIS — I10 Essential (primary) hypertension: Secondary | ICD-10-CM

## 2023-07-06 DIAGNOSIS — E538 Deficiency of other specified B group vitamins: Secondary | ICD-10-CM

## 2023-07-06 DIAGNOSIS — G8929 Other chronic pain: Secondary | ICD-10-CM

## 2023-07-06 DIAGNOSIS — J309 Allergic rhinitis, unspecified: Secondary | ICD-10-CM

## 2023-07-06 MED ORDER — BUSPIRONE HCL 5 MG PO TABS: 5.0000 mg | ORAL_TABLET | Freq: Three times a day (TID) | ORAL | 2 refills | Status: DC | PRN

## 2023-07-06 MED ORDER — DICLOFENAC SODIUM 75 MG PO TBEC: 75.0000 mg | DELAYED_RELEASE_TABLET | Freq: Two times a day (BID) | ORAL | 0 refills | Status: DC

## 2023-07-06 MED ORDER — ALBUTEROL SULFATE HFA 108 (90 BASE) MCG/ACT IN AERS: 2.0000 | INHALATION_SPRAY | Freq: Four times a day (QID) | RESPIRATORY_TRACT | 3 refills | Status: AC | PRN

## 2023-07-06 MED ORDER — DULOXETINE HCL 60 MG PO CPEP
60.0000 mg | ORAL_CAPSULE | Freq: Every day | ORAL | 11 refills | Status: AC
Start: 2023-07-06 — End: ?

## 2023-07-06 MED ORDER — HYDROCODONE-ACETAMINOPHEN 5-325 MG PO TABS: 1.0000 | ORAL_TABLET | Freq: Two times a day (BID) | ORAL | 0 refills | Status: AC | PRN

## 2023-07-06 MED ORDER — FLUTICASONE PROPIONATE 50 MCG/ACT NA SUSP: 2.0000 | Freq: Every day | NASAL | 3 refills | Status: DC

## 2023-07-06 MED ORDER — LISINOPRIL 20 MG PO TABS: 40.0000 mg | ORAL_TABLET | Freq: Every day | ORAL | 0 refills | Status: DC

## 2023-07-06 MED ORDER — AMLODIPINE BESYLATE 5 MG PO TABS: 5.0000 mg | ORAL_TABLET | Freq: Every day | ORAL | 1 refills | Status: DC

## 2023-07-06 MED ORDER — DESLORATADINE 5 MG PO TBDP
5.0000 mg | ORAL_TABLET | Freq: Every day | ORAL | 5 refills | Status: AC | PRN
Start: 2023-07-06 — End: ?

## 2023-07-06 MED ORDER — METHOCARBAMOL 500 MG PO TABS: 500.0000 mg | ORAL_TABLET | Freq: Four times a day (QID) | ORAL | 1 refills | Status: DC | PRN

## 2023-07-06 MED ORDER — WEGOVY 0.5 MG/0.5ML ~~LOC~~ SOAJ: 0.5000 mg | SUBCUTANEOUS | 5 refills | Status: DC

## 2023-07-06 NOTE — Progress Notes (Signed)
 [facility] [address] [place], [place] [address]  Internal MEDICINE  Office Visit Note  Patient Name: April George  [id number]  [id number]  Date of Service: 07/06/2023  Chief Complaint  Patient presents with   Hypertension   Annual Exam    HPI Shanessa presents for an annual well visit and physical exam.  Well-appearing 63 y.o. female with hypertension, allergic rhinitis, osteoarthritis, and depression.  Routine CRC screening: colonoscopy due in 2034 Routine mammogram: due now  DEXA scan: due in 4 years  Labs: due for routine labs  New or worsening pain: none  Other concerns: weight loss  Environmental allergies are bothering her, needs eye drops for itchy watery eyes.    Current Medication: Outpatient Encounter Medications as of 07/06/2023  Medication Sig   Semaglutide -Weight Management (WEGOVY ) 0.5 MG/0.5ML SOAJ Inject 0.5 mg into the skin once a week.   [DISCONTINUED] albuterol  (VENTOLIN  HFA) 108 (90 Base) MCG/ACT inhaler Inhale 2 puffs into the lungs every 6 (six) hours as needed for wheezing or shortness of breath.   [DISCONTINUED] amLODipine  (NORVASC ) 5 MG tablet Take 1 tablet (5 mg total) by mouth daily.   [DISCONTINUED] busPIRone  (BUSPAR ) 5 MG tablet Take 1 tablet (5 mg total) by mouth 3 (three) times daily as needed (anxiety).   [DISCONTINUED] desloratadine  (CLARINEX  REDITAB) 5 MG disintegrating tablet Take 1 tablet (5 mg total) by mouth daily as needed.   [DISCONTINUED] diclofenac  (VOLTAREN ) 75 MG EC tablet Take 1 tablet (75 mg total) by mouth 2 (two) times daily.   [DISCONTINUED] DULoxetine  (CYMBALTA ) 60 MG capsule Take 1 capsule (60 mg total) by mouth daily.   [DISCONTINUED] fluticasone  (FLONASE ) 50 MCG/ACT nasal spray Place 2 sprays into both nostrils daily.   [DISCONTINUED] furosemide  (LASIX ) 20 MG tablet Take 1 tablet (20 mg total) by mouth daily.   [DISCONTINUED] HYDROcodone -acetaminophen  (NORCO/VICODIN) 5-325 MG tablet Take 1 tablet by mouth 2 (two)  times daily as needed for moderate pain.   [DISCONTINUED] lisinopril  (ZESTRIL ) 20 MG tablet Take 2 tablets by mouth once daily   [DISCONTINUED] methocarbamol  (ROBAXIN ) 500 MG tablet Take 1 tablet (500 mg total) by mouth every 6 (six) hours as needed for muscle spasms.   albuterol  (VENTOLIN  HFA) 108 (90 Base) MCG/ACT inhaler Inhale 2 puffs into the lungs every 6 (six) hours as needed for wheezing or shortness of breath.   amLODipine  (NORVASC ) 5 MG tablet Take 1 tablet (5 mg total) by mouth daily.   busPIRone  (BUSPAR ) 5 MG tablet Take 1 tablet (5 mg total) by mouth 3 (three) times daily as needed (anxiety).   desloratadine  (CLARINEX  REDITAB) 5 MG disintegrating tablet Take 1 tablet (5 mg total) by mouth daily as needed.   diclofenac  (VOLTAREN ) 75 MG EC tablet Take 1 tablet (75 mg total) by mouth 2 (two) times daily.   DULoxetine  (CYMBALTA ) 60 MG capsule Take 1 capsule (60 mg total) by mouth daily.   fluticasone  (FLONASE ) 50 MCG/ACT nasal spray Place 2 sprays into both nostrils daily.   HYDROcodone -acetaminophen  (NORCO/VICODIN) 5-325 MG tablet Take 1 tablet by mouth 2 (two) times daily as needed for moderate pain (pain score 4-6).   lisinopril  (ZESTRIL ) 20 MG tablet Take 2 tablets (40 mg total) by mouth daily.   methocarbamol  (ROBAXIN ) 500 MG tablet Take 1 tablet (500 mg total) by mouth every 6 (six) hours as needed for muscle spasms.   No facility-administered encounter medications on file as of 07/06/2023.    Surgical History: Past Surgical History:  Procedure Laterality Date  ABDOMINAL HYSTERECTOMY     CARPAL TUNNEL RELEASE Bilateral    COLONOSCOPY     COLONOSCOPY WITH PROPOFOL  N/A 02/23/2019   Procedure: COLONOSCOPY WITH PROPOFOL ;  Surgeon: Luke Salaam, MD;  Location: [facility] ENDOSCOPY;  Service: Gastroenterology;  Laterality: N/A;   COLONOSCOPY WITH PROPOFOL  N/A 03/31/2022   Procedure: COLONOSCOPY WITH PROPOFOL ;  Surgeon: Luke Salaam, MD;  Location: [facility] ENDOSCOPY;  Service: Gastroenterology;   Laterality: N/A;    Medical History: Past Medical History:  Diagnosis Date   Asthma    Hypertension     Family History: Family History  Problem Relation Age of Onset   Breast cancer Mother 59   Breast cancer Cousin    Depression Sister    Anxiety disorder Sister     Social History   Socioeconomic History   Marital status: Married    Spouse name: keith   Number of children: 2   Years of education: Not on file   Highest education level: Associate degree: occupational, Scientist, product/process development, or vocational program  Occupational History   Not on file  Tobacco Use   Smoking status: Never   Smokeless tobacco: Never  Vaping Use   Vaping status: Never Used  Substance and Sexual Activity   Alcohol use: No   Drug use: No   Sexual activity: Yes  Other Topics Concern   Not on file  Social History Narrative   Not on file   Social Drivers of Health   Financial Resource Strain: Low Risk  (07/12/2018)   Overall Financial Resource Strain (CARDIA)    Difficulty of Paying Living Expenses: Not hard at all  Food Insecurity: No Food Insecurity (07/12/2018)   Hunger Vital Sign    Worried About Running Out of Food in the Last Year: Never true    Ran Out of Food in the Last Year: Never true  Transportation Needs: No Transportation Needs (07/12/2018)   PRAPARE - Administrator, Civil Service (Medical): No    Lack of Transportation (Non-Medical): No  Physical Activity: Inactive (07/12/2018)   Exercise Vital Sign    Days of Exercise per Week: 0 days    Minutes of Exercise per Session: 0 min  Stress: No Stress Concern Present (07/12/2018)   [employer] of Occupational Health - Occupational Stress Questionnaire    Feeling of Stress : Not at all  Social Connections: Unknown (07/12/2018)   Social Connection and Isolation Panel [NHANES]    Frequency of Communication with Friends and Family: Not on file    Frequency of Social Gatherings with Friends and Family: Not on file    Attends  Religious Services: More than 4 times per year    Active Member of [employer] or Organizations: No    Attends Banker Meetings: Never    Marital Status: Married  Catering manager Violence: Not At Risk (07/12/2018)   Humiliation, Afraid, Rape, and Kick questionnaire    Fear of Current or Ex-Partner: No    Emotionally Abused: No    Physically Abused: No    Sexually Abused: No      Review of Systems  Constitutional:  Negative for activity change, appetite change, chills, fatigue, fever and unexpected weight change.  HENT: Negative.  Negative for congestion, ear pain, rhinorrhea, sore throat and trouble swallowing.   Eyes: Negative.   Respiratory: Negative.  Negative for cough, chest tightness, shortness of breath and wheezing.   Cardiovascular: Negative.  Negative for chest pain.  Gastrointestinal: Negative.  Negative for abdominal pain, blood in  stool, constipation, diarrhea, nausea and vomiting.  Endocrine: Negative.   Genitourinary: Negative.  Negative for difficulty urinating, dysuria, frequency, hematuria and urgency.  Musculoskeletal: Negative.  Negative for arthralgias, back pain, joint swelling, myalgias and neck pain.  Skin: Negative.  Negative for rash and wound.  Allergic/Immunologic: Negative.  Negative for immunocompromised state.  Neurological: Negative.  Negative for dizziness, seizures, numbness and headaches.  Hematological: Negative.   Psychiatric/Behavioral: Negative.  Negative for behavioral problems, self-injury and suicidal ideas. The patient is not nervous/anxious.     Vital Signs: BP (!) 150/92   Pulse 76   Temp 98.2 F (36.8 C)   Resp 16   Ht 5\' 5"  (1.651 m)   Wt (!) 304 lb (137.9 kg)   SpO2 97%   BMI 50.59 kg/m    Physical Exam Vitals reviewed.  Constitutional:      General: She is awake. She is not in acute distress.    Appearance: Normal appearance. She is well-developed and well-groomed. She is morbidly obese. She is not ill-appearing  or diaphoretic.  HENT:     Head: Normocephalic and atraumatic.     Right Ear: Tympanic membrane, ear canal and external ear normal.     Left Ear: Tympanic membrane, ear canal and external ear normal.     Nose: Nose normal. No congestion or rhinorrhea.     Mouth/Throat:     Lips: Pink.     Mouth: Mucous membranes are moist.     Pharynx: Oropharynx is clear. Uvula midline. No oropharyngeal exudate or posterior oropharyngeal erythema.  Eyes:     General: Lids are normal. Vision grossly intact. Gaze aligned appropriately. No scleral icterus.       Right eye: No discharge.        Left eye: No discharge.     Extraocular Movements: Extraocular movements intact.     Conjunctiva/sclera: Conjunctivae normal.     Pupils: Pupils are equal, round, and reactive to light.     Funduscopic exam:    Right eye: Red reflex present.        Left eye: Red reflex present. Neck:     Thyroid: No thyromegaly.     Vascular: No JVD.     Trachea: Trachea and phonation normal. No tracheal deviation.  Cardiovascular:     Rate and Rhythm: Normal rate and regular rhythm.     Pulses: Normal pulses.     Heart sounds: Normal heart sounds, S1 normal and S2 normal. No murmur heard.    No friction rub. No gallop.  Pulmonary:     Effort: Pulmonary effort is normal. No accessory muscle usage or respiratory distress.     Breath sounds: Normal breath sounds and air entry. No stridor. No wheezing or rales.  Chest:     Chest wall: No tenderness.     Comments: Declined clinical breast exam Abdominal:     General: Bowel sounds are normal. There is no distension.     Palpations: Abdomen is soft. There is no shifting dullness, fluid wave, mass or pulsatile mass.     Tenderness: There is no abdominal tenderness. There is no guarding or rebound.  Musculoskeletal:        General: No tenderness or deformity. Normal range of motion.     Cervical back: Normal range of motion and neck supple.     Right lower leg: No edema.      Left lower leg: No edema.  Lymphadenopathy:     Cervical: No cervical adenopathy.  Skin:    General: Skin is warm and dry.     Capillary Refill: Capillary refill takes less than 2 seconds.     Coloration: Skin is not pale.     Findings: No erythema or rash.  Neurological:     Mental Status: She is alert and oriented to person, place, and time.     Cranial Nerves: No cranial nerve deficit.     Motor: No abnormal muscle tone.     Coordination: Coordination normal.     Deep Tendon Reflexes: Reflexes are normal and symmetric.  Psychiatric:        Mood and Affect: Mood and affect normal.        Behavior: Behavior normal. Behavior is cooperative.        Thought Content: Thought content normal.        Judgment: Judgment normal.        Assessment/Plan: 1. Encounter for routine adult health examination with abnormal findings (Primary) Age-appropriate preventive screenings and vaccinations discussed, annual physical exam completed. Routine labs for health maintenance ordered, see below. PHM updated.  Routine medication refills ordered =. - desloratadine  (CLARINEX  REDITAB) 5 MG disintegrating tablet; Take 1 tablet (5 mg total) by mouth daily as needed.  Dispense: 30 tablet; Refill: 5 - fluticasone  (FLONASE ) 50 MCG/ACT nasal spray; Place 2 sprays into both nostrils daily.  Dispense: 48 g; Refill: 3 - methocarbamol  (ROBAXIN ) 500 MG tablet; Take 1 tablet (500 mg total) by mouth every 6 (six) hours as needed for muscle spasms.  Dispense: 90 tablet; Refill: 1 - HYDROcodone -acetaminophen  (NORCO/VICODIN) 5-325 MG tablet; Take 1 tablet by mouth 2 (two) times daily as needed for moderate pain (pain score 4-6).  Dispense: 30 tablet; Refill: 0 - albuterol  (VENTOLIN  HFA) 108 (90 Base) MCG/ACT inhaler; Inhale 2 puffs into the lungs every 6 (six) hours as needed for wheezing or shortness of breath.  Dispense: 18 g; Refill: 3 - CBC with Differential/Platelet - CMP14+EGFR - Lipid Profile - Vitamin D  (25  hydroxy) - B12 and Folate Panel - Hgb A1C w/o eAG  2. Primary hypertension Stable, continue amlodipine  and lisinopril  as prescribed. Routine labs ordered  - amLODipine  (NORVASC ) 5 MG tablet; Take 1 tablet (5 mg total) by mouth daily.  Dispense: 90 tablet; Refill: 1 - lisinopril  (ZESTRIL ) 20 MG tablet; Take 2 tablets (40 mg total) by mouth daily.  Dispense: 60 tablet; Refill: 0 - CBC with Differential/Platelet - CMP14+EGFR - Lipid Profile - Vitamin D  (25 hydroxy) - B12 and Folate Panel - Hgb A1C w/o eAG  3. Mixed hyperlipidemia Routine labs ordered  - CBC with Differential/Platelet - CMP14+EGFR - Lipid Profile  4. B12 deficiency Routine lab ordered  - B12 and Folate Panel  5. Vitamin D  deficiency Routine lab ordered  - Vitamin D  (25 hydroxy)  6. Class 3 severe obesity due to excess calories with serious comorbidity and body mass index (BMI) of 45.0 to 49.9 in adult Will try wegovy  if approved by insurance.  - Semaglutide -Weight Management (WEGOVY ) 0.5 MG/0.5ML SOAJ; Inject 0.5 mg into the skin once a week.  Dispense: 2 mL; Refill: 5  7. Encounter for screening mammogram for malignant neoplasm of breast Routine mammogram ordered  - MM 3D SCREENING MAMMOGRAM BILATERAL BREAST; Future  8. Family history of diabetes mellitus type II Routine lab ordered  - Hgb A1C w/o eAG  9. Generalized anxiety disorder Continue buspirone  as prescribed  - busPIRone  (BUSPAR ) 5 MG tablet; Take 1 tablet (5 mg total) by mouth 3 (three)  times daily as needed (anxiety).  Dispense: 60 tablet; Refill: 2  10. Recurrent major depression in partial remission (HCC) Continue duloxetine  as prescribed.  - DULoxetine  (CYMBALTA ) 60 MG capsule; Take 1 capsule (60 mg total) by mouth daily.  Dispense: 30 capsule; Refill: 11   General Counseling: Brannon verbalizes understanding of the findings of todays visit and agrees with plan of treatment. I have discussed any further diagnostic evaluation that may be needed  or ordered today. We also reviewed her medications today. she has been encouraged to call the office with any questions or concerns that should arise related to todays visit.    Orders Placed This Encounter  Procedures   MM 3D SCREENING MAMMOGRAM BILATERAL BREAST   CBC with Differential/Platelet   CMP14+EGFR   Lipid Profile   Vitamin D  (25 hydroxy)   B12 and Folate Panel   Hgb A1C w/o eAG    Meds ordered this encounter  Medications   amLODipine  (NORVASC ) 5 MG tablet    Sig: Take 1 tablet (5 mg total) by mouth daily.    Dispense:  90 tablet    Refill:  1   desloratadine  (CLARINEX  REDITAB) 5 MG disintegrating tablet    Sig: Take 1 tablet (5 mg total) by mouth daily as needed.    Dispense:  30 tablet    Refill:  5   busPIRone  (BUSPAR ) 5 MG tablet    Sig: Take 1 tablet (5 mg total) by mouth 3 (three) times daily as needed (anxiety).    Dispense:  60 tablet    Refill:  2   diclofenac  (VOLTAREN ) 75 MG EC tablet    Sig: Take 1 tablet (75 mg total) by mouth 2 (two) times daily.    Dispense:  60 tablet    Refill:  0   DULoxetine  (CYMBALTA ) 60 MG capsule    Sig: Take 1 capsule (60 mg total) by mouth daily.    Dispense:  30 capsule    Refill:  11    Change prescription to monthly please.   fluticasone  (FLONASE ) 50 MCG/ACT nasal spray    Sig: Place 2 sprays into both nostrils daily.    Dispense:  48 g    Refill:  3   lisinopril  (ZESTRIL ) 20 MG tablet    Sig: Take 2 tablets (40 mg total) by mouth daily.    Dispense:  60 tablet    Refill:  0   methocarbamol  (ROBAXIN ) 500 MG tablet    Sig: Take 1 tablet (500 mg total) by mouth every 6 (six) hours as needed for muscle spasms.    Dispense:  90 tablet    Refill:  1    New script, fill today.   HYDROcodone -acetaminophen  (NORCO/VICODIN) 5-325 MG tablet    Sig: Take 1 tablet by mouth 2 (two) times daily as needed for moderate pain (pain score 4-6).    Dispense:  30 tablet    Refill:  0    refill   albuterol  (VENTOLIN  HFA) 108 (90  Base) MCG/ACT inhaler    Sig: Inhale 2 puffs into the lungs every 6 (six) hours as needed for wheezing or shortness of breath.    Dispense:  18 g    Refill:  3    Does not need filled now, will call when she needs it filled.   Semaglutide -Weight Management (WEGOVY ) 0.5 MG/0.5ML SOAJ    Sig: Inject 0.5 mg into the skin once a week.    Dispense:  2 mL    Refill:  5    Dx code E66.01, fill new script, send PA request if required    Return in about 4 weeks (around 08/03/2023) for F/U, Labs, Weight loss, eval new med, Rifky Lapre PCP.   Total time spent:30 Minutes Time spent includes review of chart, medications, test results, and follow up plan with the patient.   [place] Controlled Substance Database was reviewed by me.  This patient was seen by Laurence Pons, FNP-C in collaboration with Dr. Verneta Gone as a part of collaborative care agreement.  Ellicia Alix R. Bobbi Burow, MSN, FNP-C Internal medicine

## 2023-08-02 ENCOUNTER — Other Ambulatory Visit: Payer: Self-pay | Admitting: Nurse Practitioner

## 2023-08-02 DIAGNOSIS — M170 Bilateral primary osteoarthritis of knee: Secondary | ICD-10-CM

## 2023-08-02 DIAGNOSIS — G8929 Other chronic pain: Secondary | ICD-10-CM

## 2023-08-04 ENCOUNTER — Ambulatory Visit: Admitting: Nurse Practitioner

## 2023-08-08 ENCOUNTER — Encounter: Payer: Self-pay | Admitting: Nurse Practitioner

## 2023-08-09 ENCOUNTER — Encounter: Payer: Self-pay | Admitting: Physician Assistant

## 2023-08-09 ENCOUNTER — Ambulatory Visit: Admitting: Physician Assistant

## 2023-08-09 VITALS — BP 175/80 | HR 90 | Temp 98.3°F | Resp 16 | Ht 65.0 in | Wt 303.0 lb

## 2023-08-09 DIAGNOSIS — I10 Essential (primary) hypertension: Secondary | ICD-10-CM

## 2023-08-09 DIAGNOSIS — F411 Generalized anxiety disorder: Secondary | ICD-10-CM | POA: Diagnosis not present

## 2023-08-09 NOTE — Progress Notes (Signed)
 [facility] [address] [place], [place] [address]  Internal MEDICINE  Office Visit Note  Patient Name: April George  [id number]  [id number]  Date of Service: 08/09/2023  Chief Complaint  Patient presents with   Acute Visit   Hypertension     HPI Pt is here for a sick visit. -BP elevated this morning, felt a little off at work. Harder time focusing/vision a little blurry as if she needs to blink to clear and legs felt a little jittery/tingly---maybe a little anxious/on edge feeling -Went and got something to eat but didn't help. So had nurse check her BP and it was 186/90 on left, then 194/100 on right on recheck -taking 1 tablet of lisinopril , not 2 tabs (so 20mg  total), and takes 5mg  amlodipine  daily -went to [place] sat morning and then came back from [place] yesterday at 4pm, was more tired from quick round trip and went to sleep. Woke up and ate dinner then went back to bed.  -denies headache, CP, palpitations, SOB, or weakness -BP 172/92 on recheck in office -discussed taking another tab lisinopril  now and monitoring closely. If new or worsening symptoms go to ED -will have labs done in the next week before follow up with Alyssa on 08/18/23 and will be able to monitor potassium/renal function on increased lisinopril  with these labs  Current Medication:  Outpatient Encounter Medications as of 08/09/2023  Medication Sig   albuterol  (VENTOLIN  HFA) 108 (90 Base) MCG/ACT inhaler Inhale 2 puffs into the lungs every 6 (six) hours as needed for wheezing or shortness of breath.   amLODipine  (NORVASC ) 5 MG tablet Take 1 tablet (5 mg total) by mouth daily.   busPIRone  (BUSPAR ) 5 MG tablet Take 1 tablet (5 mg total) by mouth 3 (three) times daily as needed (anxiety).   desloratadine  (CLARINEX  REDITAB) 5 MG disintegrating tablet Take 1 tablet (5 mg total) by mouth daily as needed.   diclofenac  (VOLTAREN ) 75 MG EC tablet Take 1 tablet by mouth twice daily   DULoxetine   (CYMBALTA ) 60 MG capsule Take 1 capsule (60 mg total) by mouth daily.   fluticasone  (FLONASE ) 50 MCG/ACT nasal spray Place 2 sprays into both nostrils daily.   HYDROcodone -acetaminophen  (NORCO/VICODIN) 5-325 MG tablet Take 1 tablet by mouth 2 (two) times daily as needed for moderate pain (pain score 4-6).   lisinopril  (ZESTRIL ) 20 MG tablet Take 2 tablets (40 mg total) by mouth daily.   methocarbamol  (ROBAXIN ) 500 MG tablet Take 1 tablet (500 mg total) by mouth every 6 (six) hours as needed for muscle spasms.   Semaglutide -Weight Management (WEGOVY ) 0.5 MG/0.5ML SOAJ Inject 0.5 mg into the skin once a week.   No facility-administered encounter medications on file as of 08/09/2023.      Medical History: Past Medical History:  Diagnosis Date   Asthma    Hypertension      Vital Signs: BP (!) 175/80   Pulse 90 Comment: 100  Temp 98.3 F (36.8 C)   Resp 16   Ht 5\' 5"  (1.651 m)   Wt (!) 303 lb (137.4 kg)   SpO2 96%   BMI 50.42 kg/m    Review of Systems  Constitutional:  Negative for fever.  HENT:  Negative for congestion, mouth sores and postnasal drip.   Eyes:  Positive for visual disturbance.  Respiratory:  Negative for cough.   Cardiovascular:  Negative for chest pain.  Genitourinary:  Negative for flank pain.  Skin:  Negative for rash.  Neurological:  Negative for syncope, weakness and headaches.  Psychiatric/Behavioral:  The patient is nervous/anxious.     Physical Exam Vitals reviewed.  Constitutional:      General: She is not in acute distress.    Appearance: Normal appearance. She is obese. She is not ill-appearing.  HENT:     Head: Normocephalic and atraumatic.  Eyes:     Extraocular Movements: Extraocular movements intact.  Cardiovascular:     Rate and Rhythm: Normal rate and regular rhythm.  Pulmonary:     Effort: Pulmonary effort is normal. No respiratory distress.  Skin:    General: Skin is warm and dry.  Neurological:     General: No focal deficit  present.     Mental Status: She is alert.     Gait: Gait normal.  Psychiatric:        Mood and Affect: Mood normal.        Behavior: Behavior normal.       Assessment/Plan: 1. Primary hypertension (Primary) Very elevated in office and will take second tab lisinopril  now and monitor closely. Discussed script is written for 40mg  total daily and will start this daily now instead of only since tab 20mg  daily. Has labs pending and will get these in a few days to ensure potassium stable on increased dose. Continue amlodipine  as before. If new or worsening symptoms arise then advised to go to ED for further evaluation. Has follow up in 1.5 weeks with Janeice Medal, FNP. Call if any problems prior to this.  2. Generalized anxiety disorder Continue current medications   General Counseling: Cailah verbalizes understanding of the findings of todays visit and agrees with plan of treatment. I have discussed any further diagnostic evaluation that may be needed or ordered today. We also reviewed her medications today. she has been encouraged to call the office with any questions or concerns that should arise related to todays visit.    Counseling:    No orders of the defined types were placed in this encounter.   No orders of the defined types were placed in this encounter.   Time spent:30 Minutes

## 2023-08-18 ENCOUNTER — Ambulatory Visit: Admitting: Nurse Practitioner

## 2023-08-18 ENCOUNTER — Encounter: Payer: Self-pay | Admitting: Nurse Practitioner

## 2023-08-18 VITALS — BP 138/88 | HR 81 | Temp 98.0°F | Resp 16 | Ht 65.0 in | Wt 306.4 lb

## 2023-08-18 DIAGNOSIS — Z6841 Body Mass Index (BMI) 40.0 and over, adult: Secondary | ICD-10-CM

## 2023-08-18 DIAGNOSIS — E66813 Obesity, class 3: Secondary | ICD-10-CM

## 2023-08-18 DIAGNOSIS — R7303 Prediabetes: Secondary | ICD-10-CM | POA: Diagnosis not present

## 2023-08-18 DIAGNOSIS — E559 Vitamin D deficiency, unspecified: Secondary | ICD-10-CM

## 2023-08-18 DIAGNOSIS — I10 Essential (primary) hypertension: Secondary | ICD-10-CM

## 2023-08-18 LAB — CMP14+EGFR
ALT: 16 IU/L (ref 0–32)
AST: 16 IU/L (ref 0–40)
Albumin: 4.2 g/dL (ref 3.9–4.9)
Alkaline Phosphatase: 81 IU/L (ref 44–121)
BUN/Creatinine Ratio: 14 (ref 12–28)
BUN: 12 mg/dL (ref 8–27)
Bilirubin Total: 0.3 mg/dL (ref 0.0–1.2)
CO2: 22 mmol/L (ref 20–29)
Calcium: 9.2 mg/dL (ref 8.7–10.3)
Chloride: 102 mmol/L (ref 96–106)
Creatinine, Ser: 0.88 mg/dL (ref 0.57–1.00)
Globulin, Total: 2.6 g/dL (ref 1.5–4.5)
Glucose: 81 mg/dL (ref 70–99)
Potassium: 4.4 mmol/L (ref 3.5–5.2)
Sodium: 140 mmol/L (ref 134–144)
Total Protein: 6.8 g/dL (ref 6.0–8.5)
eGFR: 75 mL/min/{1.73_m2} (ref >59)

## 2023-08-18 LAB — LIPID PANEL
Chol/HDL Ratio: 2.3 ratio (ref 0.0–4.4)
Cholesterol, Total: 141 mg/dL (ref 100–199)
HDL: 61 mg/dL (ref >39)
LDL Chol Calc (NIH): 64 mg/dL (ref 0–99)
Triglycerides: 83 mg/dL (ref 0–149)
VLDL Cholesterol Cal: 16 mg/dL (ref 5–40)

## 2023-08-18 LAB — B12 AND FOLATE PANEL
Folate: 6 ng/mL (ref >3.0)
Vitamin B-12: 536 pg/mL (ref 232–1245)

## 2023-08-18 LAB — CBC WITH DIFFERENTIAL/PLATELET
Basophils Absolute: 0 10*3/uL (ref 0.0–0.2)
Basos: 0 %
EOS (ABSOLUTE): 0.3 10*3/uL (ref 0.0–0.4)
Eos: 5 %
Hematocrit: 40.2 % (ref 34.0–46.6)
Hemoglobin: 12.8 g/dL (ref 11.1–15.9)
Immature Grans (Abs): 0 10*3/uL (ref 0.0–0.1)
Immature Granulocytes: 0 %
Lymphocytes Absolute: 1.3 10*3/uL (ref 0.7–3.1)
Lymphs: 29 %
MCH: 27.2 pg (ref 26.6–33.0)
MCHC: 31.8 g/dL (ref 31.5–35.7)
MCV: 86 fL (ref 79–97)
Monocytes Absolute: 0.5 10*3/uL (ref 0.1–0.9)
Monocytes: 10 %
Neutrophils Absolute: 2.5 10*3/uL (ref 1.4–7.0)
Neutrophils: 56 %
Platelets: 261 10*3/uL (ref 150–450)
RBC: 4.7 x10E6/uL (ref 3.77–5.28)
RDW: 12.5 % (ref 11.7–15.4)
WBC: 4.6 10*3/uL (ref 3.4–10.8)

## 2023-08-18 LAB — HGB A1C W/O EAG: Hgb A1c MFr Bld: 5.7 % — ABNORMAL HIGH (ref 4.8–5.6)

## 2023-08-18 LAB — VITAMIN D 25 HYDROXY (VIT D DEFICIENCY, FRACTURES): Vit D, 25-Hydroxy: 12.8 ng/mL — ABNORMAL LOW (ref 30.0–100.0)

## 2023-08-18 MED ORDER — VITAMIN D (ERGOCALCIFEROL) 1.25 MG (50000 UNIT) PO CAPS: 50000.0000 [IU] | ORAL_CAPSULE | ORAL | 1 refills | Status: DC

## 2023-08-18 NOTE — Progress Notes (Signed)
 [facility] [address] [place], [place] [address]  Internal MEDICINE  Office Visit Note  Patient Name: April George  [id number]  [id number]  Date of Service: 08/18/2023  Chief Complaint  Patient presents with   Hypertension   Follow-up    Weight loss    HPI Cybil presents for a follow-up visit for lab results and weight loss.  Prediabetes -- A1c is 5.7 on her labs  Weight loss -- joined weight watchers last week. Plan is to get wegovy  or zepbound after joining weight watchers since it is a requirement for her insurance before they will cover GLP-1 medications.  Hypertension -- controlled with amlodipine  and lisinopril   Low vitamin D  12.8, was previously on the weekly vitamin D  supplement a few years ago.  Cholesterol panel is normal.   Current Medication: Outpatient Encounter Medications as of 08/18/2023  Medication Sig   albuterol  (VENTOLIN  HFA) 108 (90 Base) MCG/ACT inhaler Inhale 2 puffs into the lungs every 6 (six) hours as needed for wheezing or shortness of breath.   amLODipine  (NORVASC ) 5 MG tablet Take 1 tablet (5 mg total) by mouth daily.   busPIRone  (BUSPAR ) 5 MG tablet Take 1 tablet (5 mg total) by mouth 3 (three) times daily as needed (anxiety).   desloratadine  (CLARINEX  REDITAB) 5 MG disintegrating tablet Take 1 tablet (5 mg total) by mouth daily as needed.   diclofenac  (VOLTAREN ) 75 MG EC tablet Take 1 tablet by mouth twice daily   DULoxetine  (CYMBALTA ) 60 MG capsule Take 1 capsule (60 mg total) by mouth daily.   fluticasone  (FLONASE ) 50 MCG/ACT nasal spray Place 2 sprays into both nostrils daily.   HYDROcodone -acetaminophen  (NORCO/VICODIN) 5-325 MG tablet Take 1 tablet by mouth 2 (two) times daily as needed for moderate pain (pain score 4-6).   lisinopril  (ZESTRIL ) 20 MG tablet Take 2 tablets (40 mg total) by mouth daily.   methocarbamol  (ROBAXIN ) 500 MG tablet Take 1 tablet (500 mg total) by mouth every 6 (six) hours as needed for muscle spasms.    Semaglutide -Weight Management (WEGOVY ) 0.5 MG/0.5ML SOAJ Inject 0.5 mg into the skin once a week.   Vitamin D , Ergocalciferol , (DRISDOL ) 1.25 MG (50000 UNIT) CAPS capsule Take 1 capsule (50,000 Units total) by mouth every 7 (seven) days.   No facility-administered encounter medications on file as of 08/18/2023.    Surgical History: Past Surgical History:  Procedure Laterality Date   ABDOMINAL HYSTERECTOMY     CARPAL TUNNEL RELEASE Bilateral    COLONOSCOPY     COLONOSCOPY WITH PROPOFOL  N/A 02/23/2019   Procedure: COLONOSCOPY WITH PROPOFOL ;  Surgeon: Therisa Bi, MD;  Location: [facility] ENDOSCOPY;  Service: Gastroenterology;  Laterality: N/A;   COLONOSCOPY WITH PROPOFOL  N/A 03/31/2022   Procedure: COLONOSCOPY WITH PROPOFOL ;  Surgeon: Therisa Bi, MD;  Location: [facility] ENDOSCOPY;  Service: Gastroenterology;  Laterality: N/A;    Medical History: Past Medical History:  Diagnosis Date   Asthma    Hypertension     Family History: Family History  Problem Relation Age of Onset   Breast cancer Mother 9   Breast cancer Cousin    Depression Sister    Anxiety disorder Sister     Social History   Socioeconomic History   Marital status: Married    Spouse name: keith   Number of children: 2   Years of education: Not on file   Highest education level: Associate degree: occupational, Scientist, product/process development, or vocational program  Occupational History   Not on file  Tobacco Use  Smoking status: Never   Smokeless tobacco: Never  Vaping Use   Vaping status: Never Used  Substance and Sexual Activity   Alcohol use: No   Drug use: No   Sexual activity: Yes  Other Topics Concern   Not on file  Social History Narrative   Not on file   Social Drivers of Health   Financial Resource Strain: Low Risk  (07/12/2018)   Overall Financial Resource Strain (CARDIA)    Difficulty of Paying Living Expenses: Not hard at all  Food Insecurity: No Food Insecurity (07/12/2018)   Hunger Vital Sign    Worried About  Running Out of Food in the Last Year: Never true    Ran Out of Food in the Last Year: Never true  Transportation Needs: No Transportation Needs (07/12/2018)   PRAPARE - Administrator, Civil Service (Medical): No    Lack of Transportation (Non-Medical): No  Physical Activity: Inactive (07/12/2018)   Exercise Vital Sign    Days of Exercise per Week: 0 days    Minutes of Exercise per Session: 0 min  Stress: No Stress Concern Present (07/12/2018)   [employer] of Occupational Health - Occupational Stress Questionnaire    Feeling of Stress : Not at all  Social Connections: Unknown (07/12/2018)   Social Connection and Isolation Panel [NHANES]    Frequency of Communication with Friends and Family: Not on file    Frequency of Social Gatherings with Friends and Family: Not on file    Attends Religious Services: More than 4 times per year    Active Member of [employer] or Organizations: No    Attends Banker Meetings: Never    Marital Status: Married  Catering manager Violence: Not At Risk (07/12/2018)   Humiliation, Afraid, Rape, and Kick questionnaire    Fear of Current or Ex-Partner: No    Emotionally Abused: No    Physically Abused: No    Sexually Abused: No      Review of Systems  Constitutional:  Positive for fatigue and unexpected weight change. Negative for chills.  HENT:  Negative for congestion, rhinorrhea, sneezing and sore throat.   Eyes:  Negative for redness.  Respiratory: Negative.  Negative for cough, chest tightness, shortness of breath and wheezing.   Cardiovascular: Negative.  Negative for chest pain and palpitations.  Gastrointestinal:  Negative for abdominal pain, constipation, diarrhea, nausea and vomiting.  Genitourinary:  Negative for dysuria and frequency.  Musculoskeletal:  Negative for arthralgias, back pain, joint swelling and neck pain.  Skin:  Negative for rash.  Neurological: Negative.  Negative for tremors and numbness.   Hematological:  Negative for adenopathy. Does not bruise/bleed easily.  Psychiatric/Behavioral:  Positive for behavioral problems (Depression). Negative for self-injury, sleep disturbance and suicidal ideas. The patient is nervous/anxious.        Hypersomnia    Vital Signs: BP 138/88 Comment: 170/94  Pulse 81   Temp 98 F (36.7 C)   Resp 16   Ht 5' 5 (1.651 m)   Wt (!) 306 lb 6.4 oz (139 kg)   SpO2 97%   BMI 50.99 kg/m    Physical Exam Vitals reviewed.  Constitutional:      General: She is not in acute distress.    Appearance: Normal appearance. She is obese. She is not ill-appearing.  HENT:     Head: Normocephalic and atraumatic.  Eyes:     Pupils: Pupils are equal, round, and reactive to light.  Cardiovascular:  Rate and Rhythm: Normal rate and regular rhythm.  Pulmonary:     Effort: Pulmonary effort is normal. No respiratory distress.  Neurological:     Mental Status: She is alert and oriented to person, place, and time.     Gait: Gait normal.  Psychiatric:        Mood and Affect: Mood normal.        Behavior: Behavior normal.        Assessment/Plan: 1. Primary hypertension (Primary) Stable, Continue amlodipine  and lisinopril  as prescribed.   2. Prediabetes Decrease high carb and high sugar foods in diet. Informational handouts given to patient at checkout today  3. Vitamin D  deficiency Start weekly vitamin D  supplement as prescribed.  - Vitamin D , Ergocalciferol , (DRISDOL ) 1.25 MG (50000 UNIT) CAPS capsule; Take 1 capsule (50,000 Units total) by mouth every 7 (seven) days.  Dispense: 12 capsule; Refill: 1  4. Morbid obesity with BMI of 50.0-59.9, adult (HCC) Started weight watchers, will order wegovy  or zepbound at next office visit. Patient will work with just [employer] watchers for a few months    General Counseling: Kaytlan verbalizes understanding of the findings of todays visit and agrees with plan of treatment. I have discussed any further  diagnostic evaluation that may be needed or ordered today. We also reviewed her medications today. she has been encouraged to call the office with any questions or concerns that should arise related to todays visit.    No orders of the defined types were placed in this encounter.   Meds ordered this encounter  Medications   Vitamin D , Ergocalciferol , (DRISDOL ) 1.25 MG (50000 UNIT) CAPS capsule    Sig: Take 1 capsule (50,000 Units total) by mouth every 7 (seven) days.    Dispense:  12 capsule    Refill:  1    Fill new script today    Return in about 2 months (around 10/18/2023) for F/U, eval new med, Davelle Anselmi PCP, Weight loss.   Total time spent:30 Minutes Time spent includes review of chart, medications, test results, and follow up plan with the patient.   [place] Controlled Substance Database was reviewed by me.  This patient was seen by Mardy Maxin, FNP-C in collaboration with Dr. Sigrid Bathe as a part of collaborative care agreement.   Aislin Onofre R. Maxin, MSN, FNP-C Internal medicine

## 2023-09-07 ENCOUNTER — Other Ambulatory Visit: Payer: Self-pay | Admitting: Nurse Practitioner

## 2023-09-07 DIAGNOSIS — I10 Essential (primary) hypertension: Secondary | ICD-10-CM

## 2023-09-07 NOTE — Telephone Encounter (Signed)
 Please review

## 2023-09-13 ENCOUNTER — Other Ambulatory Visit: Payer: Self-pay | Admitting: Nurse Practitioner

## 2023-09-13 DIAGNOSIS — I10 Essential (primary) hypertension: Secondary | ICD-10-CM

## 2023-09-26 ENCOUNTER — Encounter: Payer: Self-pay | Admitting: Nurse Practitioner

## 2023-09-26 DIAGNOSIS — E559 Vitamin D deficiency, unspecified: Secondary | ICD-10-CM | POA: Insufficient documentation

## 2023-09-26 DIAGNOSIS — R7303 Prediabetes: Secondary | ICD-10-CM | POA: Insufficient documentation

## 2023-10-20 ENCOUNTER — Ambulatory Visit: Admitting: Nurse Practitioner

## 2023-10-20 ENCOUNTER — Encounter: Payer: Self-pay | Admitting: Nurse Practitioner

## 2023-10-20 VITALS — BP 179/89 | HR 86 | Temp 98.4°F | Resp 16 | Ht 65.0 in | Wt 297.0 lb

## 2023-10-20 DIAGNOSIS — Z6841 Body Mass Index (BMI) 40.0 and over, adult: Secondary | ICD-10-CM

## 2023-10-20 DIAGNOSIS — R7303 Prediabetes: Secondary | ICD-10-CM | POA: Diagnosis not present

## 2023-10-20 DIAGNOSIS — F411 Generalized anxiety disorder: Secondary | ICD-10-CM | POA: Insufficient documentation

## 2023-10-20 DIAGNOSIS — I10 Essential (primary) hypertension: Secondary | ICD-10-CM

## 2023-10-20 MED ORDER — LISINOPRIL 20 MG PO TABS: 20.0000 mg | ORAL_TABLET | Freq: Every day | ORAL | 3 refills | Status: DC

## 2023-10-20 NOTE — Progress Notes (Signed)
 [facility] [address] [place], [place] [address]  Internal MEDICINE  Office Visit Note  Patient Name: April George  [id number]  [id number]  Date of Service: 10/20/2023  Chief Complaint  Patient presents with   Hypertension   Follow-up    HPI April George presents for a follow-up visit for hypertension, weight loss, anxiety and low vitamin D . Hypertension -- BP is elevated but she ran out of medication due to pharmacy switching issues and lack of communication from the pharmacy. Lisinopril  needs to be sent to [facility] as a 3 month supply in order for them to fill it with her insurance.  Weight loss -- down 9 lbs since last office visit, on samples of wegovy , waiting for approval, has joined weight watchers twice but this has not registered with her insurance so she will call and figure what to do so that they will approve the wegovy  and she will receive another sample today. Anxiety -- patient is taking duloxetine  which is effective Low vitamin D  -- taking a weekly prescription vitamin D  supplement.     Current Medication: Outpatient Encounter Medications as of 10/20/2023  Medication Sig   albuterol  (VENTOLIN  HFA) 108 (90 Base) MCG/ACT inhaler Inhale 2 puffs into the lungs every 6 (six) hours as needed for wheezing or shortness of breath.   amLODipine  (NORVASC ) 5 MG tablet Take 1 tablet (5 mg total) by mouth daily.   busPIRone  (BUSPAR ) 5 MG tablet Take 1 tablet (5 mg total) by mouth 3 (three) times daily as needed (anxiety).   desloratadine  (CLARINEX  REDITAB) 5 MG disintegrating tablet Take 1 tablet (5 mg total) by mouth daily as needed.   diclofenac  (VOLTAREN ) 75 MG EC tablet Take 1 tablet by mouth twice daily   DULoxetine  (CYMBALTA ) 60 MG capsule Take 1 capsule (60 mg total) by mouth daily.   fluticasone  (FLONASE ) 50 MCG/ACT nasal spray Place 2 sprays into both nostrils daily.   HYDROcodone -acetaminophen  (NORCO/VICODIN) 5-325 MG tablet Take 1 tablet by mouth 2 (two) times  daily as needed for moderate pain (pain score 4-6).   lisinopril  (ZESTRIL ) 20 MG tablet Take 1 tablet (20 mg total) by mouth daily.   methocarbamol  (ROBAXIN ) 500 MG tablet Take 1 tablet (500 mg total) by mouth every 6 (six) hours as needed for muscle spasms.   Semaglutide -Weight Management (WEGOVY ) 0.5 MG/0.5ML SOAJ Inject 0.5 mg into the skin once a week.   Vitamin D , Ergocalciferol , (DRISDOL ) 1.25 MG (50000 UNIT) CAPS capsule Take 1 capsule (50,000 Units total) by mouth every 7 (seven) days.   [DISCONTINUED] lisinopril  (ZESTRIL ) 20 MG tablet TAKE 2 TABLETS BY MOUTH ONCE DAIL   No facility-administered encounter medications on file as of 10/20/2023.    Surgical History: Past Surgical History:  Procedure Laterality Date   ABDOMINAL HYSTERECTOMY     CARPAL TUNNEL RELEASE Bilateral    COLONOSCOPY     COLONOSCOPY WITH PROPOFOL  N/A 02/23/2019   Procedure: COLONOSCOPY WITH PROPOFOL ;  Surgeon: Therisa Bi, MD;  Location: [facility] ENDOSCOPY;  Service: Gastroenterology;  Laterality: N/A;   COLONOSCOPY WITH PROPOFOL  N/A 03/31/2022   Procedure: COLONOSCOPY WITH PROPOFOL ;  Surgeon: Therisa Bi, MD;  Location: [facility] ENDOSCOPY;  Service: Gastroenterology;  Laterality: N/A;    Medical History: Past Medical History:  Diagnosis Date   Asthma    Hypertension     Family History: Family History  Problem Relation Age of Onset   Breast cancer Mother 17   Breast cancer Cousin    Depression Sister    Anxiety  disorder Sister     Social History   Socioeconomic History   Marital status: Married    Spouse name: keith   Number of children: 2   Years of education: Not on file   Highest education level: Associate degree: occupational, Scientist, product/process development, or vocational program  Occupational History   Not on file  Tobacco Use   Smoking status: Never   Smokeless tobacco: Never  Vaping Use   Vaping status: Never Used  Substance and Sexual Activity   Alcohol use: No   Drug use: No   Sexual activity: Yes  Other  Topics Concern   Not on file  Social History Narrative   Not on file   Social Drivers of Health   Financial Resource Strain: Low Risk  (07/12/2018)   Overall Financial Resource Strain (CARDIA)    Difficulty of Paying Living Expenses: Not hard at all  Food Insecurity: No Food Insecurity (07/12/2018)   Hunger Vital Sign    Worried About Running Out of Food in the Last Year: Never true    Ran Out of Food in the Last Year: Never true  Transportation Needs: No Transportation Needs (07/12/2018)   PRAPARE - Administrator, Civil Service (Medical): No    Lack of Transportation (Non-Medical): No  Physical Activity: Inactive (07/12/2018)   Exercise Vital Sign    Days of Exercise per Week: 0 days    Minutes of Exercise per Session: 0 min  Stress: No Stress Concern Present (07/12/2018)   [employer] of Occupational Health - Occupational Stress Questionnaire    Feeling of Stress : Not at all  Social Connections: Unknown (07/12/2018)   Social Connection and Isolation Panel    Frequency of Communication with Friends and Family: Not on file    Frequency of Social Gatherings with Friends and Family: Not on file    Attends Religious Services: More than 4 times per year    Active Member of [employer] or Organizations: No    Attends Banker Meetings: Never    Marital Status: Married  Catering manager Violence: Not At Risk (07/12/2018)   Humiliation, Afraid, Rape, and Kick questionnaire    Fear of Current or Ex-Partner: No    Emotionally Abused: No    Physically Abused: No    Sexually Abused: No      Review of Systems  Constitutional:  Positive for fatigue and unexpected weight change. Negative for chills.  HENT:  Negative for congestion, rhinorrhea, sneezing and sore throat.   Eyes:  Negative for redness.  Respiratory: Negative.  Negative for cough, chest tightness, shortness of breath and wheezing.   Cardiovascular: Negative.  Negative for chest pain and palpitations.   Gastrointestinal:  Negative for abdominal pain, constipation, diarrhea, nausea and vomiting.  Genitourinary:  Negative for dysuria and frequency.  Musculoskeletal:  Negative for arthralgias, back pain, joint swelling and neck pain.  Skin:  Negative for rash.  Neurological: Negative.  Negative for tremors and numbness.  Hematological:  Negative for adenopathy. Does not bruise/bleed easily.  Psychiatric/Behavioral:  Positive for behavioral problems (Depression). Negative for self-injury, sleep disturbance and suicidal ideas. The patient is nervous/anxious.        Hypersomnia    Vital Signs: BP (!) 179/89   Pulse 86   Temp 98.4 F (36.9 C)   Resp 16   Ht 5' 5 (1.651 m)   Wt 297 lb (134.7 kg)   SpO2 94%   BMI 49.42 kg/m    Physical Exam Vitals  reviewed.  Constitutional:      General: She is not in acute distress.    Appearance: Normal appearance. She is obese. She is not ill-appearing.  HENT:     Head: Normocephalic and atraumatic.  Eyes:     Pupils: Pupils are equal, round, and reactive to light.  Cardiovascular:     Rate and Rhythm: Normal rate and regular rhythm.  Pulmonary:     Effort: Pulmonary effort is normal. No respiratory distress.  Neurological:     Mental Status: She is alert and oriented to person, place, and time.     Gait: Gait normal.  Psychiatric:        Mood and Affect: Mood normal.        Behavior: Behavior normal.        Assessment/Plan: 1. Primary hypertension (Primary) Elevated blood pressure, blood pressure medications sent to her pharmacy of choice. Will recheck her blood pressure at her follow up visit.  - lisinopril  (ZESTRIL ) 20 MG tablet; Take 1 tablet (20 mg total) by mouth daily.  Dispense: 180 tablet; Refill: 3  2. Prediabetes Continue diet modifications as previously discussed.   3. Morbid obesity with BMI of 45.0-49.9, adult (HCC) Continue wegovy , sample given to patient while she waits for her insurance to approve it.   4.  Generalized anxiety disorder Continue buspirone  and duloxetine  as prescribed.    General Counseling: asya derryberry understanding of the findings of todays visit and agrees with plan of treatment. I have discussed any further diagnostic evaluation that may be needed or ordered today. We also reviewed her medications today. she has been encouraged to call the office with any questions or concerns that should arise related to todays visit.    No orders of the defined types were placed in this encounter.   Meds ordered this encounter  Medications   lisinopril  (ZESTRIL ) 20 MG tablet    Sig: Take 1 tablet (20 mg total) by mouth daily.    Dispense:  180 tablet    Refill:  3    Fill new script today, patient is out of medication    Return in about 2 months (around 12/21/2023) for F/U, Alexsa Flaum PCP.   Total time spent:30 Minutes Time spent includes review of chart, medications, test results, and follow up plan with the patient.   [place] Controlled Substance Database was reviewed by me.  This patient was seen by Mardy Maxin, FNP-C in collaboration with Dr. Sigrid Bathe as a part of collaborative care agreement.   Eudora Guevarra R. Maxin, MSN, FNP-C Internal medicine

## 2023-10-21 ENCOUNTER — Encounter: Payer: Self-pay | Admitting: Nurse Practitioner

## 2023-12-21 ENCOUNTER — Ambulatory Visit (INDEPENDENT_AMBULATORY_CARE_PROVIDER_SITE_OTHER): Admitting: Nurse Practitioner

## 2023-12-21 ENCOUNTER — Encounter: Payer: Self-pay | Admitting: Nurse Practitioner

## 2023-12-21 VITALS — BP 135/80 | HR 83 | Temp 98.3°F | Resp 16 | Ht 65.0 in | Wt 303.8 lb

## 2023-12-21 DIAGNOSIS — E559 Vitamin D deficiency, unspecified: Secondary | ICD-10-CM | POA: Diagnosis not present

## 2023-12-21 DIAGNOSIS — Z6841 Body Mass Index (BMI) 40.0 and over, adult: Secondary | ICD-10-CM

## 2023-12-21 DIAGNOSIS — Z23 Encounter for immunization: Secondary | ICD-10-CM | POA: Diagnosis not present

## 2023-12-21 DIAGNOSIS — I10 Essential (primary) hypertension: Secondary | ICD-10-CM | POA: Diagnosis not present

## 2023-12-21 DIAGNOSIS — M170 Bilateral primary osteoarthritis of knee: Secondary | ICD-10-CM

## 2023-12-21 DIAGNOSIS — J309 Allergic rhinitis, unspecified: Secondary | ICD-10-CM

## 2023-12-21 DIAGNOSIS — F411 Generalized anxiety disorder: Secondary | ICD-10-CM

## 2023-12-21 DIAGNOSIS — Z0001 Encounter for general adult medical examination with abnormal findings: Secondary | ICD-10-CM

## 2023-12-21 DIAGNOSIS — G8929 Other chronic pain: Secondary | ICD-10-CM

## 2023-12-21 DIAGNOSIS — E66813 Obesity, class 3: Secondary | ICD-10-CM

## 2023-12-21 DIAGNOSIS — M5441 Lumbago with sciatica, right side: Secondary | ICD-10-CM

## 2023-12-21 MED ORDER — BUSPIRONE HCL 5 MG PO TABS: 5.0000 mg | ORAL_TABLET | Freq: Three times a day (TID) | ORAL | 2 refills | Status: AC | PRN

## 2023-12-21 MED ORDER — PNEUMOCOCCAL 20-VAL CONJ VACC 0.5 ML IM SUSY: 0.5000 mL | PREFILLED_SYRINGE | Freq: Once | INTRAMUSCULAR | 0 refills | Status: AC | PRN

## 2023-12-21 MED ORDER — DICLOFENAC SODIUM 75 MG PO TBEC: 75.0000 mg | DELAYED_RELEASE_TABLET | Freq: Two times a day (BID) | ORAL | 3 refills | Status: AC

## 2023-12-21 MED ORDER — WEGOVY 0.5 MG/0.5ML ~~LOC~~ SOAJ: 0.5000 mg | SUBCUTANEOUS | 5 refills | Status: AC

## 2023-12-21 MED ORDER — LISINOPRIL 20 MG PO TABS: 20.0000 mg | ORAL_TABLET | Freq: Every day | ORAL | 3 refills | Status: AC

## 2023-12-21 MED ORDER — VITAMIN D (ERGOCALCIFEROL) 1.25 MG (50000 UNIT) PO CAPS: 50000.0000 [IU] | ORAL_CAPSULE | ORAL | 1 refills | Status: AC

## 2023-12-21 MED ORDER — AMLODIPINE BESYLATE 5 MG PO TABS: 5.0000 mg | ORAL_TABLET | Freq: Every day | ORAL | 1 refills | Status: AC

## 2023-12-21 MED ORDER — ZOSTER VAC RECOMB ADJUVANTED 50 MCG/0.5ML IM SUSR: 0.5000 mL | Freq: Once | INTRAMUSCULAR | 1 refills | Status: AC | PRN

## 2023-12-21 MED ORDER — METHOCARBAMOL 500 MG PO TABS: 500.0000 mg | ORAL_TABLET | Freq: Four times a day (QID) | ORAL | 1 refills | Status: AC | PRN

## 2023-12-21 MED ORDER — FLUTICASONE PROPIONATE 50 MCG/ACT NA SUSP: 2.0000 | Freq: Every day | NASAL | 3 refills | Status: AC

## 2023-12-21 NOTE — Progress Notes (Signed)
 [facility] [address] [place], [place] [address]  Internal MEDICINE  Office Visit Note  Patient Name: April George  [id number]  [id number]  Date of Service: 12/21/2023  Chief Complaint  Patient presents with   Hypertension   Follow-up    HPI April George presents for a follow-up visit for hypertension, weight loss, chronic joint and back pains, allergic rhinitis, low vitamin D , anxiety and vaccinations.  Hypertension -- controlled with amlodipine  and lisinopril  as prescribed.  Weight loss -- joined [employer] and is getting wegovy  through this program  Chronic joint pains -- takes diclofenac  and methocarbamol  which is effective.  Due for shingles and pneumonia vaccines Requesting flu vaccine today.  Takes fluticasone  nasal spray for allergic rhinitis  Low vitamin D  -- takes weekly supplement Anxiety -- takes buspirone  which helps decrease her anxiety     Current Medication: Outpatient Encounter Medications as of 12/21/2023  Medication Sig   pneumococcal 20-valent conjugate vaccine (PREVNAR 20) 0.5 ML injection Inject 0.5 mLs into the muscle once as needed for up to 1 dose for immunization.   Zoster Vaccine Adjuvanted [facility]) injection Inject 0.5 mLs into the muscle once as needed for up to 1 dose (vaccination).   [DISCONTINUED] DTaP-IPV (KINRIX) injection Inject 0.5 mLs into the muscle once.   albuterol  (VENTOLIN  HFA) 108 (90 Base) MCG/ACT inhaler Inhale 2 puffs into the lungs every 6 (six) hours as needed for wheezing or shortness of breath.   amLODipine  (NORVASC ) 5 MG tablet Take 1 tablet (5 mg total) by mouth daily.   busPIRone  (BUSPAR ) 5 MG tablet Take 1 tablet (5 mg total) by mouth 3 (three) times daily as needed (anxiety).   desloratadine  (CLARINEX  REDITAB) 5 MG disintegrating tablet Take 1 tablet (5 mg total) by mouth daily as needed.   diclofenac  (VOLTAREN ) 75 MG EC tablet Take 1 tablet (75 mg total) by mouth 2 (two) times daily.   DULoxetine  (CYMBALTA )  60 MG capsule Take 1 capsule (60 mg total) by mouth daily.   fluticasone  (FLONASE ) 50 MCG/ACT nasal spray Place 2 sprays into both nostrils daily.   HYDROcodone -acetaminophen  (NORCO/VICODIN) 5-325 MG tablet Take 1 tablet by mouth 2 (two) times daily as needed for moderate pain (pain score 4-6).   lisinopril  (ZESTRIL ) 20 MG tablet Take 1 tablet (20 mg total) by mouth daily.   methocarbamol  (ROBAXIN ) 500 MG tablet Take 1 tablet (500 mg total) by mouth every 6 (six) hours as needed for muscle spasms.   semaglutide -weight management (WEGOVY ) 0.5 MG/0.5ML SOAJ SQ injection Inject 0.5 mg into the skin once a week.   Vitamin D , Ergocalciferol , (DRISDOL ) 1.25 MG (50000 UNIT) CAPS capsule Take 1 capsule (50,000 Units total) by mouth every 7 (seven) days.   [DISCONTINUED] amLODipine  (NORVASC ) 5 MG tablet Take 1 tablet (5 mg total) by mouth daily.   [DISCONTINUED] busPIRone  (BUSPAR ) 5 MG tablet Take 1 tablet (5 mg total) by mouth 3 (three) times daily as needed (anxiety).   [DISCONTINUED] diclofenac  (VOLTAREN ) 75 MG EC tablet Take 1 tablet by mouth twice daily   [DISCONTINUED] fluticasone  (FLONASE ) 50 MCG/ACT nasal spray Place 2 sprays into both nostrils daily.   [DISCONTINUED] lisinopril  (ZESTRIL ) 20 MG tablet Take 1 tablet (20 mg total) by mouth daily.   [DISCONTINUED] methocarbamol  (ROBAXIN ) 500 MG tablet Take 1 tablet (500 mg total) by mouth every 6 (six) hours as needed for muscle spasms.   [DISCONTINUED] Semaglutide -Weight Management (WEGOVY ) 0.5 MG/0.5ML SOAJ Inject 0.5 mg into the skin once a week.   [  DISCONTINUED] Vitamin D , Ergocalciferol , (DRISDOL ) 1.25 MG (50000 UNIT) CAPS capsule Take 1 capsule (50,000 Units total) by mouth every 7 (seven) days.   No facility-administered encounter medications on file as of 12/21/2023.    Surgical History: Past Surgical History:  Procedure Laterality Date   ABDOMINAL HYSTERECTOMY     CARPAL TUNNEL RELEASE Bilateral    COLONOSCOPY     COLONOSCOPY WITH  PROPOFOL  N/A 02/23/2019   Procedure: COLONOSCOPY WITH PROPOFOL ;  Surgeon: Therisa Bi, MD;  Location: [facility] ENDOSCOPY;  Service: Gastroenterology;  Laterality: N/A;   COLONOSCOPY WITH PROPOFOL  N/A 03/31/2022   Procedure: COLONOSCOPY WITH PROPOFOL ;  Surgeon: Therisa Bi, MD;  Location: [facility] ENDOSCOPY;  Service: Gastroenterology;  Laterality: N/A;    Medical History: Past Medical History:  Diagnosis Date   Asthma    Hypertension     Family History: Family History  Problem Relation Age of Onset   Breast cancer Mother 22   Breast cancer Cousin    Depression Sister    Anxiety disorder Sister     Social History   Socioeconomic History   Marital status: Married    Spouse name: April George   Number of children: 2   Years of education: Not on file   Highest education level: Associate degree: occupational, scientist, product/process development, or vocational program  Occupational History   Not on file  Tobacco Use   Smoking status: Never   Smokeless tobacco: Never  Vaping Use   Vaping status: Never Used  Substance and Sexual Activity   Alcohol use: No   Drug use: No   Sexual activity: Yes  Other Topics Concern   Not on file  Social History Narrative   Not on file   Social Drivers of Health   Financial Resource Strain: Low Risk  (07/12/2018)   Overall Financial Resource Strain (CARDIA)    Difficulty of Paying Living Expenses: Not hard at all  Food Insecurity: No Food Insecurity (07/12/2018)   Hunger Vital Sign    Worried About Running Out of Food in the Last Year: Never true    Ran Out of Food in the Last Year: Never true  Transportation Needs: No Transportation Needs (07/12/2018)   PRAPARE - Administrator, Civil Service (Medical): No    Lack of Transportation (Non-Medical): No  Physical Activity: Inactive (07/12/2018)   Exercise Vital Sign    Days of Exercise per Week: 0 days    Minutes of Exercise per Session: 0 min  Stress: No Stress Concern Present (07/12/2018)   [employer] of  Occupational Health - Occupational Stress Questionnaire    Feeling of Stress : Not at all  Social Connections: Unknown (07/12/2018)   Social Connection and Isolation Panel    Frequency of Communication with Friends and Family: Not on file    Frequency of Social Gatherings with Friends and Family: Not on file    Attends Religious Services: More than 4 times per year    Active Member of [employer] or Organizations: No    Attends Banker Meetings: Never    Marital Status: Married  Catering Manager Violence: Not At Risk (07/12/2018)   Humiliation, Afraid, Rape, and Kick questionnaire    Fear of Current or Ex-Partner: No    Emotionally Abused: No    Physically Abused: No    Sexually Abused: No      Review of Systems  Constitutional:  Positive for fatigue and unexpected weight change. Negative for activity change, appetite change, chills and fever.  HENT: Negative.  Negative for congestion, ear pain, rhinorrhea, sore throat and trouble swallowing.   Eyes: Negative.   Respiratory: Negative.  Negative for cough, chest tightness, shortness of breath and wheezing.   Cardiovascular: Negative.  Negative for chest pain and palpitations.  Gastrointestinal: Negative.  Negative for abdominal pain, blood in stool, constipation, diarrhea, nausea and vomiting.  Endocrine: Negative.   Genitourinary: Negative.  Negative for difficulty urinating, dysuria, frequency, hematuria and urgency.  Musculoskeletal: Negative.  Negative for arthralgias, back pain, joint swelling, myalgias and neck pain.  Skin: Negative.  Negative for rash and wound.  Allergic/Immunologic: Negative.  Negative for immunocompromised state.  Neurological: Negative.  Negative for dizziness, seizures, numbness and headaches.  Hematological: Negative.   Psychiatric/Behavioral: Negative.  Negative for behavioral problems, self-injury and suicidal ideas. The patient is not nervous/anxious.     Vital Signs: BP 135/80 Comment:  175/99  Pulse 83   Temp 98.3 F (36.8 C)   Resp 16   Ht 5' 5 (1.651 m)   Wt (!) 303 lb 12.8 oz (137.8 kg)   SpO2 98%   BMI 50.55 kg/m    Physical Exam Vitals reviewed.  Constitutional:      General: She is awake. She is not in acute distress.    Appearance: Normal appearance. She is well-developed and well-groomed. She is morbidly obese. She is not ill-appearing or diaphoretic.  HENT:     Head: Normocephalic and atraumatic.     Right Ear: Tympanic membrane, ear canal and external ear normal.     Left Ear: Tympanic membrane, ear canal and external ear normal.     Nose: Nose normal. No congestion or rhinorrhea.     Mouth/Throat:     Lips: Pink.     Mouth: Mucous membranes are moist.     Pharynx: Oropharynx is clear. Uvula midline. No oropharyngeal exudate or posterior oropharyngeal erythema.  Eyes:     General: Lids are normal. Vision grossly intact. Gaze aligned appropriately. No scleral icterus.       Right eye: No discharge.        Left eye: No discharge.     Extraocular Movements: Extraocular movements intact.     Conjunctiva/sclera: Conjunctivae normal.     Pupils: Pupils are equal, round, and reactive to light.     Funduscopic exam:    Right eye: Red reflex present.        Left eye: Red reflex present. Neck:     Thyroid: No thyromegaly.     Vascular: No JVD.     Trachea: Trachea and phonation normal. No tracheal deviation.  Cardiovascular:     Rate and Rhythm: Normal rate and regular rhythm.     Pulses: Normal pulses.     Heart sounds: Normal heart sounds, S1 normal and S2 normal. No murmur heard.    No friction rub. No gallop.  Pulmonary:     Effort: Pulmonary effort is normal. No accessory muscle usage or respiratory distress.     Breath sounds: Normal breath sounds and air entry. No stridor. No wheezing or rales.  Chest:     Chest wall: No tenderness.     Comments: Declined clinical breast exam Abdominal:     General: Bowel sounds are normal. There is no  distension.     Palpations: Abdomen is soft. There is no shifting dullness, fluid wave, mass or pulsatile mass.     Tenderness: There is no abdominal tenderness. There is no guarding or rebound.  Musculoskeletal:  General: No tenderness or deformity. Normal range of motion.     Cervical back: Normal range of motion and neck supple.     Right lower leg: No edema.     Left lower leg: No edema.  Lymphadenopathy:     Cervical: No cervical adenopathy.  Skin:    General: Skin is warm and dry.     Capillary Refill: Capillary refill takes less than 2 seconds.     Coloration: Skin is not pale.     Findings: No erythema or rash.  Neurological:     Mental Status: She is alert and oriented to person, place, and time.     Cranial Nerves: No cranial nerve deficit.     Motor: No abnormal muscle tone.     Coordination: Coordination normal.     Deep Tendon Reflexes: Reflexes are normal and symmetric.  Psychiatric:        Mood and Affect: Mood and affect normal.        Behavior: Behavior normal. Behavior is cooperative.        Thought Content: Thought content normal.        Judgment: Judgment normal.        Assessment/Plan: 1. Primary hypertension (Primary) Stable, continue amlodipine  and lisinopril  as prescribed  - amLODipine  (NORVASC ) 5 MG tablet; Take 1 tablet (5 mg total) by mouth daily.  Dispense: 90 tablet; Refill: 1 - lisinopril  (ZESTRIL ) 20 MG tablet; Take 1 tablet (20 mg total) by mouth daily.  Dispense: 180 tablet; Refill: 3  2. Chronic right-sided low back pain with right-sided sciatica Continue diclofenac  and methocarbamol  as prescribed.  - diclofenac  (VOLTAREN ) 75 MG EC tablet; Take 1 tablet (75 mg total) by mouth 2 (two) times daily.  Dispense: 180 tablet; Refill: 3 - methocarbamol  (ROBAXIN ) 500 MG tablet; Take 1 tablet (500 mg total) by mouth every 6 (six) hours as needed for muscle spasms.  Dispense: 90 tablet; Refill: 1  3. Primary osteoarthritis of both  knees Continue diclofenac  and methocarbamol  as prescribed  - diclofenac  (VOLTAREN ) 75 MG EC tablet; Take 1 tablet (75 mg total) by mouth 2 (two) times daily.  Dispense: 180 tablet; Refill: 3 - methocarbamol  (ROBAXIN ) 500 MG tablet; Take 1 tablet (500 mg total) by mouth every 6 (six) hours as needed for muscle spasms.  Dispense: 90 tablet; Refill: 1  4. Vitamin D  deficiency Continue weekly vitamin D  supplement as prescribed  - Vitamin D , Ergocalciferol , (DRISDOL ) 1.25 MG (50000 UNIT) CAPS capsule; Take 1 capsule (50,000 Units total) by mouth every 7 (seven) days.  Dispense: 12 capsule; Refill: 1  5. Chronic allergic rhinitis Continue fluticasone  as prescribed  - fluticasone  (FLONASE ) 50 MCG/ACT nasal spray; Place 2 sprays into both nostrils daily.  Dispense: 48 g; Refill: 3  6. Class 3 severe obesity due to excess calories with serious comorbidity and body mass index (BMI) of 45.0 to 49.9 in adult Wegovy  reordered  - semaglutide -weight management (WEGOVY ) 0.5 MG/0.5ML SOAJ SQ injection; Inject 0.5 mg into the skin once a week.  Dispense: 2 mL; Refill: 5  7. Need for vaccination Flu vaccine administered in office today. Pneumonia and shingles vaccines ordered and sent to the pharmacy.  - Influenza, MDCK, trivalent, PF(Flucelvax egg-free) - pneumococcal 20-valent conjugate vaccine (PREVNAR 20) 0.5 ML injection; Inject 0.5 mLs into the muscle once as needed for up to 1 dose for immunization.  Dispense: 0.5 mL; Refill: 0 - Zoster Vaccine Adjuvanted [facility]) injection; Inject 0.5 mLs into the muscle once as needed for  up to 1 dose (vaccination).  Dispense: 0.5 mL; Refill: 1  8. Generalized anxiety disorder Continue buspirone  as prescribed  - busPIRone  (BUSPAR ) 5 MG tablet; Take 1 tablet (5 mg total) by mouth 3 (three) times daily as needed (anxiety).  Dispense: 60 tablet; Refill: 2   General Counseling: Raniya verbalizes understanding of the findings of todays visit and agrees with plan of  treatment. I have discussed any further diagnostic evaluation that may be needed or ordered today. We also reviewed her medications today. she has been encouraged to call the office with any questions or concerns that should arise related to todays visit.    Orders Placed This Encounter  Procedures   Influenza, MDCK, trivalent, PF(Flucelvax egg-free)    Meds ordered this encounter  Medications   semaglutide -weight management (WEGOVY ) 0.5 MG/0.5ML SOAJ SQ injection    Sig: Inject 0.5 mg into the skin once a week.    Dispense:  2 mL    Refill:  5    Dx code E66.01, fill new script, send PA request if required   pneumococcal 20-valent conjugate vaccine (PREVNAR 20) 0.5 ML injection    Sig: Inject 0.5 mLs into the muscle once as needed for up to 1 dose for immunization.    Dispense:  0.5 mL    Refill:  0    Due for prevnar 20   Zoster Vaccine Adjuvanted [facility]) injection    Sig: Inject 0.5 mLs into the muscle once as needed for up to 1 dose (vaccination).    Dispense:  0.5 mL    Refill:  1    Due for 2 dose series   diclofenac  (VOLTAREN ) 75 MG EC tablet    Sig: Take 1 tablet (75 mg total) by mouth 2 (two) times daily.    Dispense:  180 tablet    Refill:  3   amLODipine  (NORVASC ) 5 MG tablet    Sig: Take 1 tablet (5 mg total) by mouth daily.    Dispense:  90 tablet    Refill:  1   methocarbamol  (ROBAXIN ) 500 MG tablet    Sig: Take 1 tablet (500 mg total) by mouth every 6 (six) hours as needed for muscle spasms.    Dispense:  90 tablet    Refill:  1    New script, fill today.   Vitamin D , Ergocalciferol , (DRISDOL ) 1.25 MG (50000 UNIT) CAPS capsule    Sig: Take 1 capsule (50,000 Units total) by mouth every 7 (seven) days.    Dispense:  12 capsule    Refill:  1    Fill new script today   fluticasone  (FLONASE ) 50 MCG/ACT nasal spray    Sig: Place 2 sprays into both nostrils daily.    Dispense:  48 g    Refill:  3   busPIRone  (BUSPAR ) 5 MG tablet    Sig: Take 1 tablet (5 mg  total) by mouth 3 (three) times daily as needed (anxiety).    Dispense:  60 tablet    Refill:  2   lisinopril  (ZESTRIL ) 20 MG tablet    Sig: Take 1 tablet (20 mg total) by mouth daily.    Dispense:  180 tablet    Refill:  3    For future refills    Return in about 3 months (around 03/21/2024) for F/U, [facility] [place] PCP.   Total time spent:30 Minutes Time spent includes review of chart, medications, test results, and follow up plan with the patient.   [place] Controlled Substance Database  was reviewed by me.  This patient was seen by Mardy Maxin, FNP-C in collaboration with Dr. Sigrid Bathe as a part of collaborative care agreement.   Charels Stambaugh R. Maxin, MSN, FNP-C Internal medicine

## 2024-01-21 ENCOUNTER — Encounter: Payer: Self-pay | Admitting: Nurse Practitioner

## 2024-01-21 DIAGNOSIS — G8929 Other chronic pain: Secondary | ICD-10-CM | POA: Insufficient documentation

## 2024-03-21 ENCOUNTER — Ambulatory Visit: Admitting: Nurse Practitioner

## 2024-07-06 ENCOUNTER — Encounter: Admitting: Nurse Practitioner
